# Patient Record
Sex: Female | Born: 1995 | Race: Black or African American | Hispanic: No | Marital: Married | Smoking: Never smoker
Health system: Southern US, Community
[De-identification: ages and names within clinical notes are randomized; demographics above are authoritative.]

## PROBLEM LIST (undated history)

## (undated) DIAGNOSIS — G35 Multiple sclerosis: Secondary | ICD-10-CM

## (undated) DIAGNOSIS — M419 Scoliosis, unspecified: Secondary | ICD-10-CM

## (undated) DIAGNOSIS — M722 Plantar fascial fibromatosis: Secondary | ICD-10-CM

## (undated) HISTORY — DX: Multiple sclerosis: G35

## (undated) HISTORY — PX: NO PAST SURGERIES: SHX2092

## (undated) HISTORY — DX: Plantar fascial fibromatosis: M72.2

## (undated) HISTORY — DX: Scoliosis, unspecified: M41.9

---
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
## 2015-01-15 ENCOUNTER — Emergency Department (HOSPITAL_COMMUNITY): Payer: Self-pay

## 2015-01-15 ENCOUNTER — Encounter (HOSPITAL_COMMUNITY): Payer: Self-pay | Admitting: Nurse Practitioner

## 2015-01-15 ENCOUNTER — Emergency Department (HOSPITAL_COMMUNITY)
Admission: EM | Admit: 2015-01-15 | Discharge: 2015-01-15 | Disposition: A | Discharge status: Home / Self Care | Payer: Self-pay | Attending: Emergency Medicine | Admitting: Emergency Medicine

## 2015-01-15 DIAGNOSIS — Z3202 Encounter for pregnancy test, result negative: Secondary | ICD-10-CM | POA: Insufficient documentation

## 2015-01-15 DIAGNOSIS — R109 Unspecified abdominal pain: Secondary | ICD-10-CM

## 2015-01-15 DIAGNOSIS — N390 Urinary tract infection, site not specified: Secondary | ICD-10-CM | POA: Insufficient documentation

## 2015-01-15 LAB — WET PREP, GENITAL
Clue Cells Wet Prep HPF POC: NONE SEEN
Trich, Wet Prep: NONE SEEN
Yeast Wet Prep HPF POC: NONE SEEN

## 2015-01-15 LAB — URINALYSIS, ROUTINE W REFLEX MICROSCOPIC
Bilirubin Urine: NEGATIVE
Glucose, UA: NEGATIVE mg/dL
Ketones, ur: NEGATIVE mg/dL
Leukocytes, UA: NEGATIVE
Nitrite: NEGATIVE
Protein, ur: NEGATIVE mg/dL
Specific Gravity, Urine: 1.03 (ref 1.005–1.030)
Urobilinogen, UA: 0.2 mg/dL (ref 0.0–1.0)
pH: 5.5 (ref 5.0–8.0)

## 2015-01-15 LAB — CBC
HCT: 39.9 % (ref 36.0–46.0)
Hemoglobin: 13.1 g/dL (ref 12.0–15.0)
MCH: 28 pg (ref 26.0–34.0)
MCHC: 32.8 g/dL (ref 30.0–36.0)
MCV: 85.3 fL (ref 78.0–100.0)
Platelets: 322 10*3/uL (ref 150–400)
RBC: 4.68 MIL/uL (ref 3.87–5.11)
RDW: 13.1 % (ref 11.5–15.5)
WBC: 6.1 10*3/uL (ref 4.0–10.5)

## 2015-01-15 LAB — URINE MICROSCOPIC-ADD ON

## 2015-01-15 LAB — COMPREHENSIVE METABOLIC PANEL
ALT: 12 U/L — ABNORMAL LOW (ref 14–54)
AST: 17 U/L (ref 15–41)
Albumin: 3.7 g/dL (ref 3.5–5.0)
Alkaline Phosphatase: 57 U/L (ref 38–126)
Anion gap: 3 — ABNORMAL LOW (ref 5–15)
BUN: 10 mg/dL (ref 6–20)
CO2: 28 mmol/L (ref 22–32)
Calcium: 9 mg/dL (ref 8.9–10.3)
Chloride: 107 mmol/L (ref 101–111)
Creatinine, Ser: 0.59 mg/dL (ref 0.44–1.00)
GFR calc Af Amer: >60 mL/min (ref >60)
GFR calc non Af Amer: >60 mL/min (ref >60)
Glucose, Bld: 99 mg/dL (ref 65–99)
Potassium: 3.8 mmol/L (ref 3.5–5.1)
Sodium: 138 mmol/L (ref 135–145)
Total Bilirubin: 0.6 mg/dL (ref 0.3–1.2)
Total Protein: 7.1 g/dL (ref 6.5–8.1)

## 2015-01-15 LAB — I-STAT BETA HCG BLOOD, ED (MC, WL, AP ONLY): I-stat hCG, quantitative: <5 m[IU]/mL (ref <5)

## 2015-01-15 MED ORDER — NITROFURANTOIN MONOHYD MACRO 100 MG PO CAPS: 100.0000 mg | ORAL_CAPSULE | Freq: Two times a day (BID) | ORAL | Status: DC

## 2015-01-15 MED ORDER — CIPROFLOXACIN HCL 500 MG PO TABS: 500.0000 mg | ORAL_TABLET | Freq: Two times a day (BID) | ORAL | Status: DC

## 2015-01-15 NOTE — ED Notes (Signed)
She c/o 1 week history of RLQ abd pain, nausea, diarrhea, urinary frequency and hesitancy, loss of appetite and white vaginal discharge.

## 2015-01-15 NOTE — ED Provider Notes (Signed)
CSN: [id number]     Arrival date & time 01/15/15  1147 History   First MD Initiated Contact with Patient 01/15/15 1436     Chief Complaint  Patient presents with  . Abdominal Pain     (Consider location/radiation/quality/duration/timing/severity/associated sxs/prior Treatment) HPI  History reviewed. No pertinent past medical history. History reviewed. No pertinent past surgical history. History reviewed. No pertinent family history. Social History  Substance Use Topics  . Smoking status: Never Smoker   . Smokeless tobacco: None  . Alcohol Use: No   OB History    No data available     Review of Systems    Allergies  Review of patient's allergies indicates no known allergies.  Home Medications   Prior to Admission medications   Not on File   BP 134/80 mmHg  Pulse 82  Temp(Src) 98.3 F (36.8 C) (Oral)  Resp 18  Ht  (1.626 m)  Wt 185 lb (83.915 kg)  BMI 31.74 kg/m2  SpO2 100%  LMP 01/07/2015 Physical Exam  ED Course  Procedures (including critical care time) Labs Review Labs Reviewed  COMPREHENSIVE METABOLIC PANEL - Abnormal; Notable for the following:    ALT 12 (*)    Anion gap 3 (*)    All other components within normal limits  CBC  URINALYSIS, ROUTINE W REFLEX MICROSCOPIC (NOT AT [facility])  I-STAT BETA HCG BLOOD, ED ([facility], [facility], [facility] ONLY)    Imaging Review No results found. I have personally reviewed and evaluated these images and lab results as part of my medical decision-making.   EKG Interpretation None      MDM   Final diagnoses:  None    Please delete this note I had no encounter with this patient    Doug Sou, MD 01/15/15 1655

## 2015-01-15 NOTE — ED Notes (Signed)
Denies fever but states she has been getting hot lately.  States she also has had some diarrhea

## 2015-01-15 NOTE — ED Provider Notes (Signed)
CSN: [id number]     Arrival date & time 01/15/15  1147 History   First MD Initiated Contact with Patient 01/15/15 1436     Chief Complaint  Patient presents with  . Abdominal Pain   HPI   19 YOF presents with right sided abdominal flank pain. Patient reports the symptoms have been present for the last week. She describes the pain as sharp and intermittent. She reports the pain is worse with walking. Patient reports that her urine has been normal but notes that time she's had "difficulty" with urination, this is not persistent and is only occasional; denies painful urination. Patient reports she has been nauseous, vomited once today. Patient additionally notes that she's had increase in "white" vaginal discharge; no bleeding.. She reports her last normal menstrual cycle was approximately one week ago. Patient denies fever, chills, upper abdominal pain, chest pain, shortness of breath, lower abdominal pain, lower extremity swelling or edema. Patient reports she had an episode of diarrhea today  History reviewed. No pertinent past medical history. History reviewed. No pertinent past surgical history. History reviewed. No pertinent family history. Social History  Substance Use Topics  . Smoking status: Never Smoker   . Smokeless tobacco: None  . Alcohol Use: No   OB History    No data available     Review of Systems  All other systems reviewed and are negative.   Allergies  Review of patient's allergies indicates no known allergies.  Home Medications   Prior to Admission medications   Medication Sig Start Date End Date Taking? Authorizing Provider  ciprofloxacin (CIPRO) 500 MG tablet Take 1 tablet (500 mg total) by mouth every 12 (twelve) hours. 01/15/15   Flor Whitacre, PA-C   BP 117/65 mmHg  Pulse 83  Temp(Src) 98.3 F (36.8 C) (Oral)  Resp 18  Ht  (1.626 m)  Wt 185 lb (83.915 kg)  BMI 31.74 kg/m2  SpO2 100%  LMP 01/08/2015   Physical Exam  Constitutional: She is  oriented to person, place, and time. She appears well-developed and well-nourished.  Patient resting comfortably in exam bed in no acute distress  HENT:  Head: Normocephalic and atraumatic.  Eyes: Conjunctivae are normal. Pupils are equal, round, and reactive to light. Right eye exhibits no discharge. Left eye exhibits no discharge. No scleral icterus.  Neck: Normal range of motion. No JVD present. No tracheal deviation present.  Cardiovascular: Normal rate, regular rhythm, normal heart sounds and intact distal pulses.  Exam reveals no friction rub.   No murmur heard. Pulmonary/Chest: Effort normal and breath sounds normal. No stridor. No respiratory distress. She has no wheezes. She has no rales. She exhibits no tenderness.  Abdominal: Soft. Bowel sounds are normal. She exhibits no distension and no mass. There is no hepatosplenomegaly, splenomegaly or hepatomegaly. There is tenderness. There is no rigidity, no rebound, no guarding, no CVA tenderness, no tenderness at McBurney's point and negative Murphy's sign.  Mildly tender to deep palpation of the right mid abdomen and flank  Musculoskeletal: Normal range of motion. She exhibits no edema or tenderness.  Neurological: She is alert and oriented to person, place, and time. Coordination normal.  Skin: Skin is warm and dry.  Psychiatric: She has a normal mood and affect. Her behavior is normal. Judgment and thought content normal.  Nursing note and vitals reviewed.   ED Course  Procedures (including critical care time) Labs Review Labs Reviewed  WET PREP, GENITAL - Abnormal; Notable for the following:  WBC, Wet Prep HPF POC FEW (*)    All other components within normal limits  COMPREHENSIVE METABOLIC PANEL - Abnormal; Notable for the following:    ALT 12 (*)    Anion gap 3 (*)    All other components within normal limits  URINALYSIS, ROUTINE W REFLEX MICROSCOPIC (NOT AT [facility]) - Abnormal; Notable for the following:    APPearance CLOUDY  (*)    Hgb urine dipstick MODERATE (*)    All other components within normal limits  URINE MICROSCOPIC-ADD ON - Abnormal; Notable for the following:    Squamous Epithelial / LPF MANY (*)    Bacteria, UA MANY (*)    All other components within normal limits  URINE CULTURE  CBC  RPR  HIV ANTIBODY (ROUTINE TESTING)  I-STAT BETA HCG BLOOD, ED ([facility], [facility], [facility] ONLY)  GC/CHLAMYDIA PROBE AMP ([place]) NOT AT [facility]    Imaging Review Ct Renal Stone Study  01/15/2015   CLINICAL DATA:  RIGHT flank pain for 1 week. Initial encounter.  EXAM: CT ABDOMEN AND PELVIS WITHOUT CONTRAST  TECHNIQUE: Multidetector CT imaging of the abdomen and pelvis was performed following the standard protocol without IV contrast.  COMPARISON:  None.  FINDINGS: Lower chest: Lung bases are clear.  Hepatobiliary: No focal hepatic lesion. No biliary duct dilatation. Gallbladder is normal. Common bile duct is normal.  Pancreas: Pancreas is normal. No ductal dilatation. No pancreatic inflammation.  Spleen: Normal spleen.  Adrenals/urinary tract: Adrenal glands are normal. No nephrolithiasis or ureterolithiasis. No bladder calculi.  Stomach/Bowel: Stomach, small bowel, appendix, and cecum are normal. The colon and rectosigmoid colon are normal.  Vascular/Lymphatic: Abdominal aorta is normal caliber. There is no retroperitoneal or periportal lymphadenopathy. No pelvic lymphadenopathy.  Reproductive: Uterus and ovaries are normal.  Musculoskeletal: No aggressive osseous lesion. 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 No subluxation.  Other: No free fluid.  IMPRESSION: 1. No nephrolithiasis or ureterolithiasis. 2. Normal appendix.   Electronically Signed   By: Genevive Bi M.D.   On: 01/15/2015 16:17   I have personally reviewed and evaluated these images and lab results as part of my medical decision-making.   EKG Interpretation None      MDM   Final diagnoses:  Flank pain  UTI (lower urinary tract infection)    Labs: Urine  culture, RPR, HIV, wet, urinalysis, i-STAT beta hCG, CMP, CBC- hemoglobin in the urine moderate amount, many bacteria, small urine sample size.  Imaging: CT renal panel significant findings  Consults:  Therapeutics:  Discharge Meds: Cipro  Assessment/Plan: Patient's presentation unlikely to be significant intra-abdominal pathology. Patient passing stool, unlikely bowel obstruction, CT scan shows no signs of appendicitis, kidney stones. She is nontoxic, in no acute distress, vital signs stable, laboratory results stable. Urine shows large amounts of hemoglobin, many bacteria, patient will be prophylactically treated for urinary tract infection, she is encouraged to follow up with her primary care provider for reevaluation. She is given strict return precautions event new worsening signs or symptoms present. Patient verbalized understanding and agreement for today's plan.         Eyvonne Mechanic, PA-C 01/15/15 1835  Glynn Octave, MD 01/16/15 [phone]

## 2015-01-15 NOTE — ED Notes (Signed)
Pt returned from CT °

## 2015-01-15 NOTE — ED Notes (Signed)
Patient transported to CT 

## 2015-01-15 NOTE — Discharge Instructions (Signed)
Please use medication as directed. Please monitor for new or worsening signs or symptoms, return immediately if any present. Please follow-up with primary care provider for reevaluation.

## 2015-01-15 NOTE — ED Notes (Signed)
MD and phlebotomy at bedside

## 2015-01-16 LAB — RPR: RPR Ser Ql: NONREACTIVE

## 2015-01-16 LAB — HIV ANTIBODY (ROUTINE TESTING W REFLEX): HIV Screen 4th Generation wRfx: NONREACTIVE

## 2015-01-17 LAB — URINE CULTURE: Special Requests: NORMAL

## 2015-01-17 LAB — GC/CHLAMYDIA PROBE AMP (~~LOC~~) NOT AT ARMC
Chlamydia: NEGATIVE
Neisseria Gonorrhea: NEGATIVE

## 2015-03-27 ENCOUNTER — Encounter (HOSPITAL_COMMUNITY): Payer: Self-pay | Admitting: Emergency Medicine

## 2015-03-27 ENCOUNTER — Emergency Department (HOSPITAL_COMMUNITY)
Admission: EM | Admit: 2015-03-27 | Discharge: 2015-03-27 | Disposition: A | Discharge status: Home / Self Care | Payer: Self-pay | Attending: Emergency Medicine | Admitting: Emergency Medicine

## 2015-03-27 ENCOUNTER — Emergency Department (HOSPITAL_COMMUNITY): Payer: Self-pay

## 2015-03-27 DIAGNOSIS — R197 Diarrhea, unspecified: Secondary | ICD-10-CM | POA: Insufficient documentation

## 2015-03-27 DIAGNOSIS — R110 Nausea: Secondary | ICD-10-CM | POA: Insufficient documentation

## 2015-03-27 DIAGNOSIS — N83209 Unspecified ovarian cyst, unspecified side: Secondary | ICD-10-CM

## 2015-03-27 DIAGNOSIS — R1031 Right lower quadrant pain: Secondary | ICD-10-CM

## 2015-03-27 DIAGNOSIS — N760 Acute vaginitis: Secondary | ICD-10-CM | POA: Insufficient documentation

## 2015-03-27 DIAGNOSIS — B9689 Other specified bacterial agents as the cause of diseases classified elsewhere: Secondary | ICD-10-CM

## 2015-03-27 DIAGNOSIS — N83201 Unspecified ovarian cyst, right side: Secondary | ICD-10-CM | POA: Insufficient documentation

## 2015-03-27 LAB — COMPREHENSIVE METABOLIC PANEL
ALT: 12 U/L — ABNORMAL LOW (ref 14–54)
AST: 14 U/L — ABNORMAL LOW (ref 15–41)
Albumin: 3.7 g/dL (ref 3.5–5.0)
Alkaline Phosphatase: 55 U/L (ref 38–126)
Anion gap: 6 (ref 5–15)
BUN: 14 mg/dL (ref 6–20)
CO2: 25 mmol/L (ref 22–32)
Calcium: 9 mg/dL (ref 8.9–10.3)
Chloride: 107 mmol/L (ref 101–111)
Creatinine, Ser: 0.6 mg/dL (ref 0.44–1.00)
GFR calc Af Amer: >60 mL/min (ref >60)
GFR calc non Af Amer: >60 mL/min (ref >60)
Glucose, Bld: 90 mg/dL (ref 65–99)
Potassium: 3.6 mmol/L (ref 3.5–5.1)
Sodium: 138 mmol/L (ref 135–145)
Total Bilirubin: 0.4 mg/dL (ref 0.3–1.2)
Total Protein: 6.9 g/dL (ref 6.5–8.1)

## 2015-03-27 LAB — CBC WITH DIFFERENTIAL/PLATELET
Basophils Absolute: 0 10*3/uL (ref 0.0–0.1)
Basophils Relative: 0 %
Eosinophils Absolute: 0.1 10*3/uL (ref 0.0–0.7)
Eosinophils Relative: 1 %
HCT: 39.5 % (ref 36.0–46.0)
Hemoglobin: 12.9 g/dL (ref 12.0–15.0)
Lymphocytes Relative: 18 %
Lymphs Abs: 1.7 10*3/uL (ref 0.7–4.0)
MCH: 27.6 pg (ref 26.0–34.0)
MCHC: 32.7 g/dL (ref 30.0–36.0)
MCV: 84.4 fL (ref 78.0–100.0)
Monocytes Absolute: 0.5 10*3/uL (ref 0.1–1.0)
Monocytes Relative: 6 %
Neutro Abs: 7.1 10*3/uL (ref 1.7–7.7)
Neutrophils Relative %: 75 %
Platelets: 275 10*3/uL (ref 150–400)
RBC: 4.68 MIL/uL (ref 3.87–5.11)
RDW: 12.9 % (ref 11.5–15.5)
WBC: 9.4 10*3/uL (ref 4.0–10.5)

## 2015-03-27 LAB — URINALYSIS, ROUTINE W REFLEX MICROSCOPIC
Bilirubin Urine: NEGATIVE
Glucose, UA: NEGATIVE mg/dL
Hgb urine dipstick: NEGATIVE
Ketones, ur: NEGATIVE mg/dL
Leukocytes, UA: NEGATIVE
Nitrite: NEGATIVE
Protein, ur: NEGATIVE mg/dL
Specific Gravity, Urine: 1.022 (ref 1.005–1.030)
Urobilinogen, UA: 0.2 mg/dL (ref 0.0–1.0)
pH: 8 (ref 5.0–8.0)

## 2015-03-27 LAB — WET PREP, GENITAL
Trich, Wet Prep: NONE SEEN
WBC, Wet Prep HPF POC: NONE SEEN
Yeast Wet Prep HPF POC: NONE SEEN

## 2015-03-27 LAB — GC/CHLAMYDIA PROBE AMP (~~LOC~~) NOT AT ARMC
Chlamydia: NEGATIVE
Neisseria Gonorrhea: NEGATIVE

## 2015-03-27 LAB — I-STAT BETA HCG BLOOD, ED (MC, WL, AP ONLY): I-stat hCG, quantitative: <5 m[IU]/mL (ref <5)

## 2015-03-27 LAB — LIPASE, BLOOD: Lipase: 26 U/L (ref 11–51)

## 2015-03-27 LAB — HIV ANTIBODY (ROUTINE TESTING W REFLEX): HIV Screen 4th Generation wRfx: NONREACTIVE

## 2015-03-27 MED ORDER — ONDANSETRON HCL 4 MG/2ML IJ SOLN
4.0000 mg | Freq: Once | INTRAMUSCULAR | Status: AC
  Administered 2015-03-27: 4 mg via INTRAVENOUS
  Filled 2015-03-27: qty 2

## 2015-03-27 MED ORDER — IBUPROFEN 600 MG PO TABS: 600.0000 mg | ORAL_TABLET | Freq: Four times a day (QID) | ORAL | Status: DC | PRN

## 2015-03-27 MED ORDER — HYDROCODONE-ACETAMINOPHEN 5-325 MG PO TABS: 1.0000 | ORAL_TABLET | ORAL | Status: DC | PRN

## 2015-03-27 MED ORDER — MORPHINE SULFATE (PF) 4 MG/ML IV SOLN
4.0000 mg | Freq: Once | INTRAVENOUS | Status: AC
  Administered 2015-03-27: 4 mg via INTRAVENOUS
  Filled 2015-03-27: qty 1

## 2015-03-27 MED ORDER — ONDANSETRON HCL 4 MG PO TABS: 4.0000 mg | ORAL_TABLET | Freq: Three times a day (TID) | ORAL | Status: DC | PRN

## 2015-03-27 MED ORDER — SODIUM CHLORIDE 0.9 % IV BOLUS (SEPSIS)
1000.0000 mL | Freq: Once | INTRAVENOUS | Status: AC
  Administered 2015-03-27: 1000 mL via INTRAVENOUS

## 2015-03-27 MED ORDER — METRONIDAZOLE 500 MG PO TABS: 500.0000 mg | ORAL_TABLET | Freq: Two times a day (BID) | ORAL | Status: DC

## 2015-03-27 NOTE — ED Notes (Signed)
Pt left with all her belongings and ambulated out of the treatment area.  

## 2015-03-27 NOTE — Discharge Instructions (Signed)
Bacterial Vaginosis Bacterial vaginosis is a vaginal infection that occurs when the normal balance of bacteria in the vagina is disrupted. It results from an overgrowth of certain bacteria. This is the most common vaginal infection in women of childbearing age. Treatment is important to prevent complications, especially in pregnant women, as it can cause a premature delivery. CAUSES  Bacterial vaginosis is caused by an increase in harmful bacteria that are normally present in smaller amounts in the vagina. Several different kinds of bacteria can cause bacterial vaginosis. However, the reason that the condition develops is not fully understood. RISK FACTORS Certain activities or behaviors can put you at an increased risk of developing bacterial vaginosis, including: Having a new sex partner or multiple sex partners. Douching. Using an intrauterine device (IUD) for contraception. Women do not get bacterial vaginosis from toilet seats, bedding, swimming pools, or contact with objects around them. SIGNS AND SYMPTOMS  Some women with bacterial vaginosis have no signs or symptoms. Common symptoms include: Grey vaginal discharge. A fishlike odor with discharge, especially after sexual intercourse. Itching or burning of the vagina and vulva. Burning or pain with urination. DIAGNOSIS  Your health care provider will take a medical history and examine the vagina for signs of bacterial vaginosis. A sample of vaginal fluid may be taken. Your health care provider will look at this sample under a microscope to check for bacteria and abnormal cells. A vaginal pH test may also be done.  TREATMENT  Bacterial vaginosis may be treated with antibiotic medicines. These may be given in the form of a pill or a vaginal cream. A second round of antibiotics may be prescribed if the condition comes back after treatment. Because bacterial vaginosis increases your risk for sexually transmitted diseases, getting treated can  help reduce your risk for chlamydia, gonorrhea, HIV, and herpes. HOME CARE INSTRUCTIONS  Only take over-the-counter or prescription medicines as directed by your health care provider. If antibiotic medicine was prescribed, take it as directed. Make sure you finish it even if you start to feel better. Tell all sexual partners that you have a vaginal infection. They should see their health care provider and be treated if they have problems, such as a mild rash or itching. During treatment, it is important that you follow these instructions: Avoid sexual activity or use condoms correctly. Do not douche. Avoid alcohol as directed by your health care provider. Avoid breastfeeding as directed by your health care provider. SEEK MEDICAL CARE IF:  Your symptoms are not improving after 3 days of treatment. You have increased discharge or pain. You have a fever. MAKE SURE YOU:  Understand these instructions. Will watch your condition. Will get help right away if you are not doing well or get worse. FOR MORE INFORMATION  Centers for Disease Control and Prevention, Division of STD Prevention: [employer] American Sexual Health Association (ASHA): www.ashastd.org    This information is not intended to replace advice given to you by your health care provider. Make sure you discuss any questions you have with your health care provider.   Document Released: 04/29/2005 Document Revised: 05/20/2014 Document Reviewed: 12/09/2012 Elsevier Interactive Patient Education 2016 [employer] Abdominal Pain, Adult Many things can cause abdominal pain. Usually, abdominal pain is not caused by a disease and will improve without treatment. It can often be observed and treated at home. Your health care provider will do a physical exam and possibly order blood tests and X-rays to help determine the seriousness of your pain. However, in  many cases, more time must pass before a clear cause of the pain can be found.  Before that point, your health care provider may not know if you need more testing or further treatment. HOME CARE INSTRUCTIONS Monitor your abdominal pain for any changes. The following actions may help to alleviate any discomfort you are experiencing: Only take over-the-counter or prescription medicines as directed by your health care provider. Do not take laxatives unless directed to do so by your health care provider. Try a clear liquid diet (broth, tea, or water) as directed by your health care provider. Slowly move to a bland diet as tolerated. SEEK MEDICAL CARE IF: You have unexplained abdominal pain. You have abdominal pain associated with nausea or diarrhea. You have pain when you urinate or have a bowel movement. You experience abdominal pain that wakes you in the night. You have abdominal pain that is worsened or improved by eating food. You have abdominal pain that is worsened with eating fatty foods. You have a fever. SEEK IMMEDIATE MEDICAL CARE IF: Your pain does not go away within 2 hours. You keep throwing up (vomiting). Your pain is felt only in portions of the abdomen, such as the right side or the left lower portion of the abdomen. You pass bloody or black tarry stools. MAKE SURE YOU: Understand these instructions. Will watch your condition. Will get help right away if you are not doing well or get worse.   This information is not intended to replace advice given to you by your health care provider. Make sure you discuss any questions you have with your health care provider.   Document Released: 02/06/2005 Document Revised: 01/18/2015 Document Reviewed: 01/06/2013 Elsevier Interactive Patient Education 2016 Else[employer] Inc. Ovarian Cyst An ovarian cyst is a fluid-filled sac that forms on an ovary. The ovaries are small organs that produce eggs in women. Various types of cysts can form on the ovaries. Most are not cancerous. Many do not cause problems, and they often go  away on their own. Some may cause symptoms and require treatment. Common types of ovarian cysts include:  Functional cysts--These cysts may occur every month during the menstrual cycle. This is normal. The cysts usually go away with the next menstrual cycle if the woman does not get pregnant. Usually, there are no symptoms with a functional cyst.  Endometrioma cysts--These cysts form from the tissue that lines the uterus. They are also called "chocolate cysts" because they become filled with blood that turns brown. This type of cyst can cause pain in the lower abdomen during intercourse and with your menstrual period.  Cystadenoma cysts--This type develops from the cells on the outside of the ovary. These cysts can get very big and cause lower abdomen pain and pain with intercourse. This type of cyst can twist on itself, cut off its blood supply, and cause severe pain. It can also easily rupture and cause a lot of pain.  Dermoid cysts--This type of cyst is sometimes found in both ovaries. These cysts may contain different kinds of body tissue, such as skin, teeth, hair, or cartilage. They usually do not cause symptoms unless they get very big.  Theca lutein cysts--These cysts occur when too much of a certain hormone (human chorionic gonadotropin) is produced and overstimulates the ovaries to produce an egg. This is most common after procedures used to assist with the conception of a baby (in vitro fertilization). CAUSES   Fertility drugs can cause a condition in which multiple large cysts  are formed on the ovaries. This is called ovarian hyperstimulation syndrome.  A condition called polycystic ovary syndrome can cause hormonal imbalances that can lead to nonfunctional ovarian cysts. SIGNS AND SYMPTOMS  Many ovarian cysts do not cause symptoms. If symptoms are present, they may include:  Pelvic pain or pressure.  Pain in the lower abdomen.  Pain during sexual intercourse.  Increasing girth  (swelling) of the abdomen.  Abnormal menstrual periods.  Increasing pain with menstrual periods.  Stopping having menstrual periods without being pregnant. DIAGNOSIS  These cysts are commonly found during a routine or annual pelvic exam. Tests may be ordered to find out more about the cyst. These tests may include:  Ultrasound.  X-ray of the pelvis.  CT scan.  MRI.  Blood tests. TREATMENT  Many ovarian cysts go away on their own without treatment. Your health care provider may want to check your cyst regularly for 2-3 months to see if it changes. For women in menopause, it is particularly important to monitor a cyst closely because of the higher rate of ovarian cancer in menopausal women. When treatment is needed, it may include any of the following:  A procedure to drain the cyst (aspiration). This may be done using a long needle and ultrasound. It can also be done through a laparoscopic procedure. This involves using a thin, lighted tube with a tiny camera on the end (laparoscope) inserted through a small incision.  Surgery to remove the whole cyst. This may be done using laparoscopic surgery or an open surgery involving a larger incision in the lower abdomen.  Hormone treatment or birth control pills. These methods are sometimes used to help dissolve a cyst. HOME CARE INSTRUCTIONS   Only take over-the-counter or prescription medicines as directed by your health care provider.  Follow up with your health care provider as directed.  Get regular pelvic exams and Pap tests. SEEK MEDICAL CARE IF:   Your periods are late, irregular, or painful, or they stop.  Your pelvic pain or abdominal pain does not go away.  Your abdomen becomes larger or swollen.  You have pressure on your bladder or trouble emptying your bladder completely.  You have pain during sexual intercourse.  You have feelings of fullness, pressure, or discomfort in your stomach.  You lose weight for no  apparent reason.  You feel generally ill.  You become constipated.  You lose your appetite.  You develop acne.  You have an increase in body and facial hair.  You are gaining weight, without changing your exercise and eating habits.  You think you are pregnant. SEEK IMMEDIATE MEDICAL CARE IF:   You have increasing abdominal pain.  You feel sick to your stomach (nauseous), and you throw up (vomit).  You develop a fever that comes on suddenly.  You have abdominal pain during a bowel movement.  Your menstrual periods become heavier than usual. MAKE SURE YOU:  Understand these instructions.  Will watch your condition.  Will get help right away if you are not doing well or get worse.   This information is not intended to replace advice given to you by your health care provider. Make sure you discuss any questions you have with your health care provider.   Document Released: 04/29/2005 Document Revised: 05/04/2013 Document Reviewed: 01/04/2013 Elsevier Interactive Patient Education [employer]

## 2015-03-27 NOTE — ED Notes (Signed)
Pt states that she has had lower abd pain with N/V for past two days. Pt describes pain as a cramping pain. Pt unsure if she is pregnant.

## 2015-03-27 NOTE — ED Provider Notes (Signed)
CSN: [id number]     Arrival date & time 03/27/15  0154 History   By signing my name below, I, Arlan Organ, attest that this documentation has been prepared under the direction and in the presence of Loren Racer, MD.  Electronically Signed: Arlan Organ, ED Scribe. 03/27/2015. 2:38 AM.   Chief Complaint  Patient presents with  . Abdominal Pain   The history is provided by the patient. No language interpreter was used.    HPI Comments: Kelly Benson is a 19 y.o. female without any pertinent past medical history  who presents to the Emergency Department complaining of intermittent, ongoing lower abdominal pain x 1 days; worse in last 40 minutes. Pain is described as cramping. Discomfort is made worse with certain movements. No alleviating factors at this time. 1 episode of vomiting with associated nausea along with mild vaginal discharge also reported. Ongoing diarrhea reported over the course of 1 week. States episodes typically come every morning and after every meal. Denies noting any mucous or blood in stools.  No OTC medications or home remedies attempted prior to arrival. No recent fever or chills. LNMP 10/26. No previous history of abdominal surgeries.  PCP: No PCP Per Patient    History reviewed. No pertinent past medical history. History reviewed. No pertinent past surgical history. History reviewed. No pertinent family history. Social History  Substance Use Topics  . Smoking status: Never Smoker   . Smokeless tobacco: None  . Alcohol Use: No   OB History    No data available     Review of Systems  Constitutional: Negative for fever and chills.  Respiratory: Negative for cough and shortness of breath.   Cardiovascular: Negative for chest pain.  Gastrointestinal: Positive for nausea, vomiting, abdominal pain and diarrhea. Negative for constipation and blood in stool.  Genitourinary: Positive for vaginal discharge. Negative for dysuria, frequency, flank pain, vaginal  bleeding and pelvic pain.  Musculoskeletal: Negative for back pain.  Skin: Negative for rash.  Neurological: Negative for dizziness, syncope, weakness, numbness and headaches.  Psychiatric/Behavioral: Negative for confusion.  All other systems reviewed and are negative.     Allergies  Review of patient's allergies indicates no known allergies.  Home Medications   Prior to Admission medications   Medication Sig Start Date End Date Taking? Authorizing Provider  HYDROcodone-acetaminophen (NORCO) 5-325 MG tablet Take 1-2 tablets by mouth every 4 (four) hours as needed for severe pain. 03/27/15   Loren Racer, MD  ibuprofen (ADVIL,MOTRIN) 600 MG tablet Take 1 tablet (600 mg total) by mouth every 6 (six) hours as needed. 03/27/15   Loren Racer, MD  metroNIDAZOLE (FLAGYL) 500 MG tablet Take 1 tablet (500 mg total) by mouth 2 (two) times daily. One po bid x 7 days 03/27/15   Loren Racer, MD  ondansetron (ZOFRAN) 4 MG tablet Take 1 tablet (4 mg total) by mouth every 8 (eight) hours as needed for nausea or vomiting. 03/27/15   Loren Racer, MD   Triage Vitals: BP 89/70 mmHg  Pulse 80  Temp(Src) 98.7 F (37.1 C) (Oral)  Resp 24  Ht  (1.626 m)  Wt 185 lb (83.915 kg)  BMI 31.74 kg/m2  SpO2 100%  LMP 03/08/2015 (Exact Date)   Physical Exam  Constitutional: She is oriented to person, place, and time. She appears well-developed and well-nourished. No distress.  HENT:  Head: Normocephalic and atraumatic.  Mouth/Throat: Oropharynx is clear and moist.  Eyes: EOM are normal. Pupils are equal, round, and reactive to  light.  Neck: Normal range of motion. Neck supple.  Cardiovascular: Normal rate and regular rhythm.   Pulmonary/Chest: Effort normal and breath sounds normal. No respiratory distress. She has no wheezes. She has no rales.  Abdominal: Soft. Bowel sounds are normal. She exhibits no distension and no mass. There is tenderness (patient with right lower quadrant and  right pelvic tenderness with palpation. there is no rebound or guarding.). There is no rebound and no guarding.  Genitourinary: Vaginal discharge (patient with white thick vaginal discharge.) found.  Patient has fundal and right adnexal tenderness with palpation. Cervical motion tenderness appreciated. No masses identified.  Musculoskeletal: Normal range of motion. She exhibits no edema or tenderness.  No CVA tenderness bilaterally.  Neurological: She is alert and oriented to person, place, and time.  Skin: Skin is warm and dry. No rash noted. No erythema.  Psychiatric: She has a normal mood and affect. Her behavior is normal.  Nursing note and vitals reviewed.   ED Course  Procedures (including critical care time)  DIAGNOSTIC STUDIES: Oxygen Saturation is 100% on RA, Normal by my interpretation.    COORDINATION OF CARE: 2:26 AM-Discussed treatment plan with pt at bedside and pt agreed to plan.     Labs Review Labs Reviewed  WET PREP, GENITAL - Abnormal; Notable for the following:    Clue Cells Wet Prep HPF POC MODERATE (*)    All other components within normal limits  COMPREHENSIVE METABOLIC PANEL - Abnormal; Notable for the following:    AST 14 (*)    ALT 12 (*)    All other components within normal limits  URINALYSIS, ROUTINE W REFLEX MICROSCOPIC (NOT AT [facility]) - Abnormal; Notable for the following:    APPearance CLOUDY (*)    All other components within normal limits  CBC WITH DIFFERENTIAL/PLATELET  LIPASE, BLOOD  HIV ANTIBODY (ROUTINE TESTING)  I-STAT BETA HCG BLOOD, ED ([facility], [facility], [facility] ONLY)  GC/CHLAMYDIA PROBE AMP ([place]) NOT AT [facility]    Imaging Review US Transvaginal Non-ob  03/27/2015  CLINICAL DATA:  Combined right abdominal pelvic pain. EXAM: TRANSABDOMINAL AND TRANSVAGINAL ULTRASOUND OF PELVIS TECHNIQUE: Both transabdominal and transvaginal ultrasound examinations of the pelvis were performed. Transabdominal technique was performed for global imaging of the pelvis  including uterus, ovaries, adnexal regions, and pelvic cul-de-sac. It was necessary to proceed with endovaginal exam following the transabdominal exam to visualize the ovaries and adnexa. COMPARISON:  CT 01/15/2015 FINDINGS: Uterus Measurements: 7.5 x 3.8 x 5.9 cm, measured transabdominally. No fibroids or other mass visualized. Endometrium Thickness: 17 mm.  No focal abnormality visualized. Right ovary Measurements: 3.3 x 3.6 x 2.3 cm. There is a complex cyst measuring 2.5 x 1.8 x 1.4 cm with low-level with lacy internal echoes. Complex fluid adjacent to the right ovary. Blood flow seen to the ovarian parenchyma. Left ovary Measurements: 2.9 x 1.9 x 2.3 cm. Normal appearance/no adnexal mass. Physiologic follicles and blood flow are seen. Other findings Small to moderate complex fluid in the right adnexa and pelvis. IMPRESSION: 1. Complex cyst in the right ovary measuring 2.5 cm, likely a hemorrhagic cyst. There is adjacent complex fluid in the right adnexa and pelvis, most consistent with hemorrhagic cyst rupture. 2. Borderline abnormal endometrial thickness of 17 mm. This may be normal for secretory phase of menstrual cycle. Electronically Signed   By: Rubye Oaks M.D.   On: 03/27/2015 06:00   US Pelvis Complete  03/27/2015  CLINICAL DATA:  Combined right abdominal pelvic pain. EXAM: TRANSABDOMINAL AND  TRANSVAGINAL ULTRASOUND OF PELVIS TECHNIQUE: Both transabdominal and transvaginal ultrasound examinations of the pelvis were performed. Transabdominal technique was performed for global imaging of the pelvis including uterus, ovaries, adnexal regions, and pelvic cul-de-sac. It was necessary to proceed with endovaginal exam following the transabdominal exam to visualize the ovaries and adnexa. COMPARISON:  CT 01/15/2015 FINDINGS: Uterus Measurements: 7.5 x 3.8 x 5.9 cm, measured transabdominally. No fibroids or other mass visualized. Endometrium Thickness: 17 mm.  No focal abnormality visualized. Right ovary  Measurements: 3.3 x 3.6 x 2.3 cm. There is a complex cyst measuring 2.5 x 1.8 x 1.4 cm with low-level with lacy internal echoes. Complex fluid adjacent to the right ovary. Blood flow seen to the ovarian parenchyma. Left ovary Measurements: 2.9 x 1.9 x 2.3 cm. Normal appearance/no adnexal mass. Physiologic follicles and blood flow are seen. Other findings Small to moderate complex fluid in the right adnexa and pelvis. IMPRESSION: 1. Complex cyst in the right ovary measuring 2.5 cm, likely a hemorrhagic cyst. There is adjacent complex fluid in the right adnexa and pelvis, most consistent with hemorrhagic cyst rupture. 2. Borderline abnormal endometrial thickness of 17 mm. This may be normal for secretory phase of menstrual cycle. Electronically Signed   By: Rubye Oaks M.D.   On: 03/27/2015 06:00   I have personally reviewed and evaluated these images and lab results as part of my medical decision-making.   EKG Interpretation None      MDM   Final diagnoses:  Hemorrhagic ovarian cyst  BV (bacterial vaginosis)    I personally performed the services described in this documentation, which was scribed in my presence. The recorded information has been reviewed and is accurate.   Patient presents with lower abdominal/pelvic pain. She has vaginal discharge with cervical motion tenderness. Low suspicion for appendicitis. Patient's abdomen is benign. There is no rebound or guarding. Will proceed with pelvic ultrasound and reevaluate.  On reexam patient's pain is controlled. There is no rebound or guarding. She does have some focal tenderness over the right adnexal structures. Ultrasound consistent with hemorrhagic right ovarian cyst. Patient also has evidence of bacterial vaginosis. Discussed need to follow-up with OB/GYN and return precautions given.  Loren Racer, MD 03/27/15 [id number]

## 2015-09-02 ENCOUNTER — Encounter (HOSPITAL_COMMUNITY): Payer: Self-pay | Admitting: Emergency Medicine

## 2015-09-02 ENCOUNTER — Inpatient Hospital Stay (HOSPITAL_COMMUNITY)
Admission: EM | Admit: 2015-09-02 | Discharge: 2015-09-07 | DRG: 060 | Disposition: A | Discharge status: Home / Self Care | Payer: Self-pay | Attending: Internal Medicine | Admitting: Internal Medicine

## 2015-09-02 ENCOUNTER — Emergency Department (HOSPITAL_COMMUNITY): Payer: Self-pay

## 2015-09-02 DIAGNOSIS — R402142 Coma scale, eyes open, spontaneous, at arrival to emergency department: Secondary | ICD-10-CM | POA: Diagnosis present

## 2015-09-02 DIAGNOSIS — D72829 Elevated white blood cell count, unspecified: Secondary | ICD-10-CM | POA: Diagnosis not present

## 2015-09-02 DIAGNOSIS — H53149 Visual discomfort, unspecified: Secondary | ICD-10-CM | POA: Diagnosis present

## 2015-09-02 DIAGNOSIS — H538 Other visual disturbances: Secondary | ICD-10-CM | POA: Diagnosis present

## 2015-09-02 DIAGNOSIS — G35 Multiple sclerosis: Principal | ICD-10-CM | POA: Diagnosis present

## 2015-09-02 DIAGNOSIS — T380X5A Adverse effect of glucocorticoids and synthetic analogues, initial encounter: Secondary | ICD-10-CM | POA: Diagnosis not present

## 2015-09-02 DIAGNOSIS — R531 Weakness: Secondary | ICD-10-CM

## 2015-09-02 DIAGNOSIS — R471 Dysarthria and anarthria: Secondary | ICD-10-CM | POA: Diagnosis present

## 2015-09-02 DIAGNOSIS — R402252 Coma scale, best verbal response, oriented, at arrival to emergency department: Secondary | ICD-10-CM | POA: Diagnosis present

## 2015-09-02 DIAGNOSIS — G35D Multiple sclerosis, unspecified: Secondary | ICD-10-CM

## 2015-09-02 DIAGNOSIS — R42 Dizziness and giddiness: Secondary | ICD-10-CM

## 2015-09-02 DIAGNOSIS — E876 Hypokalemia: Secondary | ICD-10-CM | POA: Diagnosis present

## 2015-09-02 DIAGNOSIS — G371 Central demyelination of corpus callosum: Secondary | ICD-10-CM

## 2015-09-02 DIAGNOSIS — Y92239 Unspecified place in hospital as the place of occurrence of the external cause: Secondary | ICD-10-CM | POA: Diagnosis not present

## 2015-09-02 DIAGNOSIS — R402362 Coma scale, best motor response, obeys commands, at arrival to emergency department: Secondary | ICD-10-CM | POA: Diagnosis present

## 2015-09-02 DIAGNOSIS — R0602 Shortness of breath: Secondary | ICD-10-CM

## 2015-09-02 LAB — URINALYSIS, ROUTINE W REFLEX MICROSCOPIC
Bilirubin Urine: NEGATIVE
Glucose, UA: NEGATIVE mg/dL
Hgb urine dipstick: NEGATIVE
Ketones, ur: NEGATIVE mg/dL
Leukocytes, UA: NEGATIVE
Nitrite: NEGATIVE
Protein, ur: NEGATIVE mg/dL
Specific Gravity, Urine: 1.015 (ref 1.005–1.030)
pH: 6 (ref 5.0–8.0)

## 2015-09-02 LAB — CBC WITH DIFFERENTIAL/PLATELET
Basophils Absolute: 0 10*3/uL (ref 0.0–0.1)
Basophils Relative: 0 %
Eosinophils Absolute: 0.1 10*3/uL (ref 0.0–0.7)
Eosinophils Relative: 1 %
HCT: 38.1 % (ref 36.0–46.0)
Hemoglobin: 13 g/dL (ref 12.0–15.0)
Lymphocytes Relative: 27 %
Lymphs Abs: 2.1 10*3/uL (ref 0.7–4.0)
MCH: 28 pg (ref 26.0–34.0)
MCHC: 34.1 g/dL (ref 30.0–36.0)
MCV: 82.1 fL (ref 78.0–100.0)
Monocytes Absolute: 0.7 10*3/uL (ref 0.1–1.0)
Monocytes Relative: 9 %
Neutro Abs: 4.9 10*3/uL (ref 1.7–7.7)
Neutrophils Relative %: 63 %
Platelets: 263 10*3/uL (ref 150–400)
RBC: 4.64 MIL/uL (ref 3.87–5.11)
RDW: 13 % (ref 11.5–15.5)
WBC: 7.8 10*3/uL (ref 4.0–10.5)

## 2015-09-02 LAB — COMPREHENSIVE METABOLIC PANEL
ALT: 11 U/L — ABNORMAL LOW (ref 14–54)
AST: 14 U/L — ABNORMAL LOW (ref 15–41)
Albumin: 4.1 g/dL (ref 3.5–5.0)
Alkaline Phosphatase: 47 U/L (ref 38–126)
Anion gap: 9 (ref 5–15)
BUN: 14 mg/dL (ref 6–20)
CO2: 22 mmol/L (ref 22–32)
Calcium: 9.1 mg/dL (ref 8.9–10.3)
Chloride: 107 mmol/L (ref 101–111)
Creatinine, Ser: 0.59 mg/dL (ref 0.44–1.00)
GFR calc Af Amer: >60 mL/min (ref >60)
GFR calc non Af Amer: >60 mL/min (ref >60)
Glucose, Bld: 97 mg/dL (ref 65–99)
Potassium: 3.4 mmol/L — ABNORMAL LOW (ref 3.5–5.1)
Sodium: 138 mmol/L (ref 135–145)
Total Bilirubin: 0.6 mg/dL (ref 0.3–1.2)
Total Protein: 7.3 g/dL (ref 6.5–8.1)

## 2015-09-02 LAB — SEDIMENTATION RATE: Sed Rate: 10 mm/hr (ref 0–22)

## 2015-09-02 LAB — TSH: TSH: 3.397 u[IU]/mL (ref 0.350–4.500)

## 2015-09-02 LAB — I-STAT BETA HCG BLOOD, ED (MC, WL, AP ONLY): I-stat hCG, quantitative: <5 m[IU]/mL (ref <5)

## 2015-09-02 MED ORDER — GADOBENATE DIMEGLUMINE 529 MG/ML IV SOLN
20.0000 mL | Freq: Once | INTRAVENOUS | Status: AC | PRN
  Administered 2015-09-02: 17 mL via INTRAVENOUS

## 2015-09-02 MED ORDER — ACETAMINOPHEN 650 MG RE SUPP: 650.0000 mg | Freq: Four times a day (QID) | RECTAL | Status: DC | PRN

## 2015-09-02 MED ORDER — SODIUM CHLORIDE 0.9 % IV SOLN: 250.0000 mL | INTRAVENOUS | Status: DC | PRN

## 2015-09-02 MED ORDER — ONDANSETRON HCL 4 MG PO TABS: 4.0000 mg | ORAL_TABLET | Freq: Four times a day (QID) | ORAL | Status: DC | PRN

## 2015-09-02 MED ORDER — SODIUM CHLORIDE 0.9 % IV SOLN: 250.0000 mg | Freq: Four times a day (QID) | INTRAVENOUS | Status: DC

## 2015-09-02 MED ORDER — SODIUM CHLORIDE 0.9% FLUSH
3.0000 mL | Freq: Two times a day (BID) | INTRAVENOUS | Status: DC
  Administered 2015-09-02 – 2015-09-06 (×7): 3 mL via INTRAVENOUS

## 2015-09-02 MED ORDER — ACETAMINOPHEN 325 MG PO TABS
650.0000 mg | ORAL_TABLET | Freq: Four times a day (QID) | ORAL | Status: DC | PRN
  Administered 2015-09-02 – 2015-09-03 (×2): 650 mg via ORAL
  Filled 2015-09-02 (×2): qty 2

## 2015-09-02 MED ORDER — PANTOPRAZOLE SODIUM 40 MG IV SOLR
40.0000 mg | INTRAVENOUS | Status: DC
  Administered 2015-09-02: 40 mg via INTRAVENOUS
  Filled 2015-09-02: qty 40

## 2015-09-02 MED ORDER — SODIUM CHLORIDE 0.9% FLUSH: 3.0000 mL | INTRAVENOUS | Status: DC | PRN

## 2015-09-02 MED ORDER — POTASSIUM CHLORIDE CRYS ER 20 MEQ PO TBCR
40.0000 meq | EXTENDED_RELEASE_TABLET | Freq: Once | ORAL | Status: AC
  Administered 2015-09-02: 40 meq via ORAL
  Filled 2015-09-02: qty 2

## 2015-09-02 MED ORDER — ONDANSETRON HCL 4 MG/2ML IJ SOLN: 4.0000 mg | Freq: Four times a day (QID) | INTRAMUSCULAR | Status: DC | PRN

## 2015-09-02 MED ORDER — METHYLPREDNISOLONE SODIUM SUCC 1000 MG IJ SOLR
250.0000 mg | Freq: Four times a day (QID) | INTRAMUSCULAR | Status: DC
  Administered 2015-09-02 – 2015-09-07 (×20): 250 mg via INTRAVENOUS
  Filled 2015-09-02 (×25): qty 2

## 2015-09-02 MED ORDER — PANTOPRAZOLE SODIUM 40 MG IV SOLR: 40.0000 mg | INTRAVENOUS | Status: DC

## 2015-09-02 NOTE — ED Notes (Signed)
I had taken her to MRI; and she has been back for ~20 min.  She has just seen Dr. Donnald Garre, who discussed findings/plan.  She remains awake, alert and in no distress.

## 2015-09-02 NOTE — H&P (Signed)
[facility] History and Physical  LILYBETH VIEN WNU:[id number] DOB: [birth year] DOA: 09/02/2015  Referring physician: Dr. Arby Barrette, EDP PCP: No PCP Per Patient  Specialists: None Patient coming from: home  Chief Complaint: Blurry vision, weakness  HPI: Kelly Benson is a healthy 20 y.o. female, with no past medical history, who presented to the emergency department with complaints of dizziness, blurry vision, and weakness. Symptoms started approximately 1-1/2 months ago. Patient noted that she had more weakness in her right upper extremity and was having more dizziness. Her vision has been blurry. She has not seen an ophthalmologist or at night exam in many years. Over the last couple of days, she has been more unsteady on her feet and has noted that her dizziness has become more of a vertigo type feeling. Chest reveals that she's been speaking slowly. Patient works at [employer] and is on her feet constantly, feels that this has been affecting her job. She denies any recent illness or travel. Nothing seems to make her sometimes worse or better. Patient denies any headaches, chest pain, shortness of breath, abdominal pain, nausea or vomiting. She denies using any medications, smoking, alcohol, or drug use.   ED Course: MRI obtained showing findings consistent with MS. Neurology consulted, recommended [facility] admission, solumedrol and protonix.  Review of Systems:  As per HPI otherwise 10 point review of systems negative.   History reviewed. No pertinent past medical history.  History reviewed. No pertinent past surgical history.  Social History:  reports that she has never smoked. She does not have any smokeless tobacco history on file. She reports that she does not drink alcohol or use illicit drugs.   No Known Allergies  Family History  Problem Relation Age of Onset  . Hypertension Father    Prior to Admission medications   Not on File    Physical Exam: Filed  Vitals:   09/02/15 0853 09/02/15 1106  BP: 130/86 108/60  Pulse: 62 63  Temp: 98.2 F (36.8 C)   Resp: 16 18     General: Well developed, well nourished, NAD, appears stated age  HEENT: NCAT, PERRLA, EOMI, Anicteic Sclera, mucous membranes moist.   Neck: Supple, no JVD, no masses  Cardiovascular: S1 S2 auscultated, no rubs, murmurs or gallops. Regular rate and rhythm.  Respiratory: Clear to auscultation bilaterally   Abdomen: Soft, nontender, nondistended, + bowel sounds  Extremities: warm dry without cyanosis clubbing or edema  Neuro: AAOx3, cranial nerves grossly intact. Strength 5/5 in patient's upper and lower extremities bilaterally  Skin: Without rashes exudates or nodules, multiple tattoos   Psych: Normal affect and demeanor with intact judgement and insight  Labs on Admission: I have personally reviewed following labs and imaging studies CBC:  Recent Labs Lab 09/02/15 0920  WBC 7.8  NEUTROABS 4.9  HGB 13.0  HCT 38.1  MCV 82.1  PLT 263   Basic Metabolic Panel:  Recent Labs Lab 09/02/15 0920  NA 138  K 3.4*  CL 107  CO2 22  GLUCOSE 97  BUN 14  CREATININE 0.59  CALCIUM 9.1   GFR: CrCl cannot be calculated (Unknown ideal weight.). Liver Function Tests:  Recent Labs Lab 09/02/15 0920  AST 14*  ALT 11*  ALKPHOS 47  BILITOT 0.6  PROT 7.3  ALBUMIN 4.1   No results for input(s): LIPASE, AMYLASE in the last 168 hours. No results for input(s): AMMONIA in the last 168 hours. Coagulation Profile: No results for input(s): INR, PROTIME in the last  168 hours. Cardiac Enzymes: No results for input(s): CKTOTAL, CKMB, CKMBINDEX, TROPONINI in the last 168 hours. BNP (last 3 results) No results for input(s): PROBNP in the last 8760 hours. HbA1C: No results for input(s): HGBA1C in the last 72 hours. CBG: No results for input(s): GLUCAP in the last 168 hours. Lipid Profile: No results for input(s): CHOL, HDL, LDLCALC, TRIG, CHOLHDL, LDLDIRECT in  the last 72 hours. Thyroid Function Tests:  Recent Labs  09/02/15 0920  TSH 3.397   Anemia Panel: No results for input(s): VITAMINB12, FOLATE, FERRITIN, TIBC, IRON, RETICCTPCT in the last 72 hours. Urine analysis:    Component Value Date/Time   COLORURINE YELLOW 09/02/2015 0936   APPEARANCEUR CLEAR 09/02/2015 0936   LABSPEC 1.015 09/02/2015 0936   PHURINE 6.0 09/02/2015 0936   GLUCOSEU NEGATIVE 09/02/2015 0936   HGBUR NEGATIVE 09/02/2015 0936   BILIRUBINUR NEGATIVE 09/02/2015 0936   KETONESUR NEGATIVE 09/02/2015 0936   PROTEINUR NEGATIVE 09/02/2015 0936   UROBILINOGEN 0.2 03/27/2015 0306   NITRITE NEGATIVE 09/02/2015 0936   LEUKOCYTESUR NEGATIVE 09/02/2015 0936   Sepsis Labs: (procalcitonin:4,lacticidven:4) )No results found for this or any previous visit (from the past 240 hour(s)).   Radiological Exams on Admission: Mr Lodema Pilot Contrast  09/02/2015  CLINICAL DATA:  20 year old female with intermittent dizziness. Right eye blurred vision. Right upper extremity weakness. Speech difficulty. Initial encounter. EXAM: MRI HEAD WITHOUT AND WITH CONTRAST TECHNIQUE: Multiplanar, multiecho pulse sequences of the brain and surrounding structures were obtained without and with intravenous contrast. CONTRAST:  17mL MULTIHANCE GADOBENATE DIMEGLUMINE 529 MG/ML IV SOLN COMPARISON:  None. FINDINGS: Cerebral volume is within normal limits. No restricted diffusion to suggest acute infarction. No midline shift, mass effect, evidence of mass lesion, ventriculomegaly, extra-axial collection or acute intracranial hemorrhage. Cervicomedullary junction and pituitary are within normal limits. Major intracranial vascular flow voids are within normal limits. Fairly widespread patchy and nodular bilateral cerebral white matter T2 and FLAIR hyperintensity. Bilateral temporal lobe involvement. Patchy corpus callosum involvement, primarily in the body. Some of the largest periventricular lesions are  oriented perpendicular to the ventricles. There is patchy involvement in the midbrain and periaqueductal gray matter. There is nodular involvement in the posterior medulla (series 8, image 6) and also in the right cerebellum. None of these lesions appear restricted. However, 2 posterior left hemisphere lesions are enhancing. The larger is rim enhancing (series 14 image 13). No associated mass effect or surrounding edema. Grossly negative visualized cervical spinal cord. 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 No definite abnormal signal in the optic nerves. Other orbits soft tissues appear normal. Negative scalp soft tissues. Visualized bone marrow signal is within normal limits. IMPRESSION: 1. Constellation of findings most compatible with demyelinating disease, favor Multiple Sclerosis. Two enhancing white matter lesions in the left hemisphere compatible with acute demyelination. 2. 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 Electronically Signed   By: Odessa Fleming M.D.   On: 09/02/2015 11:27    EKG: none  Assessment/Plan Right arm weakness/Dizziness/Blurred vision, suspect Multiple Sclerosis -MRI brain shows Constellation of findings most compatible with demyelinating disease favor multiple sclerosis -EDP consulted Dr. Lavon Paganini (neurology) who would like patient transferred to [facility]. -Recommended Solu-Medrol 250 mg every 6 hours, Protonix  Hypokalemia -Will replace and continue to monitor BMP  DVT prophylaxis: SCDs  Code Status: Full  Family Communication: None at bedside. Admission, patients condition and plan of care including tests being ordered have been discussed with the patient, who  indicates understanding and agrees with the plan and Code Status.  Disposition Plan: Admitted to [facility]  Consults called: Neurology, Dr. Lavon Paganini   Admission status: Inpatient    Time spent: 50 minutes  Havier Deeb,  Jayden Kratochvil D.O. [facility] Pager [phone]  If 7PM-7AM, please contact night-coverage [email or web address] Password [facility] 09/02/2015, 12:05 PM

## 2015-09-02 NOTE — ED Notes (Signed)
Kelly Benson from [facility] is here speaking and prepping pt. As I write this.  Pt. Remains in no distress.

## 2015-09-02 NOTE — Consult Note (Addendum)
Neurology Consultation Reason for Consult: MS Referring Physician: ED  CC: dysarthria and right sided clumsiness  History is obtained from patient  HPI: Kelly Benson is a 20 y.o. female without a PMH until 1y ago when she woke up one morning with a list that never went away.  Then developed clumsiness of right hand and leg.  Occassional difficulty walking.  Symptoms are worse when the climate is hot.  Occasionally will have photophobia and when light is too bright her eyes will go "crossed eyed".  Denies urinarly probles, diplopia, blurred vision, trouble walking. Denies left sided sx. Denies fevers chills nuchal rigidity. Has lost a little weight.   ROS: A 14 point ROS was performed and is negative except as noted in the HPI.  History reviewed. No pertinent past medical history.  Family History  Problem Relation Age of Onset  . Hypertension Father    Social History:  reports that she has never smoked. She does not have any smokeless tobacco history on file. She reports that she does not drink alcohol or use illicit drugs.   Exam: Current vital signs: BP 124/108 mmHg  Pulse 111  Temp(Src) 99 F (37.2 C) (Oral)  Resp 18  Ht  (1.626 m)  Wt 76.4 kg (168 lb 6.9 oz)  BMI 28.90 kg/m2  SpO2 100%  LMP 07/30/2015 Vital signs in last 24 hours: Temp:  [98.2 F (36.8 C)-99 F (37.2 C)] 99 F (37.2 C) (04/22 1723) Pulse Rate:  [62-111] 111 (04/22 1723) Resp:  [16-20] 18 (04/22 1723) BP: (108-130)/(60-108) 124/108 mmHg (04/22 1723) SpO2:  [100 %] 100 % (04/22 1723) Weight:  [76.4 kg (168 lb 6.9 oz)] 76.4 kg (168 lb 6.9 oz) (04/22 1343)   Physical Exam  Constitutional: Appears well-developed and well-nourished.  Psych: Affect appropriate to situation Eyes: No scleral injection HENT: No OP obstrucion Head: Normocephalic.  Cardiovascular: Normal rate and regular rhythm.  Respiratory: Effort normal and breath sounds normal to anterior ascultation GI: Soft.  No  distension. There is no tenderness.  Skin: WDI  Neuro: Mental Status: Patient is awake, alert, oriented to person, place, month, year, and situation Patient is able to give a clear and coherent history. No signs of aphasia or neglect Cranial Nerves: II: Visual Fields are full. Pupils are equal, round, and reactive to light.  No apd III,IV, VI: EOMI without ptosis or diploplia.  V: Facial sensation is symmetric to temperature VII: flattened NL fold on right VIII: hearing is intact to voice X: Uvula elevates symmetrically XI: Shoulder shrug is symmetric. XII: tongue is midline without atrophy or fasciculations.  Motor: Tone is normal. Bulk is normal. 5/5 strength was present in all four extremities. Sensory: Missed her nose if she tries to touch it with eyes closed. Denies sensory loss Deep Tendon Reflexes: 3+ and symmetric in bil patellae.  Right biceps 3+ left 2+ Plantars: Toes are downgoing bilaterally.  Cerebellar: FNF and HKS are intact bilaterally    I have reviewed labs in epic and the results pertinent to this consultation are: all normal  I have reviewed the images obtained: MRI brain is suggestive of MS  Impression: Likely MS - could be primary progressive.  Will attempt to determine this with MRI C and T spine. Recommendations: 1) 1g daily solumedrol x 5d - Dr Gasper Lloyd has already ordered doses of 250q6h.  He is the day consultant and thus will leave his orders unchanged.  Patient needs social work or Surveyor, mining regarding MS  meds and MS follow up/insurance.  Will place order for case manager consult.  PT/OT/ST

## 2015-09-02 NOTE — ED Notes (Addendum)
Pt reports intermittent dizziness (room spinning vs syncope), R eye blurred vision, R arm/hand weakness, difficulty forming words, and bilateral leg discomfort (feels like they are moving). Symptoms worse at work, though pt having symptoms today. No arm drift. No slurred speech in triage.

## 2015-09-02 NOTE — ED Notes (Signed)
I have just phoned report to Ashok Cordia, RN at [facility]; and will call [facility] now for transport.

## 2015-09-02 NOTE — ED Provider Notes (Signed)
CSN: [id number]     Arrival date & time 09/02/15  0845 History   First MD Initiated Contact with Patient 09/02/15 [phone]     Chief Complaint  Patient presents with  . Dizziness  . Extremity Weakness     (Consider location/radiation/quality/duration/timing/severity/associated sxs/prior Treatment) HPI  For 1-2 months the patient has been experiencing variable weakness and dizziness. This has been episodic but is now worse and more consistent for the past week. Most prominent symptom patient noticed early was weakness of her right hand. As an example, she might recheck up to adjust her glasses and hit herself in the face due to lack of control of the right hand. She also experienced intermittent dizziness of a vertiginous quality. She reports that the gait also sometimes seemed more unsteady. She felt that this focused more to the right lower extremity than the left, but to a degree, it is both extremities. She reports sometimes it feels like she has to either walk too fast or too slowly. She also notes that she has had blurring of the vision. She states that she has not had her eyeglass prescription updated for 4 years. At her current presentation, the symptoms are not significantly present. She notes however this is interfering with her work. 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 She denies that headaches are a prominent symptom. She reports sometimes she has a mild headache in association with symptoms. Patient denies any symptoms of recent illness such as fever, chills, nasal congestion, cough, abdominal pain, rashes, extremity swelling. Patient is otherwise healthy without medical past history. She denies that she takes any prescribed or over-the-counter medications.  Family history: Father hypertension, mother intermittent hypoglycemia. 5 healthy siblings. History reviewed. No pertinent past medical  history. History reviewed. No pertinent past surgical history. Family History  Problem Relation Age of Onset  . Hypertension Father    Social History  Substance Use Topics  . Smoking status: Never Smoker   . Smokeless tobacco: None  . Alcohol Use: No   OB History    No data available     Review of Systems  10 Systems reviewed and are negative for acute change except as noted in the HPI.   Allergies  Review of patient's allergies indicates no known allergies.  Home Medications   Prior to Admission medications   Not on File   BP 108/60 mmHg  Pulse 63  Temp(Src) 98.2 F (36.8 C) (Oral)  Resp 18  SpO2 100%  LMP 07/30/2015 Physical Exam  Constitutional: She is oriented to person, place, and time. She appears well-developed and well-nourished.  HENT:  Head: Normocephalic and atraumatic.  Right Ear: External ear normal.  Left Ear: External ear normal.  Nose: Nose normal.  Mouth/Throat: Oropharynx is clear and moist.  Bilateral TMs are normal. Posterior oropharynx widely patent. Dentition excellent condition.  Eyes: EOM are normal. Pupils are equal, round, and reactive to light.  Neck: Neck supple. No tracheal deviation present. No thyromegaly present.  Cardiovascular: Normal rate, regular rhythm, normal heart sounds and intact distal pulses.   Pulmonary/Chest: Effort normal and breath sounds normal.  Abdominal: Soft. Bowel sounds are normal. She exhibits no distension. There is no tenderness.  Musculoskeletal: Normal range of motion. She exhibits no edema or tenderness.  Lymphadenopathy:    She has no cervical adenopathy.  Neurological: She is alert and oriented to person, place, and time.  She has normal strength. No cranial nerve deficit. She exhibits normal muscle tone. Coordination normal. GCS eye subscore is 4. GCS verbal subscore is 5. GCS motor subscore is 6.  Normal heel shin examination. Normal pupillary light response. No subjective deficit to light touch 4  extremities. Motor strength upper and lower 4 out of 4. Cognitive function is normal. Speech clear.  Skin: Skin is warm, dry and intact.  Psychiatric: She has a normal mood and affect.    ED Course  Procedures (including critical care time) Labs Review Labs Reviewed  COMPREHENSIVE METABOLIC PANEL - Abnormal; Notable for the following:    Potassium 3.4 (*)    AST 14 (*)    ALT 11 (*)    All other components within normal limits  CBC WITH DIFFERENTIAL/PLATELET  URINALYSIS, ROUTINE W REFLEX MICROSCOPIC (NOT AT [facility])  TSH  SEDIMENTATION RATE  I-STAT BETA HCG BLOOD, ED ([facility], [facility], [facility] ONLY)    Imaging Review Mr Lodema Pilot Contrast  09/02/2015  CLINICAL DATA:  20 year old female with intermittent dizziness. Right eye blurred vision. Right upper extremity weakness. Speech difficulty. Initial encounter. EXAM: MRI HEAD WITHOUT AND WITH CONTRAST TECHNIQUE: Multiplanar, multiecho pulse sequences of the brain and surrounding structures were obtained without and with intravenous contrast. CONTRAST:  17mL MULTIHANCE GADOBENATE DIMEGLUMINE 529 MG/ML IV SOLN COMPARISON:  None. FINDINGS: Cerebral volume is within normal limits. No restricted diffusion to suggest acute infarction. No midline shift, mass effect, evidence of mass lesion, ventriculomegaly, extra-axial collection or acute intracranial hemorrhage. Cervicomedullary junction and pituitary are within normal limits. Major intracranial vascular flow voids are within normal limits. Fairly widespread patchy and nodular bilateral cerebral white matter T2 and FLAIR hyperintensity. Bilateral temporal lobe involvement. Patchy corpus callosum involvement, primarily in the body. Some of the largest periventricular lesions are oriented perpendicular to the ventricles. There is patchy involvement in the midbrain and periaqueductal gray matter. There is nodular involvement in the posterior medulla (series 8, image 6) and also in the right cerebellum. None of these  lesions appear restricted. However, 2 posterior left hemisphere lesions are enhancing. The larger is rim enhancing (series 14 image 13). No associated mass effect or surrounding edema. Grossly negative visualized cervical spinal cord. 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 No definite abnormal signal in the optic nerves. Other orbits soft tissues appear normal. Negative scalp soft tissues. Visualized bone marrow signal is within normal limits. IMPRESSION: 1. Constellation of findings most compatible with demyelinating disease, favor Multiple Sclerosis. Two enhancing white matter lesions in the left hemisphere compatible with acute demyelination. 2. 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 Electronically Signed   By: Odessa Fleming M.D.   On: 09/02/2015 11:27   I have personally reviewed and evaluated these images and lab results as part of my medical decision-making.   EKG Interpretation None     Consult: Patient's case reviewed with Dr. Lavon Paganini. She will be admitted for IV Solu-Medrol. Consult: Reviewed with [facility] hospitalist for admission. MDM   Final diagnoses:  Multiple sclerosis (HCC)   Patient presents with no past medical history. She is otherwise healthy. No drug or alcohol use and benign family history. For 1-2 months patient has been experiencing variable neurologic symptoms of weakness and dizziness with blurred vision. Review of systems is otherwise negative. MRI is very suggestive of multiple sclerosis with demyelinating lesions. Per consultation with Dr. Lavon Paganini, patient will be admitted for IV Solu-Medrol.    Arby Barrette, MD 09/02/15 [phone]

## 2015-09-03 ENCOUNTER — Encounter (HOSPITAL_COMMUNITY): Payer: Self-pay | Admitting: *Deleted

## 2015-09-03 ENCOUNTER — Inpatient Hospital Stay (HOSPITAL_COMMUNITY): Payer: Self-pay

## 2015-09-03 DIAGNOSIS — G35 Multiple sclerosis: Principal | ICD-10-CM

## 2015-09-03 DIAGNOSIS — G371 Central demyelination of corpus callosum: Secondary | ICD-10-CM | POA: Insufficient documentation

## 2015-09-03 LAB — CBC
HCT: 38.7 % (ref 36.0–46.0)
Hemoglobin: 12.9 g/dL (ref 12.0–15.0)
MCH: 28 pg (ref 26.0–34.0)
MCHC: 33.3 g/dL (ref 30.0–36.0)
MCV: 83.9 fL (ref 78.0–100.0)
Platelets: 291 10*3/uL (ref 150–400)
RBC: 4.61 MIL/uL (ref 3.87–5.11)
RDW: 13 % (ref 11.5–15.5)
WBC: 13.9 10*3/uL — ABNORMAL HIGH (ref 4.0–10.5)

## 2015-09-03 LAB — COMPREHENSIVE METABOLIC PANEL
ALT: 11 U/L — ABNORMAL LOW (ref 14–54)
AST: 14 U/L — ABNORMAL LOW (ref 15–41)
Albumin: 3.6 g/dL (ref 3.5–5.0)
Alkaline Phosphatase: 47 U/L (ref 38–126)
Anion gap: 12 (ref 5–15)
BUN: 10 mg/dL (ref 6–20)
CO2: 20 mmol/L — ABNORMAL LOW (ref 22–32)
Calcium: 9.2 mg/dL (ref 8.9–10.3)
Chloride: 108 mmol/L (ref 101–111)
Creatinine, Ser: 0.58 mg/dL (ref 0.44–1.00)
GFR calc Af Amer: >60 mL/min (ref >60)
GFR calc non Af Amer: >60 mL/min (ref >60)
Glucose, Bld: 123 mg/dL — ABNORMAL HIGH (ref 65–99)
Potassium: 3.8 mmol/L (ref 3.5–5.1)
Sodium: 140 mmol/L (ref 135–145)
Total Bilirubin: 0.5 mg/dL (ref 0.3–1.2)
Total Protein: 7 g/dL (ref 6.5–8.1)

## 2015-09-03 MED ORDER — GADOBENATE DIMEGLUMINE 529 MG/ML IV SOLN
15.0000 mL | Freq: Once | INTRAVENOUS | Status: AC | PRN
  Administered 2015-09-03: 15 mL via INTRAVENOUS

## 2015-09-03 MED ORDER — PANTOPRAZOLE SODIUM 40 MG PO TBEC
40.0000 mg | DELAYED_RELEASE_TABLET | Freq: Every day | ORAL | Status: DC
  Administered 2015-09-03 – 2015-09-07 (×5): 40 mg via ORAL
  Filled 2015-09-03 (×5): qty 1

## 2015-09-03 NOTE — Progress Notes (Signed)
Interval History:                                                                                                                      Kelly Benson is an 20 y.o. female patient with newly diagnosed MS, based on abnormal brain MRI showing extensive demyelinating lesions, two of which enhance in left frontal and parietal white matter.    She had C and T spine MRI's done, which showed chronic demyelinating lesions in cervical cord.   On iv solumedrol, doing well. Reports improved neuro symptoms.    Past Medical History: History reviewed. No pertinent past medical history.  History reviewed. No pertinent past surgical history.  Family History: Family History  Problem Relation Age of Onset  . Hypertension Father     Social History:   reports that she has never smoked. She does not have any smokeless tobacco history on file. She reports that she does not drink alcohol or use illicit drugs.  Allergies:  No Known Allergies   Medications:                                                                                                                         Current facility-administered medications:  .  0.9 %  sodium chloride infusion, 250 mL, Intravenous, PRN, Maryann Mikhail, DO .  acetaminophen (TYLENOL) tablet 650 mg, 650 mg, Oral, Q6H PRN, 650 mg at 09/03/15 1033 **OR** acetaminophen (TYLENOL) suppository 650 mg, 650 mg, Rectal, Q6H PRN, Maryann Mikhail, DO .  methylPREDNISolone sodium succinate (SOLU-MEDROL) 250 mg in sodium chloride 0.9 % 50 mL IVPB, 250 mg, Intravenous, Q6H, Arby Barrette, MD, 250 mg at 09/03/15 2100 .  ondansetron (ZOFRAN) tablet 4 mg, 4 mg, Oral, Q6H PRN **OR** ondansetron (ZOFRAN) injection 4 mg, 4 mg, Intravenous, Q6H PRN, Maryann Mikhail, DO .  pantoprazole (PROTONIX) EC tablet 40 mg, 40 mg, Oral, Daily, Bertram Millard, RPH, 40 mg at 09/03/15 1430 .  sodium chloride flush (NS) 0.9 % injection 3 mL, 3 mL, Intravenous, Q12H, Maryann Mikhail, DO, 3 mL at  09/03/15 2136 .  sodium chloride flush (NS) 0.9 % injection 3 mL, 3 mL, Intravenous, PRN, Edsel Petrin, DO   Neurologic Examination:  Today's Vitals   09/03/15 0958 09/03/15 1133 09/03/15 1749 09/03/15 2231  BP: 122/58  122/59 116/74  Pulse: 88  90 74  Temp: 97.9 F (36.6 C)  98.3 F (36.8 C) 98.3 F (36.8 C)  TempSrc: Oral  Oral Oral  Resp: Height:      Weight:      SpO2: 99%  100% 100%  PainSc:  3       Evaluation of higher integrative functions including: Level of alertness: Alert,  Oriented to time, place and person Speech: fluent, no evidence of dysarthria or aphasia noted.  Test the following cranial nerves: 2-12 grossly intact Motor examination: Normal tone, bulk, full 5/5 motor strength in all 4 extremities, except mild right deltoid weakness.  Test coordination: Normal finger nose testing, with no evidence of limb appendicular ataxia or abnormal involuntary movements or tremors noted.    Lab Results: Basic Metabolic Panel:  Recent Labs Lab 09/02/15 0920 09/03/15 0419  NA 138 140  K 3.4* 3.8  CL 107 108  CO2 22 20*  GLUCOSE 97 123*  BUN 14 10  CREATININE 0.59 0.58  CALCIUM 9.1 9.2    Liver Function Tests:  Recent Labs Lab 09/02/15 0920 09/03/15 0419  AST 14* 14*  ALT 11* 11*  ALKPHOS 47 47  BILITOT 0.6 0.5  PROT 7.3 7.0  ALBUMIN 4.1 3.6   No results for input(s): LIPASE, AMYLASE in the last 168 hours. No results for input(s): AMMONIA in the last 168 hours.  CBC:  Recent Labs Lab 09/02/15 0920 09/03/15 0419  WBC 7.8 13.9*  NEUTROABS 4.9  --   HGB 13.0 12.9  HCT 38.1 38.7  MCV 82.1 83.9  PLT 263 291    Cardiac Enzymes: No results for input(s): CKTOTAL, CKMB, CKMBINDEX, TROPONINI in the last 168 hours.  Lipid Panel: No results for input(s): CHOL, TRIG, HDL, CHOLHDL, VLDL, LDLCALC in the last 168 hours.  CBG: No  results for input(s): GLUCAP in the last 168 hours.  Microbiology: Results for orders placed or performed during the hospital encounter of 03/27/15  Wet prep, genital     Status: Abnormal   Collection Time: 03/27/15  4:18 AM  Result Value Ref Range Status   Yeast Wet Prep HPF POC NONE SEEN NONE SEEN Final   Trich, Wet Prep NONE SEEN NONE SEEN Final   Clue Cells Wet Prep HPF POC MODERATE (A) NONE SEEN Final   WBC, Wet Prep HPF POC NONE SEEN NONE SEEN Final    Imaging: Mr Lodema Pilot Contrast  09/02/2015  CLINICAL DATA:  20 year old female with intermittent dizziness. Right eye blurred vision. Right upper extremity weakness. Speech difficulty. Initial encounter. EXAM: MRI HEAD WITHOUT AND WITH CONTRAST TECHNIQUE: Multiplanar, multiecho pulse sequences of the brain and surrounding structures were obtained without and with intravenous contrast. CONTRAST:  17mL MULTIHANCE GADOBENATE DIMEGLUMINE 529 MG/ML IV SOLN COMPARISON:  None. FINDINGS: Cerebral volume is within normal limits. No restricted diffusion to suggest acute infarction. No midline shift, mass effect, evidence of mass lesion, ventriculomegaly, extra-axial collection or acute intracranial hemorrhage. Cervicomedullary junction and pituitary are within normal limits. Major intracranial vascular flow voids are within normal limits. Fairly widespread patchy and nodular bilateral cerebral white matter T2 and FLAIR hyperintensity. Bilateral temporal lobe involvement. Patchy corpus callosum involvement, primarily in the body. Some of the largest periventricular lesions are oriented perpendicular to the ventricles. There is patchy involvement in the midbrain and periaqueductal gray matter. There is nodular  involvement in the posterior medulla (series 8, image 6) and also in the right cerebellum. None of these lesions appear restricted. However, 2 posterior left hemisphere lesions are enhancing. The larger is rim enhancing (series 14 image 13). No  associated mass effect or surrounding edema. Grossly negative visualized cervical spinal cord. 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 No definite abnormal signal in the optic nerves. Other orbits soft tissues appear normal. Negative scalp soft tissues. Visualized bone marrow signal is within normal limits. IMPRESSION: 1. Constellation of findings most compatible with demyelinating disease, favor Multiple Sclerosis. Two enhancing white matter lesions in the left hemisphere compatible with acute demyelination. 2. 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 Electronically Signed   By: Odessa Fleming M.D.   On: 09/02/2015 11:27   Mr Cervical Spine W Wo Contrast  09/03/2015  CLINICAL DATA:  20 year old female with intermittent dizziness. Right eye blurred vision. Right upper extremity weakness. Speech difficulty. Evidence of multiple sclerosis with active demyelination on brain MRI yesterday. Initial encounter. EXAM: MRI THORACIC AND CERVICAL SPINE WITHOUT AND WITH CONTRAST TECHNIQUE: Multiplanar and multiecho pulse sequences of the thoracic and cervical spine were obtained without and with intravenous contrast. CONTRAST:  42mL MULTIHANCE GADOBENATE DIMEGLUMINE 529 MG/ML IV SOLN COMPARISON:  Brain MRI 09/02/2015. 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 Mild reversal of cervical lordosis. No acute osseous abnormality identified. There is a small circumscribed and benign appearing T2 hyperintense and enhancing focus of marrow signal in the right C6 inferior articulating facet (series 9, image 16). This appears to be inconsequential. Abnormal increased STIR signal with mild expansion in the central and dorsal cervical spinal cord at C2-C3 (series 6, image 8 and series 8, image 36). No associated enhancement. The lesion appears more eccentric to the right. Questionable small central cord lesion at  C5 seen only on series 8, image 24. No other cervical spinal cord lesion. No cervical spinal cord enhancement. No abnormal intradural enhancement in this segment of the spine. No superimposed cervical spine degeneration, spinal stenosis, or foraminal stenosis. Negative visualized neck soft tissues. MR THORACIC SPINE FINDINGS Normal thoracic vertebral height and alignment. No marrow edema or evidence of acute osseous abnormality. Evidence of small ventral spinal cord lesion at T1-T2 again noted. No associated cord expansion or enhancement. No other thoracic spinal cord signal abnormality. No abnormal thoracic spinal cord enhancement or intradural enhancement. No thoracic spine degeneration. Capacious spinal canal. No spinal stenosis. The conus medullaris appears normal at T12-L1. Negative visualized posterior paraspinal soft tissues. Negative visualized thoracic and upper abdominal viscera. IMPRESSION: 1. Cervical spinal cord demyelinating lesion at C2-C3 with mild cord expansion, but no associated enhancement suggesting this is subacute. 2. Questionable small nonenhancing central cord lesion at C5, and small nonenhancing ventral cord lesion at T1-T2. Elsewhere the spinal cord is normal. 3. No spinal stenosis. 4. Congenital incomplete segmentation of C2-C3. Otherwise no significant osseous abnormality in the cervical or thoracic spine; there is a tiny focus of nonspecific marrow signal change in the right C6 facet which appears to be benign and inconsequential. Electronically Signed   By: Odessa Fleming M.D.   On: 09/03/2015 14:16   Mr Thoracic Spine W Wo Contrast  09/03/2015  CLINICAL DATA:  20 year old female with intermittent dizziness. Right eye blurred vision. Right upper extremity weakness. Speech difficulty. Evidence of multiple sclerosis with active demyelination on brain MRI yesterday. Initial encounter. EXAM: MRI THORACIC AND CERVICAL SPINE WITHOUT AND WITH  CONTRAST TECHNIQUE: Multiplanar and multiecho pulse  sequences of the thoracic and cervical spine were obtained without and with intravenous contrast. CONTRAST:  15mL MULTIHANCE GADOBENATE DIMEGLUMINE 529 MG/ML IV SOLN COMPARISON:  Brain MRI 09/02/2015. 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 Mild reversal of cervical lordosis. No acute osseous abnormality identified. There is a small circumscribed and benign appearing T2 hyperintense and enhancing focus of marrow signal in the right C6 inferior articulating facet (series 9, image 16). This appears to be inconsequential. Abnormal increased STIR signal with mild expansion in the central and dorsal cervical spinal cord at C2-C3 (series 6, image 8 and series 8, image 36). No associated enhancement. The lesion appears more eccentric to the right. Questionable small central cord lesion at C5 seen only on series 8, image 24. No other cervical spinal cord lesion. No cervical spinal cord enhancement. No abnormal intradural enhancement in this segment of the spine. No superimposed cervical spine degeneration, spinal stenosis, or foraminal stenosis. Negative visualized neck soft tissues. MR THORACIC SPINE FINDINGS Normal thoracic vertebral height and alignment. No marrow edema or evidence of acute osseous abnormality. Evidence of small ventral spinal cord lesion at T1-T2 again noted. No associated cord expansion or enhancement. No other thoracic spinal cord signal abnormality. No abnormal thoracic spinal cord enhancement or intradural enhancement. No thoracic spine degeneration. Capacious spinal canal. No spinal stenosis. The conus medullaris appears normal at T12-L1. Negative visualized posterior paraspinal soft tissues. Negative visualized thoracic and upper abdominal viscera. IMPRESSION: 1. Cervical spinal cord demyelinating lesion at C2-C3 with mild cord expansion, but no associated enhancement suggesting this is subacute. 2. Questionable small nonenhancing central  cord lesion at C5, and small nonenhancing ventral cord lesion at T1-T2. Elsewhere the spinal cord is normal. 3. No spinal stenosis. 4. Congenital incomplete segmentation of C2-C3. Otherwise no significant osseous abnormality in the cervical or thoracic spine; there is a tiny focus of nonspecific marrow signal change in the right C6 facet which appears to be benign and inconsequential. Electronically Signed   By: Odessa Fleming M.D.   On: 09/03/2015 14:16    Assessment and plan:   GABY HARNEY is an 20 y.o. female patient with newly diagnosed MS, based on abnormal brain MRI showing extensive demyelinating lesions, two of which enhance in left frontal and parietal white matter. She had C and T spine MRI's done, which showed chronic demyelinating lesions in cervical cord.   On iv solumedrol, doing well. Reports improved neuro symptoms.   Continue iv solumedrol, total of 5 days of Rx.   PT/OT/ST.   Will f/u.

## 2015-09-03 NOTE — Progress Notes (Signed)
[facility] PROGRESS NOTE  SEAIRA BYUS Benson:[id number] DOB: 02-18-1996 DOA: 09/02/2015   PCP: No PCP Per Patient     Assessment/Plan: Active Problems:   MS (multiple sclerosis) (HCC)   Demyelination, corpus callosum central (HCC)    Kelly Benson is a healthy 20 y.o. female, with no past medical history, who presented to the emergency department with complaints of dizziness, blurry vision, and weakness. Symptoms started approximately 1-1/2 months ago. Patient noted that she had more weakness in her right upper extremity and was having more dizziness. Her vision has been blurry. She has not seen an ophthalmologist or at night exam in many years. Over the last couple of days, she has been more unsteady on her feet and has noted that her dizziness has become more of a vertigo type feeling. Chest reveals that she's been speaking slowly. Patient works at [employer] and is on her feet constantly, feels that this has been affecting her job. Patient admitted for treatment of multiple sclerosis, evaluated by neurology  Assessment and plan MS flare-started on 1 g daily Solu-Medrol 5 days, 250 mg every 6 hours, PT/OT/speech consultation  Hypokalemia-replete  Leukocytosis-likely secondary to steroids    DVT prophylaxsis Lovenox  Code Status:  Full code   Family Communication: Discussed in detail with the patient, all imaging results, lab results explained to the patient   Disposition Plan:  Anticipate discharge after completion of treatment    Consultants:  *Neurology    Procedures:  None  Antibiotics: Anti-infectives    None         HPI/Subjective: Some improvement in the patient's speech and right-sided weakness  Objective: Filed Vitals:   09/02/15 2100 09/03/15 0100 09/03/15 0538 09/03/15 0958  BP: 124/63 152/77 118/63 122/58  Pulse: 104 71 69 88  Temp: 99 F (37.2 C) 97.9 F (36.6 C) 98 F (36.7 C) 97.9 F (36.6 C)  TempSrc: Oral  Oral Oral Oral  Resp: 20 18 20 20   Height:      Weight:      SpO2: 99% 100% 99% 99%   No intake or output data in the 24 hours ending 09/03/15 1153  Exam:  Examination:  General exam: Appears calm and comfortable  Respiratory system: Clear to auscultation. Respiratory effort normal. Cardiovascular system: S1 & S2 heard, RRR. No JVD, murmurs, rubs, gallops or clicks. No pedal edema. Gastrointestinal system: Abdomen is nondistended, soft and nontender. No organomegaly or masses felt. Normal bowel sounds heard. Central nervous system: Alert and oriented. No focal neurological deficits. Extremities: Symmetric 5 x 5 power. Skin: No rashes, lesions or ulcers Psychiatry: Judgement and insight appear normal. Mood & affect appropriate.     Data Reviewed: I have personally reviewed following labs and imaging studies  Micro Results No results found for this or any previous visit (from the past 240 hour(s)).  Radiology Reports Mr Lodema Pilot Contrast  09/02/2015  CLINICAL DATA:  20 year old female with intermittent dizziness. Right eye blurred vision. Right upper extremity weakness. Speech difficulty. Initial encounter. EXAM: MRI HEAD WITHOUT AND WITH CONTRAST TECHNIQUE: Multiplanar, multiecho pulse sequences of the brain and surrounding structures were obtained without and with intravenous contrast. CONTRAST:  17mL MULTIHANCE GADOBENATE DIMEGLUMINE 529 MG/ML IV SOLN COMPARISON:  None. FINDINGS: Cerebral volume is within normal limits. No restricted diffusion to suggest acute infarction. No midline shift, mass effect, evidence of mass lesion, ventriculomegaly, extra-axial collection or acute intracranial hemorrhage. Cervicomedullary junction and pituitary are within normal  limits. Major intracranial vascular flow voids are within normal limits. Fairly widespread patchy and nodular bilateral cerebral white matter T2 and FLAIR hyperintensity. Bilateral temporal lobe involvement. Patchy corpus callosum  involvement, primarily in the body. Some of the largest periventricular lesions are oriented perpendicular to the ventricles. There is patchy involvement in the midbrain and periaqueductal gray matter. There is nodular involvement in the posterior medulla (series 8, image 6) and also in the right cerebellum. None of these lesions appear restricted. However, 2 posterior left hemisphere lesions are enhancing. The larger is rim enhancing (series 14 image 13). No associated mass effect or surrounding edema. Grossly negative visualized cervical spinal cord. 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 No definite abnormal signal in the optic nerves. Other orbits soft tissues appear normal. Negative scalp soft tissues. Visualized bone marrow signal is within normal limits. IMPRESSION: 1. Constellation of findings most compatible with demyelinating disease, favor Multiple Sclerosis. Two enhancing white matter lesions in the left hemisphere compatible with acute demyelination. 2. 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 Electronically Signed   By: Odessa Fleming M.D.   On: Sep 20, 2015 11:27     CBC  Recent Labs Lab 09-20-2015 0920 09/03/15 0419  WBC 7.8 13.9*  HGB 13.0 12.9  HCT 38.1 38.7  PLT 263 291  MCV 82.1 83.9  MCH 28.0 28.0  MCHC 34.1 33.3  RDW 13.0 13.0  LYMPHSABS 2.1  --   MONOABS 0.7  --   EOSABS 0.1  --   BASOSABS 0.0  --     Chemistries   Recent Labs Lab Sep 20, 2015 0920 09/03/15 0419  NA 138 140  K 3.4* 3.8  CL 107 108  CO2 22 20*  GLUCOSE 97 123*  BUN 14 10  CREATININE 0.59 0.58  CALCIUM 9.1 9.2  AST 14* 14*  ALT 11* 11*  ALKPHOS 47 47  BILITOT 0.6 0.5   ------------------------------------------------------------------------------------------------------------------ estimated creatinine clearance is 111.3 mL/min (by C-G formula based on Cr of  0.58). ------------------------------------------------------------------------------------------------------------------ No results for input(s): HGBA1C in the last 72 hours. ------------------------------------------------------------------------------------------------------------------ No results for input(s): CHOL, HDL, LDLCALC, TRIG, CHOLHDL, LDLDIRECT in the last 72 hours. ------------------------------------------------------------------------------------------------------------------  Recent Labs  Sep 20, 2015 0920  TSH 3.397   ------------------------------------------------------------------------------------------------------------------ No results for input(s): VITAMINB12, FOLATE, FERRITIN, TIBC, IRON, RETICCTPCT in the last 72 hours.  Coagulation profile No results for input(s): INR, PROTIME in the last 168 hours.  No results for input(s): DDIMER in the last 72 hours.  Cardiac Enzymes No results for input(s): CKMB, TROPONINI, MYOGLOBIN in the last 168 hours.  Invalid input(s): CK ------------------------------------------------------------------------------------------------------------------ Invalid input(s): POCBNP   CBG: No results for input(s): GLUCAP in the last 168 hours.     Studies: Mr Lodema Pilot Contrast  2015-09-20  CLINICAL DATA:  20 year old female with intermittent dizziness. Right eye blurred vision. Right upper extremity weakness. Speech difficulty. Initial encounter. EXAM: MRI HEAD WITHOUT AND WITH CONTRAST TECHNIQUE: Multiplanar, multiecho pulse sequences of the brain and surrounding structures were obtained without and with intravenous contrast. CONTRAST:  17mL MULTIHANCE GADOBENATE DIMEGLUMINE 529 MG/ML IV SOLN COMPARISON:  None. FINDINGS: Cerebral volume is within normal limits. No restricted diffusion to suggest acute infarction. No midline shift, mass effect, evidence of mass lesion, ventriculomegaly, extra-axial collection or acute intracranial  hemorrhage. Cervicomedullary junction and pituitary are within normal limits. Major intracranial vascular flow voids are within normal limits. Fairly widespread patchy and nodular bilateral cerebral white matter T2 and FLAIR hyperintensity. Bilateral temporal lobe involvement. Patchy  corpus callosum involvement, primarily in the body. Some of the largest periventricular lesions are oriented perpendicular to the ventricles. There is patchy involvement in the midbrain and periaqueductal gray matter. There is nodular involvement in the posterior medulla (series 8, image 6) and also in the right cerebellum. None of these lesions appear restricted. However, 2 posterior left hemisphere lesions are enhancing. The larger is rim enhancing (series 14 image 13). No associated mass effect or surrounding edema. Grossly negative visualized cervical spinal cord. 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 No definite abnormal signal in the optic nerves. Other orbits soft tissues appear normal. Negative scalp soft tissues. Visualized bone marrow signal is within normal limits. IMPRESSION: 1. Constellation of findings most compatible with demyelinating disease, favor Multiple Sclerosis. Two enhancing white matter lesions in the left hemisphere compatible with acute demyelination. 2. 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 Electronically Signed   By: Odessa Fleming M.D.   On: 09/02/2015 11:27      No results found for: HGBA1C Lab Results  Component Value Date   CREATININE 0.58 09/03/2015       Scheduled Meds: . methylPREDNISolone (SOLU-MEDROL) injection  250 mg Intravenous Q6H  . pantoprazole  40 mg Oral Daily  . sodium chloride flush  3 mL Intravenous Q12H   Continuous Infusions:    LOS: 1 day    Time spent: >30 MINS    [facility]  [facility] Pager [phone]. If 7PM-7AM, please contact night-coverage at [email or web address], password  [facility] 09/03/2015, 11:53 AM  LOS: 1 day

## 2015-09-04 ENCOUNTER — Inpatient Hospital Stay (HOSPITAL_COMMUNITY): Payer: Self-pay

## 2015-09-04 DIAGNOSIS — R42 Dizziness and giddiness: Secondary | ICD-10-CM

## 2015-09-04 NOTE — Care Management Note (Signed)
Case Management Note  Patient Details  Name: Kelly Benson MRN: [id number] Date of Birth: [birth year]  Subjective/Objective:                    Action/Plan: Patient was admitted with MS.  Lives at home with spouse. Will follow for discharge needs pending patient's progress and physician orders.  Expected Discharge Date:                  Expected Discharge Plan:     In-House Referral:     Discharge planning Services     Post Acute Care Choice:    Choice offered to:     DME Arranged:    DME Agency:     HH Arranged:    HH Agency:     Status of Service:  In process, will continue to follow  Medicare Important Message Given:    Date Medicare IM Given:    Medicare IM give by:    Date Additional Medicare IM Given:    Additional Medicare Important Message give by:     If discussed at Long Length of Stay Meetings, dates discussed:    Additional Comments:  Anda Kraft, RN 09/04/2015, 11:39 AM [phone]

## 2015-09-04 NOTE — Care Management Note (Signed)
Case Management Note  Patient Details  Name: Kelly Benson MRN: [id number] Date of Birth: [birth year]  Subjective/Objective:                    Action/Plan: Patient without insurance and no PCP. Address listed as [facility] but patient says they are living locally. CM asked the patient about the [facility]. Patient was interested. CM able to get patient an appointment [facility] ([facility] overflow [facility]) for May 4th. Appointment placed on the AVS. Pt informed about using the pharmacy at [facility] for her medications at discharge to assist with the cost. CM continuing to follow for discharge needs.   Expected Discharge Date:                  Expected Discharge Plan:     In-House Referral:     Discharge planning Services     Post Acute Care Choice:    Choice offered to:     DME Arranged:    DME Agency:     HH Arranged:    HH Agency:     Status of Service:  In process, will continue to follow  Medicare Important Message Given:    Date Medicare IM Given:    Medicare IM give by:    Date Additional Medicare IM Given:    Additional Medicare Important Message give by:     If discussed at Long Length of Stay Meetings, dates discussed:    Additional Comments:  Kermit Balo, RN 09/04/2015, 11:55 AM

## 2015-09-04 NOTE — Progress Notes (Signed)
Subjective: Feels her legs are weak but also states she is feeling a little stronger since being on Solumedrol.   Exam: Filed Vitals:   09/04/15 0611 09/04/15 0940  BP: 115/60 134/64  Pulse: 87 67  Temp: 97.9 F (36.6 C) 98.1 F (36.7 C)  Resp: 20 20    HEENT-  Normocephalic, no lesions, without obvious abnormality.  Normal external eye and conjunctiva.  Normal TM's bilaterally.  Normal auditory canals and external ears. Normal external nose, mucus membranes and septum.  Normal pharynx. Cardiovascular- S1, S2 normal, pulses palpable throughout   Lungs- chest clear, no wheezing, rales, normal symmetric air entry Abdomen- normal findings: bowel sounds normal Extremities- no edema Lymph-no adenopathy palpable Musculoskeletal-no joint tenderness, deformity or swelling Skin-warm and dry, no hyperpigmentation, vitiligo, or suspicious lesions    Gen: In bed, NAD MS: alert and oriented, speech clear. Able to follow commands.  CN: PERRLA, EOMI, TML, Face symmetrical. Sensation intact Motor: left UE 3+/5 in deltoid with 4/5 in distal strength. Right UE 5/5, bilateral LE 5/5 Sensory: intact throughout DTR: 2+ throughout  Pertinent Labs: None  Felicie Morn PA-C [facility] [phone]  Impression: 20 year old female with right-sided weakness, multiple lesions with a mixture of enhancing and nonenhancing lesions as well as at least one previous clinical episode. Given this picture, I think we can say that this represents multiple sclerosis at this time and she is being treated with IV Solu-Medrol.   Recommendations: 1) IV Solu-Medrol, complete total 5 day course 2) PT  Ritta Slot, MD [facility] [phone]  If 7pm- 7am, please page neurology on call as listed in AMION.  09/04/2015, 10:02 AM

## 2015-09-04 NOTE — Progress Notes (Signed)
[facility] PROGRESS NOTE  Kelly Benson ZOX:[id number] DOB: May 29, 1995 DOA: 09/02/2015   PCP: No PCP Per Patient     Assessment/Plan: Active Problems:   MS (multiple sclerosis) (HCC)   Demyelination, corpus callosum central (HCC)   Dizziness    Kelly Benson is a healthy 20 y.o. female, with no past medical history, who presented to the emergency department with complaints of dizziness, blurry vision, and weakness. Symptoms started approximately 1-1/2 months ago. Patient noted that she had more weakness in her right upper extremity and was having more dizziness. Her vision has been blurry. She has not seen an ophthalmologist or at night exam in many years. Over the last couple of days, she has been more unsteady on her feet and has noted that her dizziness has become more of a vertigo type feeling. Chest reveals that she's been speaking slowly. Patient works at [employer] and is on her feet constantly, feels that this has been affecting her job. Patient admitted for treatment of multiple sclerosis, evaluated by neurology.MRI showing extensive demyelinating lesions, two of which enhance in left frontal and parietal white matter. She had C and T spine MRI's done, which showed chronic demyelinating lesions in cervical cord.   Assessment and plan MS flare-started on 1 g daily Solu-Medrol 5 days, 250 mg every 6 hours, PT/OT/speech consultation Reports some improvement in her symptoms of dizziness and improvement in her right upper extremity strength  Hypokalemia-repleted  Leukocytosis-likely secondary to steroids    DVT prophylaxsis Lovenox  Code Status:  Full code   Family Communication: Discussed in detail with the patient, all imaging results, lab results explained to the patient   Disposition Plan:  Anticipate discharge after completion of treatment    Consultants:  *Neurology    Procedures:  None  Antibiotics: Anti-infectives    None          HPI/Subjective: Some improvement in the patient's speech and right-sided weakness  Objective: Filed Vitals:   09/03/15 2231 09/04/15 0157 09/04/15 0611 09/04/15 0940  BP: 116/74 112/54 115/60 134/64  Pulse: 74 80 87 67  Temp: 98.3 F (36.8 C) 98 F (36.7 C) 97.9 F (36.6 C) 98.1 F (36.7 C)  TempSrc: Oral Oral Oral Oral  Resp: 20 20 20 20   Height:      Weight:      SpO2: 100% 100% 99% 99%   No intake or output data in the 24 hours ending 09/04/15 1058  Exam:  Examination:  General exam: Appears calm and comfortable  Respiratory system: Clear to auscultation. Respiratory effort normal. Cardiovascular system: S1 & S2 heard, RRR. No JVD, murmurs, rubs, gallops or clicks. No pedal edema. Gastrointestinal system: Abdomen is nondistended, soft and nontender. No organomegaly or masses felt. Normal bowel sounds heard. Central nervous system: Alert and oriented. No focal neurological deficits. Extremities: Symmetric 5 x 5 power. Skin: No rashes, lesions or ulcers Psychiatry: Judgement and insight appear normal. Mood & affect appropriate.     Data Reviewed: I have personally reviewed following labs and imaging studies  Micro Results No results found for this or any previous visit (from the past 240 hour(s)).  Radiology Reports Mr Lodema Pilot Contrast  09/02/2015  CLINICAL DATA:  20 year old female with intermittent dizziness. Right eye blurred vision. Right upper extremity weakness. Speech difficulty. Initial encounter. EXAM: MRI HEAD WITHOUT AND WITH CONTRAST TECHNIQUE: Multiplanar, multiecho pulse sequences of the brain and surrounding structures were obtained without and with intravenous  contrast. CONTRAST:  17mL MULTIHANCE GADOBENATE DIMEGLUMINE 529 MG/ML IV SOLN COMPARISON:  None. FINDINGS: Cerebral volume is within normal limits. No restricted diffusion to suggest acute infarction. No midline shift, mass effect, evidence of mass lesion, ventriculomegaly, extra-axial  collection or acute intracranial hemorrhage. Cervicomedullary junction and pituitary are within normal limits. Major intracranial vascular flow voids are within normal limits. Fairly widespread patchy and nodular bilateral cerebral white matter T2 and FLAIR hyperintensity. Bilateral temporal lobe involvement. Patchy corpus callosum involvement, primarily in the body. Some of the largest periventricular lesions are oriented perpendicular to the ventricles. There is patchy involvement in the midbrain and periaqueductal gray matter. There is nodular involvement in the posterior medulla (series 8, image 6) and also in the right cerebellum. None of these lesions appear restricted. However, 2 posterior left hemisphere lesions are enhancing. The larger is rim enhancing (series 14 image 13). No associated mass effect or surrounding edema. Grossly negative visualized cervical spinal cord. 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 No definite abnormal signal in the optic nerves. Other orbits soft tissues appear normal. Negative scalp soft tissues. Visualized bone marrow signal is within normal limits. IMPRESSION: 1. Constellation of findings most compatible with demyelinating disease, favor Multiple Sclerosis. Two enhancing white matter lesions in the left hemisphere compatible with acute demyelination. 2. 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 Electronically Signed   By: Odessa Fleming M.D.   On: 09/02/2015 11:27   Mr Cervical Spine W Wo Contrast  09/03/2015  CLINICAL DATA:  20 year old female with intermittent dizziness. Right eye blurred vision. Right upper extremity weakness. Speech difficulty. Evidence of multiple sclerosis with active demyelination on brain MRI yesterday. Initial encounter. EXAM: MRI THORACIC AND CERVICAL SPINE WITHOUT AND WITH CONTRAST TECHNIQUE: Multiplanar and multiecho pulse sequences of the thoracic and cervical spine were  obtained without and with intravenous contrast. CONTRAST:  15mL MULTIHANCE GADOBENATE DIMEGLUMINE 529 MG/ML IV SOLN COMPARISON:  Brain MRI 09/02/2015. 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 Mild reversal of cervical lordosis. No acute osseous abnormality identified. There is a small circumscribed and benign appearing T2 hyperintense and enhancing focus of marrow signal in the right C6 inferior articulating facet (series 9, image 16). This appears to be inconsequential. Abnormal increased STIR signal with mild expansion in the central and dorsal cervical spinal cord at C2-C3 (series 6, image 8 and series 8, image 36). No associated enhancement. The lesion appears more eccentric to the right. Questionable small central cord lesion at C5 seen only on series 8, image 24. No other cervical spinal cord lesion. No cervical spinal cord enhancement. No abnormal intradural enhancement in this segment of the spine. No superimposed cervical spine degeneration, spinal stenosis, or foraminal stenosis. Negative visualized neck soft tissues. MR THORACIC SPINE FINDINGS Normal thoracic vertebral height and alignment. No marrow edema or evidence of acute osseous abnormality. Evidence of small ventral spinal cord lesion at T1-T2 again noted. No associated cord expansion or enhancement. No other thoracic spinal cord signal abnormality. No abnormal thoracic spinal cord enhancement or intradural enhancement. No thoracic spine degeneration. Capacious spinal canal. No spinal stenosis. The conus medullaris appears normal at T12-L1. Negative visualized posterior paraspinal soft tissues. Negative visualized thoracic and upper abdominal viscera. IMPRESSION: 1. Cervical spinal cord demyelinating lesion at C2-C3 with mild cord expansion, but no associated enhancement suggesting this is subacute. 2. Questionable small nonenhancing central cord lesion at C5, and small nonenhancing ventral  cord lesion at T1-T2. Elsewhere  the spinal cord is normal. 3. No spinal stenosis. 4. Congenital incomplete segmentation of C2-C3. Otherwise no significant osseous abnormality in the cervical or thoracic spine; there is a tiny focus of nonspecific marrow signal change in the right C6 facet which appears to be benign and inconsequential. Electronically Signed   By: Odessa Fleming M.D.   On: 09/03/2015 14:16   Mr Thoracic Spine W Wo Contrast  09/03/2015  CLINICAL DATA:  20 year old female with intermittent dizziness. Right eye blurred vision. Right upper extremity weakness. Speech difficulty. Evidence of multiple sclerosis with active demyelination on brain MRI yesterday. Initial encounter. EXAM: MRI THORACIC AND CERVICAL SPINE WITHOUT AND WITH CONTRAST TECHNIQUE: Multiplanar and multiecho pulse sequences of the thoracic and cervical spine were obtained without and with intravenous contrast. CONTRAST:  15mL MULTIHANCE GADOBENATE DIMEGLUMINE 529 MG/ML IV SOLN COMPARISON:  Brain MRI 09/02/2015. 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 Mild reversal of cervical lordosis. No acute osseous abnormality identified. There is a small circumscribed and benign appearing T2 hyperintense and enhancing focus of marrow signal in the right C6 inferior articulating facet (series 9, image 16). This appears to be inconsequential. Abnormal increased STIR signal with mild expansion in the central and dorsal cervical spinal cord at C2-C3 (series 6, image 8 and series 8, image 36). No associated enhancement. The lesion appears more eccentric to the right. Questionable small central cord lesion at C5 seen only on series 8, image 24. No other cervical spinal cord lesion. No cervical spinal cord enhancement. No abnormal intradural enhancement in this segment of the spine. No superimposed cervical spine degeneration, spinal stenosis, or foraminal stenosis. Negative visualized neck soft  tissues. MR THORACIC SPINE FINDINGS Normal thoracic vertebral height and alignment. No marrow edema or evidence of acute osseous abnormality. Evidence of small ventral spinal cord lesion at T1-T2 again noted. No associated cord expansion or enhancement. No other thoracic spinal cord signal abnormality. No abnormal thoracic spinal cord enhancement or intradural enhancement. No thoracic spine degeneration. Capacious spinal canal. No spinal stenosis. The conus medullaris appears normal at T12-L1. Negative visualized posterior paraspinal soft tissues. Negative visualized thoracic and upper abdominal viscera. IMPRESSION: 1. Cervical spinal cord demyelinating lesion at C2-C3 with mild cord expansion, but no associated enhancement suggesting this is subacute. 2. Questionable small nonenhancing central cord lesion at C5, and small nonenhancing ventral cord lesion at T1-T2. Elsewhere the spinal cord is normal. 3. No spinal stenosis. 4. Congenital incomplete segmentation of C2-C3. Otherwise no significant osseous abnormality in the cervical or thoracic spine; there is a tiny focus of nonspecific marrow signal change in the right C6 facet which appears to be benign and inconsequential. Electronically Signed   By: Odessa Fleming M.D.   On: 09/03/2015 14:16     CBC  Recent Labs Lab 09/02/15 0920 09/03/15 0419  WBC 7.8 13.9*  HGB 13.0 12.9  HCT 38.1 38.7  PLT 263 291  MCV 82.1 83.9  MCH 28.0 28.0  MCHC 34.1 33.3  RDW 13.0 13.0  LYMPHSABS 2.1  --   MONOABS 0.7  --   EOSABS 0.1  --   BASOSABS 0.0  --     Chemistries   Recent Labs Lab 09/02/15 0920 09/03/15 0419  NA 138 140  K 3.4* 3.8  CL 107 108  CO2 22 20*  GLUCOSE 97 123*  BUN 14 10  CREATININE 0.59 0.58  CALCIUM 9.1 9.2  AST 14* 14*  ALT 11* 11*  ALKPHOS 47 47  BILITOT 0.6 0.5   ------------------------------------------------------------------------------------------------------------------ estimated creatinine clearance is 111.3 mL/min (by  C-G formula based on Cr of 0.58). ------------------------------------------------------------------------------------------------------------------ No results for input(s): HGBA1C in the last 72 hours. ------------------------------------------------------------------------------------------------------------------ No results for input(s): CHOL, HDL, LDLCALC, TRIG, CHOLHDL, LDLDIRECT in the last 72 hours. ------------------------------------------------------------------------------------------------------------------  Recent Labs  09/02/15 0920  TSH 3.397   ------------------------------------------------------------------------------------------------------------------ No results for input(s): VITAMINB12, FOLATE, FERRITIN, TIBC, IRON, RETICCTPCT in the last 72 hours.  Coagulation profile No results for input(s): INR, PROTIME in the last 168 hours.  No results for input(s): DDIMER in the last 72 hours.  Cardiac Enzymes No results for input(s): CKMB, TROPONINI, MYOGLOBIN in the last 168 hours.  Invalid input(s): CK ------------------------------------------------------------------------------------------------------------------ Invalid input(s): POCBNP   CBG: No results for input(s): GLUCAP in the last 168 hours.     Studies: Mr Cervical Spine W Wo Contrast  09/03/2015  CLINICAL DATA:  20 year old female with intermittent dizziness. Right eye blurred vision. Right upper extremity weakness. Speech difficulty. Evidence of multiple sclerosis with active demyelination on brain MRI yesterday. Initial encounter. EXAM: MRI THORACIC AND CERVICAL SPINE WITHOUT AND WITH CONTRAST TECHNIQUE: Multiplanar and multiecho pulse sequences of the thoracic and cervical spine were obtained without and with intravenous contrast. CONTRAST:  15mL MULTIHANCE GADOBENATE DIMEGLUMINE 529 MG/ML IV SOLN COMPARISON:  Brain MRI 09/02/2015. FINDINGS: MR CERVICAL SPINE FINDINGS Congenital incomplete segmentation  of C2-C3 re- demonstrated. Mild reversal of cervical lordosis. No acute osseous abnormality identified. There is a small circumscribed and benign appearing T2 hyperintense and enhancing focus of marrow signal in the right C6 inferior articulating facet (series 9, image 16). This appears to be inconsequential. Abnormal increased STIR signal with mild expansion in the central and dorsal cervical spinal cord at C2-C3 (series 6, image 8 and series 8, image 36). No associated enhancement. The lesion appears more eccentric to the right. Questionable small central cord lesion at C5 seen only on series 8, image 24. No other cervical spinal cord lesion. No cervical spinal cord enhancement. No abnormal intradural enhancement in this segment of the spine. No superimposed cervical spine degeneration, spinal stenosis, or foraminal stenosis. Negative visualized neck soft tissues. MR THORACIC SPINE FINDINGS Normal thoracic vertebral height and alignment. No marrow edema or evidence of acute osseous abnormality. Evidence of small ventral spinal cord lesion at T1-T2 again noted. No associated cord expansion or enhancement. No other thoracic spinal cord signal abnormality. No abnormal thoracic spinal cord enhancement or intradural enhancement. No thoracic spine degeneration. Capacious spinal canal. No spinal stenosis. The conus medullaris appears normal at T12-L1. Negative visualized posterior paraspinal soft tissues. Negative visualized thoracic and upper abdominal viscera. IMPRESSION: 1. Cervical spinal cord demyelinating lesion at C2-C3 with mild cord expansion, but no associated enhancement suggesting this is subacute. 2. Questionable small nonenhancing central cord lesion at C5, and small nonenhancing ventral cord lesion at T1-T2. Elsewhere the spinal cord is normal. 3. No spinal stenosis. 4. Congenital incomplete segmentation of C2-C3. Otherwise no significant osseous abnormality in the cervical or thoracic spine; there is a  tiny focus of nonspecific marrow signal change in the right C6 facet which appears to be benign and inconsequential. Electronically Signed   By: Odessa Fleming M.D.   On: 09/03/2015 14:16   Mr Thoracic Spine W Wo Contrast  09/03/2015  CLINICAL DATA:  20 year old female with intermittent dizziness. Right eye blurred vision. Right upper extremity weakness. Speech difficulty. Evidence of multiple sclerosis with active demyelination on brain MRI yesterday. Initial encounter. EXAM: MRI THORACIC AND CERVICAL SPINE  WITHOUT AND WITH CONTRAST TECHNIQUE: Multiplanar and multiecho pulse sequences of the thoracic and cervical spine were obtained without and with intravenous contrast. CONTRAST:  15mL MULTIHANCE GADOBENATE DIMEGLUMINE 529 MG/ML IV SOLN COMPARISON:  Brain MRI 09/02/2015. 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 Mild reversal of cervical lordosis. No acute osseous abnormality identified. There is a small circumscribed and benign appearing T2 hyperintense and enhancing focus of marrow signal in the right C6 inferior articulating facet (series 9, image 16). This appears to be inconsequential. Abnormal increased STIR signal with mild expansion in the central and dorsal cervical spinal cord at C2-C3 (series 6, image 8 and series 8, image 36). No associated enhancement. The lesion appears more eccentric to the right. Questionable small central cord lesion at C5 seen only on series 8, image 24. No other cervical spinal cord lesion. No cervical spinal cord enhancement. No abnormal intradural enhancement in this segment of the spine. No superimposed cervical spine degeneration, spinal stenosis, or foraminal stenosis. Negative visualized neck soft tissues. MR THORACIC SPINE FINDINGS Normal thoracic vertebral height and alignment. No marrow edema or evidence of acute osseous abnormality. Evidence of small ventral spinal cord lesion at T1-T2 again noted. No associated cord  expansion or enhancement. No other thoracic spinal cord signal abnormality. No abnormal thoracic spinal cord enhancement or intradural enhancement. No thoracic spine degeneration. Capacious spinal canal. No spinal stenosis. The conus medullaris appears normal at T12-L1. Negative visualized posterior paraspinal soft tissues. Negative visualized thoracic and upper abdominal viscera. IMPRESSION: 1. Cervical spinal cord demyelinating lesion at C2-C3 with mild cord expansion, but no associated enhancement suggesting this is subacute. 2. Questionable small nonenhancing central cord lesion at C5, and small nonenhancing ventral cord lesion at T1-T2. Elsewhere the spinal cord is normal. 3. No spinal stenosis. 4. Congenital incomplete segmentation of C2-C3. Otherwise no significant osseous abnormality in the cervical or thoracic spine; there is a tiny focus of nonspecific marrow signal change in the right C6 facet which appears to be benign and inconsequential. Electronically Signed   By: Odessa Fleming M.D.   On: 09/03/2015 14:16      No results found for: HGBA1C Lab Results  Component Value Date   CREATININE 0.58 09/03/2015       Scheduled Meds: . methylPREDNISolone (SOLU-MEDROL) injection  250 mg Intravenous Q6H  . pantoprazole  40 mg Oral Daily  . sodium chloride flush  3 mL Intravenous Q12H   Continuous Infusions:    LOS: 2 days    Time spent: >30 MINS    [facility]  [facility] Pager [phone]. If 7PM-7AM, please contact night-coverage at [email or web address], password [facility] 09/04/2015, 10:58 AM  LOS: 2 days

## 2015-09-05 LAB — BASIC METABOLIC PANEL
Anion gap: 10 (ref 5–15)
BUN: 15 mg/dL (ref 6–20)
CO2: 22 mmol/L (ref 22–32)
Calcium: 8.6 mg/dL — ABNORMAL LOW (ref 8.9–10.3)
Chloride: 105 mmol/L (ref 101–111)
Creatinine, Ser: 0.8 mg/dL (ref 0.44–1.00)
GFR calc Af Amer: >60 mL/min (ref >60)
GFR calc non Af Amer: >60 mL/min (ref >60)
Glucose, Bld: 207 mg/dL — ABNORMAL HIGH (ref 65–99)
Potassium: 3.6 mmol/L (ref 3.5–5.1)
Sodium: 137 mmol/L (ref 135–145)

## 2015-09-05 LAB — CBC
HCT: 38.2 % (ref 36.0–46.0)
Hemoglobin: 12.6 g/dL (ref 12.0–15.0)
MCH: 27.9 pg (ref 26.0–34.0)
MCHC: 33 g/dL (ref 30.0–36.0)
MCV: 84.5 fL (ref 78.0–100.0)
Platelets: 291 10*3/uL (ref 150–400)
RBC: 4.52 MIL/uL (ref 3.87–5.11)
RDW: 13.6 % (ref 11.5–15.5)
WBC: 23 10*3/uL — ABNORMAL HIGH (ref 4.0–10.5)

## 2015-09-05 NOTE — Progress Notes (Signed)
[facility] PROGRESS NOTE  RHYEN MAZARIEGO ZOX:[id number] DOB: March 04, 1996 DOA: 09/02/2015   PCP: No PCP Per Patient     Assessment/Plan: Active Problems:   MS (multiple sclerosis) (HCC)   Demyelination, corpus callosum central (HCC)   Dizziness    Kelly Benson is a healthy 20 y.o. female, with no past medical history, who presented to the emergency department with complaints of dizziness, blurry vision, and weakness. Symptoms started approximately 1-1/2 months ago. Patient noted that she had more weakness in her right upper extremity and was having more dizziness. Her vision has been blurry. She has not seen an ophthalmologist or at night exam in many years. Over the last couple of days, she has been more unsteady on her feet and has noted that her dizziness has become more of a vertigo type feeling. Chest reveals that she's been speaking slowly. Patient works at [employer] and is on her feet constantly, feels that this has been affecting her job. Patient admitted for treatment of multiple sclerosis, evaluated by neurology.MRI showing extensive demyelinating lesions, two of which enhance in left frontal and parietal white matter. She had C and T spine MRI's done, which showed chronic demyelinating lesions in cervical cord.   Assessment and plan MS flare-right-sided weakness, multiple lesions with a mixture of enhancing and nonenhancing lesions as well as at least one previous clinical episode. started on 1 g daily Solu-Medrol 5 days, 250 mg every 6 hours, PT/OT/speech consultation Reports some improvement in her symptoms of dizziness and improvement in her right upper extremity strength  Hypokalemia-repleted  Leukocytosis-likely secondary to steroids    DVT prophylaxsis Lovenox  Code Status:  Full code   Family Communication: Discussed in detail with the patient, all imaging results, lab results explained to the patient   Disposition Plan:  Anticipate  discharge after completion of treatment    Consultants:  *Neurology    Procedures:  None  Antibiotics: Anti-infectives    None         HPI/Subjective: Some improvement in the patient's speech and right-sided weakness  Objective: Filed Vitals:   09/04/15 2159 09/05/15 0145 09/05/15 0531 09/05/15 0932  BP: 124/66 119/59 121/65 120/55  Pulse: 60 71 54 69  Temp: 98.6 F (37 C) 98.1 F (36.7 C) 98.6 F (37 C) 98.3 F (36.8 C)  TempSrc: Oral Oral Oral Oral  Resp: 18 18 18 18   Height:      Weight:      SpO2: 100% 95% 99% 99%    Intake/Output Summary (Last 24 hours) at 09/05/15 1020 Last data filed at 09/05/15 0900  Gross per 24 hour  Intake    300 ml  Output      0 ml  Net    300 ml    Exam:  Examination:  General exam: Appears calm and comfortable  Respiratory system: Clear to auscultation. Respiratory effort normal. Cardiovascular system: S1 & S2 heard, RRR. No JVD, murmurs, rubs, gallops or clicks. No pedal edema. Gastrointestinal system: Abdomen is nondistended, soft and nontender. No organomegaly or masses felt. Normal bowel sounds heard. Central nervous system: Alert and oriented. No focal neurological deficits. Extremities: Symmetric 5 x 5 power. Skin: No rashes, lesions or ulcers Psychiatry: Judgement and insight appear normal. Mood & affect appropriate.     Data Reviewed: I have personally reviewed following labs and imaging studies  Micro Results No results found for this or any previous visit (from the past 240 hour(s)).  Radiology Reports Dg Chest 2 View  09/04/2015  CLINICAL DATA:  20 year old with shortness of breath today. Admitted with stroke symptoms. Possible multiple sclerosis. EXAM: CHEST  2 VIEW COMPARISON:  Thoracic spine radiographs 09/01/2008. FINDINGS: The heart size and mediastinal contours are normal. The lungs are clear. There is no pleural effusion or pneumothorax. No acute osseous findings are identified. Stable minimal  thoracic scoliosis. IMPRESSION: No active cardiopulmonary process. Electronically Signed   By: Carey Bullocks M.D.   On: 09/04/2015 12:24   Mr Laqueta Jean ZO Contrast  09/02/2015  CLINICAL DATA:  20 year old female with intermittent dizziness. Right eye blurred vision. Right upper extremity weakness. Speech difficulty. Initial encounter. EXAM: MRI HEAD WITHOUT AND WITH CONTRAST TECHNIQUE: Multiplanar, multiecho pulse sequences of the brain and surrounding structures were obtained without and with intravenous contrast. CONTRAST:  17mL MULTIHANCE GADOBENATE DIMEGLUMINE 529 MG/ML IV SOLN COMPARISON:  None. FINDINGS: Cerebral volume is within normal limits. No restricted diffusion to suggest acute infarction. No midline shift, mass effect, evidence of mass lesion, ventriculomegaly, extra-axial collection or acute intracranial hemorrhage. Cervicomedullary junction and pituitary are within normal limits. Major intracranial vascular flow voids are within normal limits. Fairly widespread patchy and nodular bilateral cerebral white matter T2 and FLAIR hyperintensity. Bilateral temporal lobe involvement. Patchy corpus callosum involvement, primarily in the body. Some of the largest periventricular lesions are oriented perpendicular to the ventricles. There is patchy involvement in the midbrain and periaqueductal gray matter. There is nodular involvement in the posterior medulla (series 8, image 6) and also in the right cerebellum. None of these lesions appear restricted. However, 2 posterior left hemisphere lesions are enhancing. The larger is rim enhancing (series 14 image 13). No associated mass effect or surrounding edema. Grossly negative visualized cervical spinal cord. 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 No definite abnormal signal in the optic nerves. Other orbits soft tissues appear normal. Negative scalp soft tissues. Visualized bone marrow signal is within normal limits. IMPRESSION: 1.  Constellation of findings most compatible with demyelinating disease, favor Multiple Sclerosis. Two enhancing white matter lesions in the left hemisphere compatible with acute demyelination. 2. 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 Electronically Signed   By: Odessa Fleming M.D.   On: 09/02/2015 11:27   Mr Cervical Spine W Wo Contrast  09/03/2015  CLINICAL DATA:  20 year old female with intermittent dizziness. Right eye blurred vision. Right upper extremity weakness. Speech difficulty. Evidence of multiple sclerosis with active demyelination on brain MRI yesterday. Initial encounter. EXAM: MRI THORACIC AND CERVICAL SPINE WITHOUT AND WITH CONTRAST TECHNIQUE: Multiplanar and multiecho pulse sequences of the thoracic and cervical spine were obtained without and with intravenous contrast. CONTRAST:  15mL MULTIHANCE GADOBENATE DIMEGLUMINE 529 MG/ML IV SOLN COMPARISON:  Brain MRI 09/02/2015. 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 Mild reversal of cervical lordosis. No acute osseous abnormality identified. There is a small circumscribed and benign appearing T2 hyperintense and enhancing focus of marrow signal in the right C6 inferior articulating facet (series 9, image 16). This appears to be inconsequential. Abnormal increased STIR signal with mild expansion in the central and dorsal cervical spinal cord at C2-C3 (series 6, image 8 and series 8, image 36). No associated enhancement. The lesion appears more eccentric to the right. Questionable small central cord lesion at C5 seen only on series 8, image 24. No other cervical spinal cord lesion. No cervical spinal cord enhancement. No abnormal intradural enhancement in this segment of  the spine. No superimposed cervical spine degeneration, spinal stenosis, or foraminal stenosis. Negative visualized neck soft tissues. MR THORACIC SPINE FINDINGS Normal  thoracic vertebral height and alignment. No marrow edema or evidence of acute osseous abnormality. Evidence of small ventral spinal cord lesion at T1-T2 again noted. No associated cord expansion or enhancement. No other thoracic spinal cord signal abnormality. No abnormal thoracic spinal cord enhancement or intradural enhancement. No thoracic spine degeneration. Capacious spinal canal. No spinal stenosis. The conus medullaris appears normal at T12-L1. Negative visualized posterior paraspinal soft tissues. Negative visualized thoracic and upper abdominal viscera. IMPRESSION: 1. Cervical spinal cord demyelinating lesion at C2-C3 with mild cord expansion, but no associated enhancement suggesting this is subacute. 2. Questionable small nonenhancing central cord lesion at C5, and small nonenhancing ventral cord lesion at T1-T2. Elsewhere the spinal cord is normal. 3. No spinal stenosis. 4. Congenital incomplete segmentation of C2-C3. Otherwise no significant osseous abnormality in the cervical or thoracic spine; there is a tiny focus of nonspecific marrow signal change in the right C6 facet which appears to be benign and inconsequential. Electronically Signed   By: Odessa Fleming M.D.   On: 09/03/2015 14:16   Mr Thoracic Spine W Wo Contrast  09/03/2015  CLINICAL DATA:  20 year old female with intermittent dizziness. Right eye blurred vision. Right upper extremity weakness. Speech difficulty. Evidence of multiple sclerosis with active demyelination on brain MRI yesterday. Initial encounter. EXAM: MRI THORACIC AND CERVICAL SPINE WITHOUT AND WITH CONTRAST TECHNIQUE: Multiplanar and multiecho pulse sequences of the thoracic and cervical spine were obtained without and with intravenous contrast. CONTRAST:  15mL MULTIHANCE GADOBENATE DIMEGLUMINE 529 MG/ML IV SOLN COMPARISON:  Brain MRI 09/02/2015. 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 Mild reversal of cervical lordosis.  No acute osseous abnormality identified. There is a small circumscribed and benign appearing T2 hyperintense and enhancing focus of marrow signal in the right C6 inferior articulating facet (series 9, image 16). This appears to be inconsequential. Abnormal increased STIR signal with mild expansion in the central and dorsal cervical spinal cord at C2-C3 (series 6, image 8 and series 8, image 36). No associated enhancement. The lesion appears more eccentric to the right. Questionable small central cord lesion at C5 seen only on series 8, image 24. No other cervical spinal cord lesion. No cervical spinal cord enhancement. No abnormal intradural enhancement in this segment of the spine. No superimposed cervical spine degeneration, spinal stenosis, or foraminal stenosis. Negative visualized neck soft tissues. MR THORACIC SPINE FINDINGS Normal thoracic vertebral height and alignment. No marrow edema or evidence of acute osseous abnormality. Evidence of small ventral spinal cord lesion at T1-T2 again noted. No associated cord expansion or enhancement. No other thoracic spinal cord signal abnormality. No abnormal thoracic spinal cord enhancement or intradural enhancement. No thoracic spine degeneration. Capacious spinal canal. No spinal stenosis. The conus medullaris appears normal at T12-L1. Negative visualized posterior paraspinal soft tissues. Negative visualized thoracic and upper abdominal viscera. IMPRESSION: 1. Cervical spinal cord demyelinating lesion at C2-C3 with mild cord expansion, but no associated enhancement suggesting this is subacute. 2. Questionable small nonenhancing central cord lesion at C5, and small nonenhancing ventral cord lesion at T1-T2. Elsewhere the spinal cord is normal. 3. No spinal stenosis. 4. Congenital incomplete segmentation of C2-C3. Otherwise no significant osseous abnormality in the cervical or thoracic spine; there is a tiny focus of nonspecific marrow signal change in the right C6  facet which appears to be benign and inconsequential. Electronically  Signed   By: Odessa Fleming M.D.   On: 09/03/2015 14:16     CBC  Recent Labs Lab 09/02/15 0920 09/03/15 0419 09/05/15 0227  WBC 7.8 13.9* 23.0*  HGB 13.0 12.9 12.6  HCT 38.1 38.7 38.2  PLT 263 291 291  MCV 82.1 83.9 84.5  MCH 28.0 28.0 27.9  MCHC 34.1 33.3 33.0  RDW 13.0 13.0 13.6  LYMPHSABS 2.1  --   --   MONOABS 0.7  --   --   EOSABS 0.1  --   --   BASOSABS 0.0  --   --     Chemistries   Recent Labs Lab 09/02/15 0920 09/03/15 0419 09/05/15 0227  NA 138 140 137  K 3.4* 3.8 3.6  CL 107 108 105  CO2 22 20* 22  GLUCOSE 97 123* 207*  BUN 14 10 15   CREATININE 0.59 0.58 0.80  CALCIUM 9.1 9.2 8.6*  AST 14* 14*  --   ALT 11* 11*  --   ALKPHOS 47 47  --   BILITOT 0.6 0.5  --    ------------------------------------------------------------------------------------------------------------------ estimated creatinine clearance is 111.3 mL/min (by C-G formula based on Cr of 0.8). ------------------------------------------------------------------------------------------------------------------ No results for input(s): HGBA1C in the last 72 hours. ------------------------------------------------------------------------------------------------------------------ No results for input(s): CHOL, HDL, LDLCALC, TRIG, CHOLHDL, LDLDIRECT in the last 72 hours. ------------------------------------------------------------------------------------------------------------------ No results for input(s): TSH, T4TOTAL, T3FREE, THYROIDAB in the last 72 hours.  Invalid input(s): FREET3 ------------------------------------------------------------------------------------------------------------------ No results for input(s): VITAMINB12, FOLATE, FERRITIN, TIBC, IRON, RETICCTPCT in the last 72 hours.  Coagulation profile No results for input(s): INR, PROTIME in the last 168 hours.  No results for input(s): DDIMER in the last 72  hours.  Cardiac Enzymes No results for input(s): CKMB, TROPONINI, MYOGLOBIN in the last 168 hours.  Invalid input(s): CK ------------------------------------------------------------------------------------------------------------------ Invalid input(s): POCBNP   CBG: No results for input(s): GLUCAP in the last 168 hours.     Studies: Dg Chest 2 View  09/04/2015  CLINICAL DATA:  20 year old with shortness of breath today. Admitted with stroke symptoms. Possible multiple sclerosis. EXAM: CHEST  2 VIEW COMPARISON:  Thoracic spine radiographs 09/01/2008. FINDINGS: The heart size and mediastinal contours are normal. The lungs are clear. There is no pleural effusion or pneumothorax. No acute osseous findings are identified. Stable minimal thoracic scoliosis. IMPRESSION: No active cardiopulmonary process. Electronically Signed   By: Carey Bullocks M.D.   On: 09/04/2015 12:24   Mr Cervical Spine W Wo Contrast  09/03/2015  CLINICAL DATA:  20 year old female with intermittent dizziness. Right eye blurred vision. Right upper extremity weakness. Speech difficulty. Evidence of multiple sclerosis with active demyelination on brain MRI yesterday. Initial encounter. EXAM: MRI THORACIC AND CERVICAL SPINE WITHOUT AND WITH CONTRAST TECHNIQUE: Multiplanar and multiecho pulse sequences of the thoracic and cervical spine were obtained without and with intravenous contrast. CONTRAST:  55mL MULTIHANCE GADOBENATE DIMEGLUMINE 529 MG/ML IV SOLN COMPARISON:  Brain MRI 09/02/2015. 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 Mild reversal of cervical lordosis. No acute osseous abnormality identified. There is a small circumscribed and benign appearing T2 hyperintense and enhancing focus of marrow signal in the right C6 inferior articulating facet (series 9, image 16). This appears to be inconsequential. Abnormal increased STIR signal with mild expansion in the central  and dorsal cervical spinal cord at C2-C3 (series 6, image 8 and series 8, image 36). No associated enhancement. The lesion appears more eccentric to the right. Questionable small central cord lesion at C5  seen only on series 8, image 24. No other cervical spinal cord lesion. No cervical spinal cord enhancement. No abnormal intradural enhancement in this segment of the spine. No superimposed cervical spine degeneration, spinal stenosis, or foraminal stenosis. Negative visualized neck soft tissues. MR THORACIC SPINE FINDINGS Normal thoracic vertebral height and alignment. No marrow edema or evidence of acute osseous abnormality. Evidence of small ventral spinal cord lesion at T1-T2 again noted. No associated cord expansion or enhancement. No other thoracic spinal cord signal abnormality. No abnormal thoracic spinal cord enhancement or intradural enhancement. No thoracic spine degeneration. Capacious spinal canal. No spinal stenosis. The conus medullaris appears normal at T12-L1. Negative visualized posterior paraspinal soft tissues. Negative visualized thoracic and upper abdominal viscera. IMPRESSION: 1. Cervical spinal cord demyelinating lesion at C2-C3 with mild cord expansion, but no associated enhancement suggesting this is subacute. 2. Questionable small nonenhancing central cord lesion at C5, and small nonenhancing ventral cord lesion at T1-T2. Elsewhere the spinal cord is normal. 3. No spinal stenosis. 4. Congenital incomplete segmentation of C2-C3. Otherwise no significant osseous abnormality in the cervical or thoracic spine; there is a tiny focus of nonspecific marrow signal change in the right C6 facet which appears to be benign and inconsequential. Electronically Signed   By: Odessa Fleming M.D.   On: 09/03/2015 14:16   Mr Thoracic Spine W Wo Contrast  09/03/2015  CLINICAL DATA:  20 year old female with intermittent dizziness. Right eye blurred vision. Right upper extremity weakness. Speech difficulty.  Evidence of multiple sclerosis with active demyelination on brain MRI yesterday. Initial encounter. EXAM: MRI THORACIC AND CERVICAL SPINE WITHOUT AND WITH CONTRAST TECHNIQUE: Multiplanar and multiecho pulse sequences of the thoracic and cervical spine were obtained without and with intravenous contrast. CONTRAST:  15mL MULTIHANCE GADOBENATE DIMEGLUMINE 529 MG/ML IV SOLN COMPARISON:  Brain MRI 09/02/2015. 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 Mild reversal of cervical lordosis. No acute osseous abnormality identified. There is a small circumscribed and benign appearing T2 hyperintense and enhancing focus of marrow signal in the right C6 inferior articulating facet (series 9, image 16). This appears to be inconsequential. Abnormal increased STIR signal with mild expansion in the central and dorsal cervical spinal cord at C2-C3 (series 6, image 8 and series 8, image 36). No associated enhancement. The lesion appears more eccentric to the right. Questionable small central cord lesion at C5 seen only on series 8, image 24. No other cervical spinal cord lesion. No cervical spinal cord enhancement. No abnormal intradural enhancement in this segment of the spine. No superimposed cervical spine degeneration, spinal stenosis, or foraminal stenosis. Negative visualized neck soft tissues. MR THORACIC SPINE FINDINGS Normal thoracic vertebral height and alignment. No marrow edema or evidence of acute osseous abnormality. Evidence of small ventral spinal cord lesion at T1-T2 again noted. No associated cord expansion or enhancement. No other thoracic spinal cord signal abnormality. No abnormal thoracic spinal cord enhancement or intradural enhancement. No thoracic spine degeneration. Capacious spinal canal. No spinal stenosis. The conus medullaris appears normal at T12-L1. Negative visualized posterior paraspinal soft tissues. Negative visualized thoracic and upper  abdominal viscera. IMPRESSION: 1. Cervical spinal cord demyelinating lesion at C2-C3 with mild cord expansion, but no associated enhancement suggesting this is subacute. 2. Questionable small nonenhancing central cord lesion at C5, and small nonenhancing ventral cord lesion at T1-T2. Elsewhere the spinal cord is normal. 3. No spinal stenosis. 4. Congenital incomplete segmentation of C2-C3. Otherwise no significant osseous abnormality in the cervical or  thoracic spine; there is a tiny focus of nonspecific marrow signal change in the right C6 facet which appears to be benign and inconsequential. Electronically Signed   By: Odessa Fleming M.D.   On: 09/03/2015 14:16      No results found for: HGBA1C Lab Results  Component Value Date   CREATININE 0.80 09/05/2015       Scheduled Meds: . methylPREDNISolone (SOLU-MEDROL) injection  250 mg Intravenous Q6H  . pantoprazole  40 mg Oral Daily  . sodium chloride flush  3 mL Intravenous Q12H   Continuous Infusions:    LOS: 3 days    Time spent: >30 MINS    [facility]  [facility] Pager [phone]. If 7PM-7AM, please contact night-coverage at [email or web address], password [facility] 09/05/2015, 10:20 AM  LOS: 3 days

## 2015-09-05 NOTE — Progress Notes (Signed)
Subjective: Continues to improve  Exam: Filed Vitals:   09/05/15 0531 09/05/15 0932  BP: 121/65 120/55  Pulse: 54 69  Temp: 98.6 F (37 C) 98.3 F (36.8 C)  Resp: 18 18      Gen: In bed, NAD MS: alert and oriented, speech clear. Able to follow commands.  CN: PERRLA, EOMI, TML, Face symmetrical. Sensation intact Motor: left UE 3+/5 in deltoid with 4/5 in distal strength. Right UE 5/5, bilateral LE 5/5 Sensory: intact throughout DTR: 2+ throughout  Pertinent Labs: None  Impression: 20 year old female with right-sided weakness, multiple lesions with a mixture of enhancing and nonenhancing lesions as well as at least one previous clinical episode. Given this picture, I think we can say that this represents multiple sclerosis at this time and she is being treated with IV Solu-Medrol.   Recommendations: 1) IV Solu-Medrol, complete total 5 day course 2) PT  Ritta Slot, MD [facility] [phone]  If 7pm- 7am, please page neurology on call as listed in AMION.  09/05/2015, 11:21 AM

## 2015-09-05 NOTE — Evaluation (Signed)
Physical Therapy Evaluation Patient Details Name: MEARL HAREWOOD MRN: [id number] DOB: 1996/04/12 Today's Date: 09/05/2015   History of Present Illness  Patient is a 20 y/o female with no PMH presents with 2 months of dizziness, blurry vision and weakness. MRI showing extensive demyelinating lesions, two of which enhance in left frontal and parietal white matter consistent with MS.  Clinical Impression  Patient presents with generalized weakness, incoordination BUEs, balance deficits and dizziness impacting mobility and putting pt at increased risk for falls. Would benefit from DME during gait training for support- will try RW vs [facility] tomorrow. Tolerated stair training and gait training with Min A-Min guard for safety. Discussed affects of fatigue and avoiding hot environments during flare ups. Will follow acutely to maximize independence and mobility prior to return home.    Follow Up Recommendations Outpatient PT    Equipment Recommendations  Other (comment) (TBD)    Recommendations for Other Services OT consult     Precautions / Restrictions Precautions Precautions: Fall Restrictions Weight Bearing Restrictions: No      Mobility  Bed Mobility Overal bed mobility: Needs Assistance Bed Mobility: Supine to Sit     Supine to sit: Modified independent (Device/Increase time);HOB elevated     General bed mobility comments: No assist needed.   Transfers Overall transfer level: Needs assistance Equipment used: None Transfers: Sit to/from Stand Sit to Stand: Min guard         General transfer comment: Stood from EOB - unsteady upon standing reaching for tray for support.   Ambulation/Gait Ambulation/Gait assistance: Min assist Ambulation Distance (Feet): 150 Feet Assistive device: None Gait Pattern/deviations: Step-through pattern;Decreased stride length;Scissoring;Drifts right/left Gait velocity: decreased Gait velocity interpretation: Below normal speed for  age/gender General Gait Details: Slow, guarded and unsteady gait reaching for rail at times and hand held support as well. Downward gaze to prevent dizziness.   Stairs Stairs: Yes Stairs assistance: Min guard Stair Management: Two rails;One rail Right;Step to pattern Number of Stairs: 13 General stair comments: Cues for technique. 1 UE support to ascend steps and 2 UE support to descend steps.  Wheelchair Mobility    Modified Rankin (Stroke Patients Only)       Balance Overall balance assessment: Needs assistance Sitting-balance support: Feet supported;No upper extremity supported Sitting balance-Leahy Scale: Good     Standing balance support: During functional activity Standing balance-Leahy Scale: Fair Standing balance comment: More comfortable with 1 UE support due to imbalance/dizziness.                             Pertinent Vitals/Pain Pain Assessment: No/denies pain    Home Living Family/patient expects to be discharged to:: Private residence Living Arrangements: Spouse/significant other Available Help at Discharge: Family Type of Home: Apartment Home Access: Stairs to enter Entrance Stairs-Rails: Right Entrance Stairs-Number of Steps: 1 flight Home Layout: One level Home Equipment: None      Prior Function Level of Independence: Independent         Comments: Works as Production designer, theatre/television/film. Reports falls at home down the stairs.     Hand Dominance   Dominant Hand: Right    Extremity/Trunk Assessment   Upper Extremity Assessment:  (Incoordination present BUEs as noted during FTN test with overshooting target.)           Lower Extremity Assessment: Generalized weakness         Communication   Communication: No difficulties  Cognition Arousal/Alertness: Awake/alert Behavior During Therapy:  WFL for tasks assessed/performed Overall Cognitive Status: Within Functional Limits for tasks assessed                      General Comments       Exercises        Assessment/Plan    PT Assessment Patient needs continued PT services  PT Diagnosis Difficulty walking;Abnormality of gait   PT Problem List Decreased strength;Decreased coordination;Decreased activity tolerance;Decreased balance;Decreased mobility;Decreased safety awareness  PT Treatment Interventions Balance training;Neuromuscular re-education;Gait training;Stair training;Functional mobility training;Therapeutic activities;Therapeutic exercise;Patient/family education;DME instruction   PT Goals (Current goals can be found in the Care Plan section) Acute Rehab PT Goals Patient Stated Goal: to return to PLOF PT Goal Formulation: With patient Time For Goal Achievement: 09/19/15 Potential to Achieve Goals: Good    Frequency Min 3X/week   Barriers to discharge Decreased caregiver support;Inaccessible home environment 1 flight to get into home and spouse works    Co-evaluation               End of Session Equipment Utilized During Treatment: Gait belt Activity Tolerance: Patient tolerated treatment well Patient left: in chair;with call bell/phone within reach Nurse Communication: Mobility status         Time: 2197-5883 PT Time Calculation (min) (ACUTE ONLY): 23 min   Charges:   PT Evaluation $PT Eval Moderate Complexity: 1 Procedure PT Treatments $Gait Training: 8-22 mins   PT G Codes:        Kyon Bentler A Olney Monier 09/05/2015, 12:17 PM Mylo Red, PT, DPT [phone]

## 2015-09-06 DIAGNOSIS — G35 Multiple sclerosis: Secondary | ICD-10-CM | POA: Insufficient documentation

## 2015-09-06 NOTE — Progress Notes (Addendum)
[facility] PROGRESS NOTE  Kelly Benson ZOX:[id number] DOB: 1995-10-10 DOA: 09/02/2015   PCP: No PCP Per Patient     Assessment/Plan: Active Problems:   MS (multiple sclerosis) (HCC)   Demyelination, corpus callosum central (HCC)   Dizziness    Kelly Benson is a healthy 20 y.o. female, with no past medical history, who presented to the emergency department with complaints of dizziness, blurry vision, and weakness. Symptoms started approximately 1-1/2 months ago. Patient noted that she had more weakness in her right upper extremity and was having more dizziness. Her vision has been blurry. She has not seen an ophthalmologist or at night exam in many years. Over the last couple of days, she has been more unsteady on her feet and has noted that her dizziness has become more of a vertigo type feeling. Chest reveals that she's been speaking slowly. Patient works at [employer] and is on her feet constantly, feels that this has been affecting her job. Patient admitted for treatment of multiple sclerosis, evaluated by neurology.MRI showing extensive demyelinating lesions, two of which enhance in left frontal and parietal white matter. She had C and T spine MRI's done, which showed chronic demyelinating lesions in cervical cord.   Assessment and plan MS flare-right-sided weakness, multiple lesions with a mixture of enhancing and nonenhancing lesions as well as at least one previous clinical episode. started on 1 g daily Solu-Medrol 5 days, 250 mg every 6 hours on 4/22 evening , will complete treatment on 4/27 in the afternoon PT/OT/speech -recommend outpatient PT Reports some improvement in her symptoms of dizziness and improvement in her right upper extremity strength  Hypokalemia-repleted  Leukocytosis-likely secondary to steroids    DVT prophylaxsis Lovenox  Code Status:  Full code   Family Communication: Discussed in detail with the patient, all imaging results,  lab results explained to the patient   Disposition Plan:  Anticipate discharge after completion of treatment .4/ 27    Consultants:  *Neurology    Procedures:  None  Antibiotics: Anti-infectives    None         HPI/Subjective: Some improvement in the patient's speech and right-sided weakness,   Objective: Filed Vitals:   09/05/15 2100 09/06/15 0127 09/06/15 0500 09/06/15 0526  BP: 125/69 133/65  123/72  Pulse: 77 62  62  Temp: 98.2 F (36.8 C) 98 F (36.7 C) 98 F (36.7 C) 98 F (36.7 C)  TempSrc: Oral Oral  Oral  Resp: Height:      Weight:      SpO2: 98% 98%  96%    Intake/Output Summary (Last 24 hours) at 09/06/15 0851 Last data filed at 09/05/15 0900  Gross per 24 hour  Intake    300 ml  Output      0 ml  Net    300 ml    Exam:  Examination:  General exam: Appears calm and comfortable  Respiratory system: Clear to auscultation. Respiratory effort normal. Cardiovascular system: S1 & S2 heard, RRR. No JVD, murmurs, rubs, gallops or clicks. No pedal edema. Gastrointestinal system: Abdomen is nondistended, soft and nontender. No organomegaly or masses felt. Normal bowel sounds heard. Central nervous system: Alert and oriented. No focal neurological deficits. Motor: left UE 3+/5 in deltoid with 4/5 in distal strength. Right UE 5/5, bilateral LE 5/5 Sensory: intact throughout Skin: No rashes, lesions or ulcers Psychiatry: Judgement and insight appear normal. Mood & affect appropriate.  Data Reviewed: I have personally reviewed following labs and imaging studies  Micro Results No results found for this or any previous visit (from the past 240 hour(s)).  Radiology Reports Dg Chest 2 View  09/04/2015  CLINICAL DATA:  20 year old with shortness of breath today. Admitted with stroke symptoms. Possible multiple sclerosis. EXAM: CHEST  2 VIEW COMPARISON:  Thoracic spine radiographs 09/01/2008. FINDINGS: The heart size and mediastinal  contours are normal. The lungs are clear. There is no pleural effusion or pneumothorax. No acute osseous findings are identified. Stable minimal thoracic scoliosis. IMPRESSION: No active cardiopulmonary process. Electronically Signed   By: Carey Bullocks M.D.   On: 09/04/2015 12:24   Mr Laqueta Jean ZO Contrast  09/02/2015  CLINICAL DATA:  20 year old female with intermittent dizziness. Right eye blurred vision. Right upper extremity weakness. Speech difficulty. Initial encounter. EXAM: MRI HEAD WITHOUT AND WITH CONTRAST TECHNIQUE: Multiplanar, multiecho pulse sequences of the brain and surrounding structures were obtained without and with intravenous contrast. CONTRAST:  73mL MULTIHANCE GADOBENATE DIMEGLUMINE 529 MG/ML IV SOLN COMPARISON:  None. FINDINGS: Cerebral volume is within normal limits. No restricted diffusion to suggest acute infarction. No midline shift, mass effect, evidence of mass lesion, ventriculomegaly, extra-axial collection or acute intracranial hemorrhage. Cervicomedullary junction and pituitary are within normal limits. Major intracranial vascular flow voids are within normal limits. Fairly widespread patchy and nodular bilateral cerebral white matter T2 and FLAIR hyperintensity. Bilateral temporal lobe involvement. Patchy corpus callosum involvement, primarily in the body. Some of the largest periventricular lesions are oriented perpendicular to the ventricles. There is patchy involvement in the midbrain and periaqueductal gray matter. There is nodular involvement in the posterior medulla (series 8, image 6) and also in the right cerebellum. None of these lesions appear restricted. However, 2 posterior left hemisphere lesions are enhancing. The larger is rim enhancing (series 14 image 13). No associated mass effect or surrounding edema. Grossly negative visualized cervical spinal cord. 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 No definite abnormal signal in the optic nerves. Other  orbits soft tissues appear normal. Negative scalp soft tissues. Visualized bone marrow signal is within normal limits. IMPRESSION: 1. Constellation of findings most compatible with demyelinating disease, favor Multiple Sclerosis. Two enhancing white matter lesions in the left hemisphere compatible with acute demyelination. 2. 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 Electronically Signed   By: Odessa Fleming M.D.   On: 09/02/2015 11:27   Mr Cervical Spine W Wo Contrast  09/03/2015  CLINICAL DATA:  20 year old female with intermittent dizziness. Right eye blurred vision. Right upper extremity weakness. Speech difficulty. Evidence of multiple sclerosis with active demyelination on brain MRI yesterday. Initial encounter. EXAM: MRI THORACIC AND CERVICAL SPINE WITHOUT AND WITH CONTRAST TECHNIQUE: Multiplanar and multiecho pulse sequences of the thoracic and cervical spine were obtained without and with intravenous contrast. CONTRAST:  72mL MULTIHANCE GADOBENATE DIMEGLUMINE 529 MG/ML IV SOLN COMPARISON:  Brain MRI 09/02/2015. 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 Mild reversal of cervical lordosis. No acute osseous abnormality identified. There is a small circumscribed and benign appearing T2 hyperintense and enhancing focus of marrow signal in the right C6 inferior articulating facet (series 9, image 16). This appears to be inconsequential. Abnormal increased STIR signal with mild expansion in the central and dorsal cervical spinal cord at C2-C3 (series 6, image 8 and series 8, image 36). No associated enhancement. The lesion appears more eccentric to the right. Questionable small central cord  lesion at C5 seen only on series 8, image 24. No other cervical spinal cord lesion. No cervical spinal cord enhancement. No abnormal intradural enhancement in this segment of the spine. No superimposed cervical  spine degeneration, spinal stenosis, or foraminal stenosis. Negative visualized neck soft tissues. MR THORACIC SPINE FINDINGS Normal thoracic vertebral height and alignment. No marrow edema or evidence of acute osseous abnormality. Evidence of small ventral spinal cord lesion at T1-T2 again noted. No associated cord expansion or enhancement. No other thoracic spinal cord signal abnormality. No abnormal thoracic spinal cord enhancement or intradural enhancement. No thoracic spine degeneration. Capacious spinal canal. No spinal stenosis. The conus medullaris appears normal at T12-L1. Negative visualized posterior paraspinal soft tissues. Negative visualized thoracic and upper abdominal viscera. IMPRESSION: 1. Cervical spinal cord demyelinating lesion at C2-C3 with mild cord expansion, but no associated enhancement suggesting this is subacute. 2. Questionable small nonenhancing central cord lesion at C5, and small nonenhancing ventral cord lesion at T1-T2. Elsewhere the spinal cord is normal. 3. No spinal stenosis. 4. Congenital incomplete segmentation of C2-C3. Otherwise no significant osseous abnormality in the cervical or thoracic spine; there is a tiny focus of nonspecific marrow signal change in the right C6 facet which appears to be benign and inconsequential. Electronically Signed   By: Odessa Fleming M.D.   On: 09/03/2015 14:16   Mr Thoracic Spine W Wo Contrast  09/03/2015  CLINICAL DATA:  20 year old female with intermittent dizziness. Right eye blurred vision. Right upper extremity weakness. Speech difficulty. Evidence of multiple sclerosis with active demyelination on brain MRI yesterday. Initial encounter. EXAM: MRI THORACIC AND CERVICAL SPINE WITHOUT AND WITH CONTRAST TECHNIQUE: Multiplanar and multiecho pulse sequences of the thoracic and cervical spine were obtained without and with intravenous contrast. CONTRAST:  15mL MULTIHANCE GADOBENATE DIMEGLUMINE 529 MG/ML IV SOLN COMPARISON:  Brain MRI 09/02/2015.  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 Mild reversal of cervical lordosis. No acute osseous abnormality identified. There is a small circumscribed and benign appearing T2 hyperintense and enhancing focus of marrow signal in the right C6 inferior articulating facet (series 9, image 16). This appears to be inconsequential. Abnormal increased STIR signal with mild expansion in the central and dorsal cervical spinal cord at C2-C3 (series 6, image 8 and series 8, image 36). No associated enhancement. The lesion appears more eccentric to the right. Questionable small central cord lesion at C5 seen only on series 8, image 24. No other cervical spinal cord lesion. No cervical spinal cord enhancement. No abnormal intradural enhancement in this segment of the spine. No superimposed cervical spine degeneration, spinal stenosis, or foraminal stenosis. Negative visualized neck soft tissues. MR THORACIC SPINE FINDINGS Normal thoracic vertebral height and alignment. No marrow edema or evidence of acute osseous abnormality. Evidence of small ventral spinal cord lesion at T1-T2 again noted. No associated cord expansion or enhancement. No other thoracic spinal cord signal abnormality. No abnormal thoracic spinal cord enhancement or intradural enhancement. No thoracic spine degeneration. Capacious spinal canal. No spinal stenosis. The conus medullaris appears normal at T12-L1. Negative visualized posterior paraspinal soft tissues. Negative visualized thoracic and upper abdominal viscera. IMPRESSION: 1. Cervical spinal cord demyelinating lesion at C2-C3 with mild cord expansion, but no associated enhancement suggesting this is subacute. 2. Questionable small nonenhancing central cord lesion at C5, and small nonenhancing ventral cord lesion at T1-T2. Elsewhere the spinal cord is normal. 3. No spinal stenosis. 4. Congenital incomplete segmentation of C2-C3. Otherwise no  significant osseous abnormality  in the cervical or thoracic spine; there is a tiny focus of nonspecific marrow signal change in the right C6 facet which appears to be benign and inconsequential. Electronically Signed   By: Odessa Fleming M.D.   On: 09/03/2015 14:16     CBC  Recent Labs Lab 09/02/15 0920 09/03/15 0419 09/05/15 0227  WBC 7.8 13.9* 23.0*  HGB 13.0 12.9 12.6  HCT 38.1 38.7 38.2  PLT 263 291 291  MCV 82.1 83.9 84.5  MCH 28.0 28.0 27.9  MCHC 34.1 33.3 33.0  RDW 13.0 13.0 13.6  LYMPHSABS 2.1  --   --   MONOABS 0.7  --   --   EOSABS 0.1  --   --   BASOSABS 0.0  --   --     Chemistries   Recent Labs Lab 09/02/15 0920 09/03/15 0419 09/05/15 0227  NA 138 140 137  K 3.4* 3.8 3.6  CL 107 108 105  CO2 22 20* 22  GLUCOSE 97 123* 207*  BUN 14 10 15   CREATININE 0.59 0.58 0.80  CALCIUM 9.1 9.2 8.6*  AST 14* 14*  --   ALT 11* 11*  --   ALKPHOS 47 47  --   BILITOT 0.6 0.5  --    ------------------------------------------------------------------------------------------------------------------ estimated creatinine clearance is 111.3 mL/min (by C-G formula based on Cr of 0.8). ------------------------------------------------------------------------------------------------------------------ No results for input(s): HGBA1C in the last 72 hours. ------------------------------------------------------------------------------------------------------------------ No results for input(s): CHOL, HDL, LDLCALC, TRIG, CHOLHDL, LDLDIRECT in the last 72 hours. ------------------------------------------------------------------------------------------------------------------ No results for input(s): TSH, T4TOTAL, T3FREE, THYROIDAB in the last 72 hours.  Invalid input(s): FREET3 ------------------------------------------------------------------------------------------------------------------ No results for input(s): VITAMINB12, FOLATE, FERRITIN, TIBC, IRON, RETICCTPCT in the last 72  hours.  Coagulation profile No results for input(s): INR, PROTIME in the last 168 hours.  No results for input(s): DDIMER in the last 72 hours.  Cardiac Enzymes No results for input(s): CKMB, TROPONINI, MYOGLOBIN in the last 168 hours.  Invalid input(s): CK ------------------------------------------------------------------------------------------------------------------ Invalid input(s): POCBNP   CBG: No results for input(s): GLUCAP in the last 168 hours.     Studies: Dg Chest 2 View  09/04/2015  CLINICAL DATA:  20 year old with shortness of breath today. Admitted with stroke symptoms. Possible multiple sclerosis. EXAM: CHEST  2 VIEW COMPARISON:  Thoracic spine radiographs 09/01/2008. FINDINGS: The heart size and mediastinal contours are normal. The lungs are clear. There is no pleural effusion or pneumothorax. No acute osseous findings are identified. Stable minimal thoracic scoliosis. IMPRESSION: No active cardiopulmonary process. Electronically Signed   By: Carey Bullocks M.D.   On: 09/04/2015 12:24      No results found for: HGBA1C Lab Results  Component Value Date   CREATININE 0.80 09/05/2015       Scheduled Meds: . methylPREDNISolone (SOLU-MEDROL) injection  250 mg Intravenous Q6H  . pantoprazole  40 mg Oral Daily  . sodium chloride flush  3 mL Intravenous Q12H   Continuous Infusions:    LOS: 4 days    Time spent: >30 MINS    [facility]  [facility] Pager [phone]. If 7PM-7AM, please contact night-coverage at [email or web address], password [email or web address] 09/06/2015, 8:51 AM  LOS: 4 days

## 2015-09-06 NOTE — Progress Notes (Signed)
Subjective: Continues to improve  Exam: Filed Vitals:   09/06/15 0500 09/06/15 0526  BP:  123/72  Pulse:  62  Temp: 98 F (36.7 C) 98 F (36.7 C)  Resp:  18       Gen: In bed, NAD MS: alert and oriented, speech clear. Able to follow commands.  CN: PERRLA, EOMI, TML, Face symmetrical. Sensation intact Motor: left UE 3+/5 in deltoid with 4/5 in distal strength. Right UE 5/5, bilateral LE 5/5 Sensory: intact throughout DTR: 2+ throughout  Pertinent Labs: none  Felicie Morn PA-C [facility] [phone]  Impression: 20 year old female with right-sided weakness, multiple lesions with a mixture of enhancing and nonenhancing lesions as well as at least one previous clinical episode. Given this picture, I think we can say that this represents multiple sclerosis at this time and she is being treated with IV Solu-Medrol. Has 4 more doses left.    Recommendations: 1) IV Solu-Medrol, complete total 5 day course, will finish tomorrow morning.  2) PT 3) f/u with outpatient neurology.   Ritta Slot, MD [facility] [phone]  If 7pm- 7am, please page neurology on call as listed in AMION.  09/06/2015, 9:25 AM

## 2015-09-06 NOTE — Progress Notes (Signed)
Physical Therapy Treatment Patient Details Name: Kelly Benson MRN: [id number] DOB: 1995-09-29 Today's Date: 09/06/2015    History of Present Illness Patient is a 20 y/o female with no PMH presents with 2 months of dizziness, blurry vision and weakness. MRI showing extensive demyelinating lesions, two of which enhance in left frontal and parietal white matter consistent with MS.    PT Comments    Balance improved today from yesterday. Performed gait training with SPC for more support, especially on days when pt feeling weaker/unsteady. Tolerated stair training with SPC with supervision for safety. Continues to have balance deficits with distractions in environment or with head turns. Discussed modification duties with work and importance of rest breaks during activity to prevent fatigue. Would benefit from [facility] for community ambulation. Will follow acutely.   Follow Up Recommendations  Outpatient PT     Equipment Recommendations  Cane    Recommendations for Other Services       Precautions / Restrictions Precautions Precautions: Fall Restrictions Weight Bearing Restrictions: No    Mobility  Bed Mobility               General bed mobility comments: Sitting in chair upon PT arrival.   Transfers Overall transfer level: Needs assistance Equipment used: None Transfers: Sit to/from Stand Sit to Stand: Supervision         General transfer comment: Supervision for safety. Stood from couch x1. No imbalance noted.   Ambulation/Gait Ambulation/Gait assistance: Supervision;Min guard Ambulation Distance (Feet): 250 Feet Assistive device: Straight cane;Rolling walker (2 wheeled);None Gait Pattern/deviations: Step-through pattern;Decreased stride length;Drifts right/left Gait velocity: decreased Gait velocity interpretation: Below normal speed for age/gender General Gait Details: Gait training with SPC; ambulated with RW- no balance deficits noted, ambulated without  DME- mildly unsteady with distractions in environment and head movements but no overt LOB.   Stairs Stairs: Yes Stairs assistance: Supervision Stair Management: One rail Left;With cane Number of Stairs: 13 General stair comments: Cues for technique using SPC for support during stair negotiation.  Wheelchair Mobility    Modified Rankin (Stroke Patients Only)       Balance Overall balance assessment: Needs assistance Sitting-balance support: Feet supported;No upper extremity supported Sitting balance-Leahy Scale: Good     Standing balance support: During functional activity Standing balance-Leahy Scale: Fair                      Cognition Arousal/Alertness: Awake/alert Behavior During Therapy: WFL for tasks assessed/performed Overall Cognitive Status: Within Functional Limits for tasks assessed                      Exercises      General Comments        Pertinent Vitals/Pain Pain Assessment: No/denies pain    Home Living                      Prior Function            PT Goals (current goals can now be found in the care plan section) Progress towards PT goals: Progressing toward goals    Frequency  Min 3X/week    PT Plan Current plan remains appropriate    Co-evaluation             End of Session Equipment Utilized During Treatment: Gait belt Activity Tolerance: Patient tolerated treatment well Patient left: in chair;with call bell/phone within reach     Time: 1031-1056 PT Time Calculation (min) (  ACUTE ONLY): 25 min  Charges:  $Gait Training: 23-37 mins                    G Codes:      Margaree Sandhu A Ta Fair 09/06/2015, 11:55 AM Mylo Red, PT, DPT [phone]

## 2015-09-06 NOTE — Care Management Note (Signed)
Case Management Note  Patient Details  Name: Kelly Benson MRN: [id number] Date of Birth: 07-26-1995  Subjective/Objective:                    Action/Plan: CM consulted for outpatient therapy for the patient. CM met with Ms Kelly Benson and she is interested in [facility]. CM informed her that since she has no insurance that the payments would be out of pocket. Pt stated she would like time to think about it after discharge. CM placed orders in EPIC and placed on the AVS.   Expected Discharge Date:                  Expected Discharge Plan:     In-House Referral:     Discharge planning Services     Post Acute Care Choice:    Choice offered to:     DME Arranged:    DME Agency:     HH Arranged:    [place] Agency:     Status of Service:  In process, will continue to follow  Medicare Important Message Given:    Date Medicare IM Given:    Medicare IM give by:    Date Additional Medicare IM Given:    Additional Medicare Important Message give by:     If discussed at [place] of Stay Meetings, dates discussed:    Additional Comments:  Pollie Friar, RN 09/06/2015, 1:03 PM

## 2015-09-07 MED ORDER — PANTOPRAZOLE SODIUM 40 MG PO TBEC: 40.0000 mg | DELAYED_RELEASE_TABLET | Freq: Every day | ORAL | Status: DC

## 2015-09-07 NOTE — Discharge Summary (Signed)
Physician Discharge Summary  Kelly Benson MRN: [id number] DOB/AGE: 1995/11/03 20 y.o.  PCP: No PCP Per Patient   Admit date: 09/02/2015 Discharge date: 09/07/2015  Discharge Diagnoses:     Active Problems:   MS (multiple sclerosis) (HCC)   Demyelination, corpus callosum central (HCC)   Dizziness   Multiple sclerosis (HCC)    Follow-up recommendations Follow-up with PCP in 3-5 days , including all  additional recommended appointments as below Follow-up CBC, CMP in 3-5 days Patient needs to follow-up in [facility] for  a new diagnosis of MS      Current Discharge Medication List    START taking these medications   Details  pantoprazole (PROTONIX) 40 MG tablet Take 1 tablet (40 mg total) by mouth daily. Qty: 30 tablet, Refills: 1         Discharge Condition:Stable     Discharge Instructions Get Medicines reviewed and adjusted: Please take all your medications with you for your next visit with your Primary MD  Please request your Primary MD to go over all hospital tests and procedure/radiological results at the follow up, please ask your Primary MD to get all Hospital records sent to his/her office.  If you experience worsening of your admission symptoms, develop shortness of breath, life threatening emergency, suicidal or homicidal thoughts you must seek medical attention immediately by calling 911 or calling your MD immediately if symptoms less severe.  You must read complete instructions/literature along with all the possible adverse reactions/side effects for all the Medicines you take and that have been prescribed to you. Take any new Medicines after you have completely understood and accpet all the possible adverse reactions/side effects.   Do not drive when taking Pain medications.   Do not take more than prescribed Pain, Sleep and Anxiety Medications  Special Instructions: If you have smoked or chewed Tobacco in the last 2 yrs  please stop smoking, stop any regular Alcohol and or any Recreational drug use.  Wear Seat belts while driving.  Please note  You were cared for by a hospitalist during your hospital stay. Once you are discharged, your primary care physician will handle any further medical issues. Please note that NO REFILLS for any discharge medications will be authorized once you are discharged, as it is imperative that you return to your primary care physician (or establish a relationship with a primary care physician if you do not have one) for your aftercare needs so that they can reassess your need for medications and monitor your lab values.  Discharge Instructions    Ambulatory referral to Physical Therapy    Complete by:  As directed      Diet - low sodium heart healthy    Complete by:  As directed      Increase activity slowly    Complete by:  As directed             No Known Allergies    Disposition: 01-Home or Self Care   Consults:  Neurology *    Significant Diagnostic Studies:  Dg Chest 2 View  09/04/2015  CLINICAL DATA:  20 year old with shortness of breath today. Admitted with stroke symptoms. Possible multiple sclerosis. EXAM: CHEST  2 VIEW COMPARISON:  Thoracic spine radiographs 09/01/2008. FINDINGS: The heart size and mediastinal contours are normal. The lungs are clear. There is no pleural effusion or pneumothorax. No acute osseous findings are identified. Stable minimal thoracic scoliosis. IMPRESSION: No active cardiopulmonary process. Electronically Signed   By: Chrissie Noa  Purcell Mouton M.D.   On: 09/04/2015 12:24   Mr Laqueta Jean ZO Contrast  09/02/2015  CLINICAL DATA:  20 year old female with intermittent dizziness. Right eye blurred vision. Right upper extremity weakness. Speech difficulty. Initial encounter. EXAM: MRI HEAD WITHOUT AND WITH CONTRAST TECHNIQUE: Multiplanar, multiecho pulse sequences of the brain and surrounding structures were obtained without and with intravenous  contrast. CONTRAST:  17mL MULTIHANCE GADOBENATE DIMEGLUMINE 529 MG/ML IV SOLN COMPARISON:  None. FINDINGS: Cerebral volume is within normal limits. No restricted diffusion to suggest acute infarction. No midline shift, mass effect, evidence of mass lesion, ventriculomegaly, extra-axial collection or acute intracranial hemorrhage. Cervicomedullary junction and pituitary are within normal limits. Major intracranial vascular flow voids are within normal limits. Fairly widespread patchy and nodular bilateral cerebral white matter T2 and FLAIR hyperintensity. Bilateral temporal lobe involvement. Patchy corpus callosum involvement, primarily in the body. Some of the largest periventricular lesions are oriented perpendicular to the ventricles. There is patchy involvement in the midbrain and periaqueductal gray matter. There is nodular involvement in the posterior medulla (series 8, image 6) and also in the right cerebellum. None of these lesions appear restricted. However, 2 posterior left hemisphere lesions are enhancing. The larger is rim enhancing (series 14 image 13). No associated mass effect or surrounding edema. Grossly negative visualized cervical spinal cord. 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 No definite abnormal signal in the optic nerves. Other orbits soft tissues appear normal. Negative scalp soft tissues. Visualized bone marrow signal is within normal limits. IMPRESSION: 1. Constellation of findings most compatible with demyelinating disease, favor Multiple Sclerosis. Two enhancing white matter lesions in the left hemisphere compatible with acute demyelination. 2. 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 Electronically Signed   By: Odessa Fleming M.D.   On: 09/02/2015 11:27   Mr Cervical Spine W Wo Contrast  09/03/2015  CLINICAL DATA:  20 year old female with intermittent dizziness. Right eye blurred vision. Right upper extremity  weakness. Speech difficulty. Evidence of multiple sclerosis with active demyelination on brain MRI yesterday. Initial encounter. EXAM: MRI THORACIC AND CERVICAL SPINE WITHOUT AND WITH CONTRAST TECHNIQUE: Multiplanar and multiecho pulse sequences of the thoracic and cervical spine were obtained without and with intravenous contrast. CONTRAST:  15mL MULTIHANCE GADOBENATE DIMEGLUMINE 529 MG/ML IV SOLN COMPARISON:  Brain MRI 09/02/2015. 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 Mild reversal of cervical lordosis. No acute osseous abnormality identified. There is a small circumscribed and benign appearing T2 hyperintense and enhancing focus of marrow signal in the right C6 inferior articulating facet (series 9, image 16). This appears to be inconsequential. Abnormal increased STIR signal with mild expansion in the central and dorsal cervical spinal cord at C2-C3 (series 6, image 8 and series 8, image 36). No associated enhancement. The lesion appears more eccentric to the right. Questionable small central cord lesion at C5 seen only on series 8, image 24. No other cervical spinal cord lesion. No cervical spinal cord enhancement. No abnormal intradural enhancement in this segment of the spine. No superimposed cervical spine degeneration, spinal stenosis, or foraminal stenosis. Negative visualized neck soft tissues. MR THORACIC SPINE FINDINGS Normal thoracic vertebral height and alignment. No marrow edema or evidence of acute osseous abnormality. Evidence of small ventral spinal cord lesion at T1-T2 again noted. No associated cord expansion or enhancement. No other thoracic spinal cord signal abnormality. No abnormal thoracic spinal cord enhancement or intradural enhancement. No thoracic spine degeneration. Capacious spinal canal. No spinal  stenosis. The conus medullaris appears normal at T12-L1. Negative visualized posterior paraspinal soft tissues. Negative  visualized thoracic and upper abdominal viscera. IMPRESSION: 1. Cervical spinal cord demyelinating lesion at C2-C3 with mild cord expansion, but no associated enhancement suggesting this is subacute. 2. Questionable small nonenhancing central cord lesion at C5, and small nonenhancing ventral cord lesion at T1-T2. Elsewhere the spinal cord is normal. 3. No spinal stenosis. 4. Congenital incomplete segmentation of C2-C3. Otherwise no significant osseous abnormality in the cervical or thoracic spine; there is a tiny focus of nonspecific marrow signal change in the right C6 facet which appears to be benign and inconsequential. Electronically Signed   By: Odessa Fleming M.D.   On: 09/03/2015 14:16   Mr Thoracic Spine W Wo Contrast  09/03/2015  CLINICAL DATA:  20 year old female with intermittent dizziness. Right eye blurred vision. Right upper extremity weakness. Speech difficulty. Evidence of multiple sclerosis with active demyelination on brain MRI yesterday. Initial encounter. EXAM: MRI THORACIC AND CERVICAL SPINE WITHOUT AND WITH CONTRAST TECHNIQUE: Multiplanar and multiecho pulse sequences of the thoracic and cervical spine were obtained without and with intravenous contrast. CONTRAST:  83mL MULTIHANCE GADOBENATE DIMEGLUMINE 529 MG/ML IV SOLN COMPARISON:  Brain MRI 09/02/2015. 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 Mild reversal of cervical lordosis. No acute osseous abnormality identified. There is a small circumscribed and benign appearing T2 hyperintense and enhancing focus of marrow signal in the right C6 inferior articulating facet (series 9, image 16). This appears to be inconsequential. Abnormal increased STIR signal with mild expansion in the central and dorsal cervical spinal cord at C2-C3 (series 6, image 8 and series 8, image 36). No associated enhancement. The lesion appears more eccentric to the right. Questionable small central cord lesion at C5  seen only on series 8, image 24. No other cervical spinal cord lesion. No cervical spinal cord enhancement. No abnormal intradural enhancement in this segment of the spine. No superimposed cervical spine degeneration, spinal stenosis, or foraminal stenosis. Negative visualized neck soft tissues. MR THORACIC SPINE FINDINGS Normal thoracic vertebral height and alignment. No marrow edema or evidence of acute osseous abnormality. Evidence of small ventral spinal cord lesion at T1-T2 again noted. No associated cord expansion or enhancement. No other thoracic spinal cord signal abnormality. No abnormal thoracic spinal cord enhancement or intradural enhancement. No thoracic spine degeneration. Capacious spinal canal. No spinal stenosis. The conus medullaris appears normal at T12-L1. Negative visualized posterior paraspinal soft tissues. Negative visualized thoracic and upper abdominal viscera. IMPRESSION: 1. Cervical spinal cord demyelinating lesion at C2-C3 with mild cord expansion, but no associated enhancement suggesting this is subacute. 2. Questionable small nonenhancing central cord lesion at C5, and small nonenhancing ventral cord lesion at T1-T2. Elsewhere the spinal cord is normal. 3. No spinal stenosis. 4. Congenital incomplete segmentation of C2-C3. Otherwise no significant osseous abnormality in the cervical or thoracic spine; there is a tiny focus of nonspecific marrow signal change in the right C6 facet which appears to be benign and inconsequential. Electronically Signed   By: Odessa Fleming M.D.   On: 09/03/2015 14:16        Filed Weights   09/02/15 1343  Weight: 76.4 kg (168 lb 6.9 oz)     Microbiology: No results found for this or any previous visit (from the past 240 hour(s)).     Blood Culture    Component Value Date/Time   SDES URINE, CLEAN CATCH 01/15/2015 1804   SPECREQUEST Normal 01/15/2015 1804  CULT MULTIPLE SPECIES PRESENT, SUGGEST RECOLLECTION 01/15/2015 1804   REPTSTATUS  01/17/2015 FINAL 01/15/2015 1804      Labs: No results found for this or any previous visit (from the past 48 hour(s)).   Lipid Panel  No results found for: CHOL, TRIG, HDL, CHOLHDL, VLDL, LDLCALC, LDLDIRECT   No results found for: HGBA1C   Lab Results  Component Value Date   CREATININE 0.80 09/05/2015     Kelly Benson is a healthy 20 y.o. female, with no past medical history, who presented to the emergency department with complaints of dizziness, blurry vision, and weakness. Symptoms started approximately 1-1/2 months ago. Patient noted that she had more weakness in her right upper extremity and was having more dizziness. Her vision has been blurry. She has not seen an ophthalmologist or at night exam in many years. Over the last couple of days, she has been more unsteady on her feet and has noted that her dizziness has become more of a vertigo type feeling. Chest reveals that she's been speaking slowly. Patient works at [employer] and is on her feet constantly, feels that this has been affecting her job. Patient admitted for treatment of multiple sclerosis, evaluated by neurology.MRI showing extensive demyelinating lesions, two of which enhance in left frontal and parietal white matter. She had C and T spine MRI's done, which showed chronic demyelinating lesions in cervical cord.   Assessment and plan MS flare-right-sided weakness, multiple lesions with a mixture of enhancing and nonenhancing lesions as well as at least one previous clinical episode. started on 1 g daily Solu-Medrol 5 days, completed treatment on 4/27 in the afternoon PT/OT/speech -recommend outpatient PT Reports some improvement in her symptoms of dizziness and improvement in her right upper extremity strength  Hypokalemia-repleted  Leukocytosis-likely secondary to steroids     Discharge Exam:   Blood pressure 133/77, pulse 67, temperature 97.8 F (36.6 C), temperature source Oral, resp. rate 18,  height 5\' 4"  (1.626 m), weight 76.4 kg (168 lb 6.9 oz), last menstrual period 09/02/2015, SpO2 100 %. General exam: Appears calm and comfortable  Respiratory system: Clear to auscultation. Respiratory effort normal. Cardiovascular system: S1 & S2 heard, RRR. No JVD, murmurs, rubs, gallops or clicks. No pedal edema. Gastrointestinal system: Abdomen is nondistended, soft and nontender. No organomegaly or masses felt. Normal bowel sounds heard. Central nervous system: Alert and oriented. No focal neurological deficits. Motor: left UE 3+/5 in deltoid with 4/5 in distal strength. Right UE 5/5, bilateral LE 5/5 Sensory: intact throughout Skin: No rashes, lesions or ulcers Psychiatry: Judgement and insight appear normal. Mood & affect appropriate.      Follow-up Information    Follow up with [facility] On 09/14/2015.   Why:  [phone]. Your appointment time is 11:00 am. Please arrive 15 min early and bring a picture ID and your current meds.    Contact information:   [address] [place] [address]       Follow up with [facility].   Specialty:  Rehabilitation   Why:  They will contact you to set up the appointment.    Contact information:   [address] [id number] mc [place] [address] [phone]      Follow up with Neurology. Schedule an appointment as soon as possible for a visit in 3 days.   Contact information:      [facility]  Website  Directions    4.7 10  Google reviews   Loading...       Neurologist in [place], [place]        Address: [address], [place], [place] [address]    Phone: [phone]      Signed: Richarda Overlie 09/07/2015, 11:53 AM        Time spent >45 mins

## 2015-09-07 NOTE — Progress Notes (Signed)
Pt discharged at this time taking all personal belongings. IV discontinued, dry dressing applied. Discharge instructions provided with verbal understanding. Pt aware of follow up appts. No noted distress. Pt denies pain or discomfort.

## 2015-09-14 ENCOUNTER — Ambulatory Visit (INDEPENDENT_AMBULATORY_CARE_PROVIDER_SITE_OTHER): Payer: Self-pay | Admitting: Family Medicine

## 2015-09-14 ENCOUNTER — Encounter: Payer: Self-pay | Admitting: Family Medicine

## 2015-09-14 VITALS — BP 115/65 | HR 101 | Temp 97.6°F | Resp 14 | Ht 64.0 in | Wt 177.0 lb

## 2015-09-14 DIAGNOSIS — R7309 Other abnormal glucose: Secondary | ICD-10-CM

## 2015-09-14 DIAGNOSIS — R739 Hyperglycemia, unspecified: Secondary | ICD-10-CM

## 2015-09-14 DIAGNOSIS — Z7689 Persons encountering health services in other specified circumstances: Secondary | ICD-10-CM

## 2015-09-14 DIAGNOSIS — N898 Other specified noninflammatory disorders of vagina: Secondary | ICD-10-CM | POA: Insufficient documentation

## 2015-09-14 DIAGNOSIS — Z7189 Other specified counseling: Secondary | ICD-10-CM

## 2015-09-14 DIAGNOSIS — G35 Multiple sclerosis: Secondary | ICD-10-CM

## 2015-09-14 LAB — COMPLETE METABOLIC PANEL WITH GFR
ALT: 44 U/L — ABNORMAL HIGH (ref 6–29)
AST: 25 U/L (ref 10–30)
Albumin: 4 g/dL (ref 3.6–5.1)
Alkaline Phosphatase: 45 U/L (ref 33–115)
BUN: 20 mg/dL (ref 7–25)
CO2: 27 mmol/L (ref 20–31)
Calcium: 9.3 mg/dL (ref 8.6–10.2)
Chloride: 101 mmol/L (ref 98–110)
Creat: 0.59 mg/dL (ref 0.50–1.10)
GFR, Est African American: >89 mL/min (ref >=60)
GFR, Est Non African American: >89 mL/min (ref >=60)
Glucose, Bld: 96 mg/dL (ref 65–99)
Potassium: 4.2 mmol/L (ref 3.5–5.3)
Sodium: 138 mmol/L (ref 135–146)
Total Bilirubin: 0.3 mg/dL (ref 0.2–1.2)
Total Protein: 6.6 g/dL (ref 6.1–8.1)

## 2015-09-14 LAB — CBC WITH DIFFERENTIAL/PLATELET
Basophils Absolute: 0 cells/uL (ref 0–200)
Basophils Relative: 0 %
Eosinophils Absolute: 0 cells/uL — ABNORMAL LOW (ref 15–500)
Eosinophils Relative: 0 %
HCT: 42.2 % (ref 35.0–45.0)
Hemoglobin: 13.7 g/dL (ref 11.7–15.5)
Lymphocytes Relative: 13 %
Lymphs Abs: 1937 cells/uL (ref 850–3900)
MCH: 28 pg (ref 27.0–33.0)
MCHC: 32.5 g/dL (ref 32.0–36.0)
MCV: 86.1 fL (ref 80.0–100.0)
MPV: 10.5 fL (ref 7.5–12.5)
Monocytes Absolute: 1043 cells/uL — ABNORMAL HIGH (ref 200–950)
Monocytes Relative: 7 %
Neutro Abs: 11920 cells/uL — ABNORMAL HIGH (ref 1500–7800)
Neutrophils Relative %: 80 %
Platelets: 343 10*3/uL (ref 140–400)
RBC: 4.9 MIL/uL (ref 3.80–5.10)
RDW: 14 % (ref 11.0–15.0)
WBC: 14.9 10*3/uL — ABNORMAL HIGH (ref 3.8–10.8)

## 2015-09-14 NOTE — Progress Notes (Signed)
Patient ID: Kelly Benson, female   DOB: [birth year], 20 y.o.   MRN: [id number]   Donis Amado, is a 20 y.o. female  [id number]  [id number]  DOB - 16-Dec-1995  CC:  Chief Complaint  Patient presents with  . Establish Care  . Hospitalization Follow-up    dx of MS        HPI: Kelly Benson is a 20 y.o. female here to establish care. She was recently in hospital and diagnosed with multiple sclerosis. She has a pending appointment with neurologist. She was asked to come here to establish care with PCP. She is not currently on medication. She was initially treated in hospital with cortisone. During that time she had a blood sugar of 207. Prior to that her blood sugars has been around 100. Her bloodwork while in hospital did show anemia. She currently does not have any insurance but is applying for medicaid. She denies other chronic health problems. She does complain today of some vaginal discharge with intermittent itching. She reports being diagnosed and [facility]. Would like that rechecked.   No Known Allergies Past Medical History  Diagnosis Date  . Scoliosis   . MS (multiple sclerosis) (HCC)    Current Outpatient Prescriptions on File Prior to Visit  Medication Sig Dispense Refill  . pantoprazole (PROTONIX) 40 MG tablet Take 1 tablet (40 mg total) by mouth daily. (Patient not taking: Reported on 09/14/2015) 30 tablet 1   No current facility-administered medications on file prior to visit.   Family History  Problem Relation Age of Onset  . Hypertension Father    Social History   Social History  . Marital Status: Married    Spouse Name: N/A  . Number of Children: N/A  . Years of Education: N/A   Occupational History  . Not on file.   Social History Main Topics  . Smoking status: Former Smoker    Types: Cigarettes  . Smokeless tobacco: Not on file  . Alcohol Use: No  . Drug Use: No  . Sexual Activity: Yes   Birth Control/ Protection: None   Other Topics Concern  . Not on file   Social History Narrative    Review of Systems: Constitutional: Negative for fever, chills, weight loss. She does admit to some appetite loss and fatigue. Skin: Positve papular rash on forehead, chest and upper back. This developed with on cortisone HENT: Negative for ear pain, ear discharge.nose bleeds Eyes: Negative for pain, discharge, redness, itching. Positive for blurred vision and light sensitivity Neck: Negative for pain, stiffness Respiratory: Negative for cough. Positive for occasional shortness of breath and wheezing. No history of asthma  Cardiovascular: Negative for chest pain, palpitations and leg swelling. Gastrointestinal: Negative for abdominal pain, nausea, vomiting, constipation. Positive for some diarrhea Genitourinary: Negative for dysuria, urgency, hematuria. Positive for frequency  Musculoskeletal: Positive for lower  back pain, joint pain, joint  swelling, and gait problem.Positive for weakness on right side Neurological: Negative for  tremors, seizures, syncope. Positive for dizziness and headaches.  Hematological: Negative for easy bruising or bleeding Psychiatric/Behavioral: Negative for depression, anxiety, decreased concentration, confusion   Objective:   Filed Vitals:   09/14/15 1044  BP: 115/65  Pulse: 101  Temp: 97.6 F (36.4 C)  Resp: 14    Physical Exam: Constitutional: Patient appears well-developed and well-nourished. No distress. HENT: Normocephalic, atraumatic, External right and left ear normal. Oropharynx is clear and moist.  Eyes: Conjunctivae and EOM are normal. PERRLA,  no scleral icterus. Neck: Normal ROM. Neck supple. No lymphadenopathy, No thyromegaly. CVS: RRR, S1/S2 +, no murmurs, no gallops, no rubs Pulmonary: Effort and breath sounds normal, no stridor, rhonchi, wheezes, rales.  Abdominal: Soft. Normoactive BS,, no distension, tenderness, rebound or  guarding.  Musculoskeletal: Normal range of motion. No edema and no tenderness.  Neuro: Alert. Non-focal. Full neuro exam deferred as has follow-up with neurologist. Skin: Skin is warm and dry. No rash noted. Not diaphoretic. No erythema. No pallor. Psychiatric: Normal mood and affect. Behavior, judgment, thought content normal.  Lab Results  Component Value Date   WBC 23.0* 09/05/2015   HGB 12.6 09/05/2015   HCT 38.2 09/05/2015   MCV 84.5 09/05/2015   PLT 291 09/05/2015   Lab Results  Component Value Date   CREATININE 0.80 09/05/2015   BUN 15 09/05/2015   NA 137 09/05/2015   K 3.6 09/05/2015   CL 105 09/05/2015   CO2 22 09/05/2015    No results found for: HGBA1C Lipid Panel  No results found for: CHOL, TRIG, HDL, CHOLHDL, VLDL, LDLCALC     Assessment and plan:   1. Multiple sclerosis (HCC)  - COMPLETE METABOLIC PANEL WITH GFR - CBC with Differential  2. Encounter to establish care -I have reviewed information presented by the patient and pertinent information in her chart.  3. Elevated blood sugar (while on steroids recently)  - COMPLETE METABOLIC PANEL WITH GFR  4. Vaginal Discharge with history of STI -we failed to collect today. Will call and have her return to collect urine for testing.   Return in about 6 months (around 03/16/2016).  The patient was given clear instructions to go to ER or return to medical center if symptoms don't improve, worsen or new problems develop. The patient verbalized understanding.    Henrietta Hoover FNP  09/14/2015, 12:18 PM

## 2015-09-14 NOTE — Patient Instructions (Signed)
Follow-up for PAP smear and immunizations when Medicaid is in place. Follow-up with neurologist as planned.

## 2015-09-15 ENCOUNTER — Ambulatory Visit: Payer: MEDICAID | Admitting: Neurology

## 2015-09-15 ENCOUNTER — Telehealth: Payer: Self-pay

## 2015-09-15 NOTE — Telephone Encounter (Signed)
-----   Message from Henrietta Hoover, NP sent at 09/15/2015  8:04 AM EDT ----- Alt slightly elevated. Will recheck in 1 months.White blood count down from 23 to 14. Recheck in one month. Absolute neutro and monos elevated

## 2015-09-15 NOTE — Telephone Encounter (Signed)
Tried to call, no answer. Left message asking patient to call back. Thanks!

## 2015-09-18 NOTE — Telephone Encounter (Signed)
Tried to call, no answer. Will mail letter. Thanks!

## 2015-09-25 ENCOUNTER — Ambulatory Visit: Payer: Self-pay | Attending: Internal Medicine | Admitting: Physical Therapy

## 2015-09-29 ENCOUNTER — Telehealth: Payer: Self-pay | Admitting: *Deleted

## 2015-09-29 NOTE — Telephone Encounter (Signed)
Kelly Benson [facility]           CID [id number]  Patient SAME                 Pt's Dr NEW PATIENT  Area Code 704 Phone# [phone] * DOB [birth year]     RE PLEASE CALL REGARDING APPT SHE CANCELLED  Pt did not want to r/s.

## 2015-10-20 ENCOUNTER — Other Ambulatory Visit: Payer: Self-pay | Admitting: Family Medicine

## 2015-10-20 ENCOUNTER — Other Ambulatory Visit: Payer: Self-pay

## 2015-10-20 DIAGNOSIS — R740 Nonspecific elevation of levels of transaminase and lactic acid dehydrogenase [LDH]: Principal | ICD-10-CM

## 2015-10-20 DIAGNOSIS — R7401 Elevation of levels of liver transaminase levels: Secondary | ICD-10-CM

## 2015-10-20 LAB — HEPATIC FUNCTION PANEL
ALT: 12 U/L (ref 6–29)
AST: 12 U/L (ref 10–30)
Albumin: 4.3 g/dL (ref 3.6–5.1)
Alkaline Phosphatase: 53 U/L (ref 33–115)
Bilirubin, Direct: 0.1 mg/dL (ref <=0.2)
Indirect Bilirubin: 0.4 mg/dL (ref 0.2–1.2)
Total Bilirubin: 0.5 mg/dL (ref 0.2–1.2)
Total Protein: 7.5 g/dL (ref 6.1–8.1)

## 2016-01-30 ENCOUNTER — Other Ambulatory Visit (HOSPITAL_COMMUNITY)
Admission: RE | Admit: 2016-01-30 | Discharge: 2016-01-30 | Disposition: A | Discharge status: Home / Self Care | Payer: Self-pay | Source: Ambulatory Visit | Attending: Family Medicine | Admitting: Family Medicine

## 2016-01-30 ENCOUNTER — Encounter: Payer: Self-pay | Admitting: Family Medicine

## 2016-01-30 ENCOUNTER — Ambulatory Visit (INDEPENDENT_AMBULATORY_CARE_PROVIDER_SITE_OTHER): Payer: Self-pay | Admitting: Family Medicine

## 2016-01-30 VITALS — BP 105/91 | HR 75 | Temp 98.3°F | Resp 16 | Ht 64.0 in | Wt 173.0 lb

## 2016-01-30 DIAGNOSIS — N898 Other specified noninflammatory disorders of vagina: Secondary | ICD-10-CM

## 2016-01-30 DIAGNOSIS — N760 Acute vaginitis: Secondary | ICD-10-CM | POA: Insufficient documentation

## 2016-01-30 DIAGNOSIS — Z113 Encounter for screening for infections with a predominantly sexual mode of transmission: Secondary | ICD-10-CM | POA: Insufficient documentation

## 2016-01-30 DIAGNOSIS — G35 Multiple sclerosis: Secondary | ICD-10-CM

## 2016-01-31 LAB — POCT URINALYSIS DIP (DEVICE)
Bilirubin Urine: NEGATIVE
Glucose, UA: NEGATIVE mg/dL
Hgb urine dipstick: NEGATIVE
Ketones, ur: NEGATIVE mg/dL
Leukocytes, UA: NEGATIVE
Nitrite: NEGATIVE
Protein, ur: NEGATIVE mg/dL
Specific Gravity, Urine: 1.02 (ref 1.005–1.030)
Urobilinogen, UA: 0.2 mg/dL (ref 0.0–1.0)
pH: 7 (ref 5.0–8.0)

## 2016-02-01 LAB — URINE CYTOLOGY ANCILLARY ONLY
Chlamydia: NEGATIVE
Neisseria Gonorrhea: NEGATIVE
Trichomonas: NEGATIVE

## 2016-02-03 ENCOUNTER — Encounter: Payer: Self-pay | Admitting: Family Medicine

## 2016-02-05 LAB — URINE CYTOLOGY ANCILLARY ONLY
Bacterial vaginitis: NEGATIVE
Candida vaginitis: NEGATIVE

## 2016-03-22 ENCOUNTER — Ambulatory Visit (INDEPENDENT_AMBULATORY_CARE_PROVIDER_SITE_OTHER): Payer: Self-pay | Admitting: Family Medicine

## 2016-03-22 ENCOUNTER — Other Ambulatory Visit (HOSPITAL_COMMUNITY)
Admission: RE | Admit: 2016-03-22 | Discharge: 2016-03-22 | Disposition: A | Discharge status: Home / Self Care | Payer: Self-pay | Source: Ambulatory Visit | Attending: Family Medicine | Admitting: Family Medicine

## 2016-03-22 ENCOUNTER — Encounter: Payer: Self-pay | Admitting: Family Medicine

## 2016-03-22 VITALS — BP 121/67 | Ht 64.0 in | Wt 182.0 lb

## 2016-03-22 DIAGNOSIS — Z124 Encounter for screening for malignant neoplasm of cervix: Secondary | ICD-10-CM

## 2016-03-22 DIAGNOSIS — Z0000 Encounter for general adult medical examination without abnormal findings: Secondary | ICD-10-CM

## 2016-03-22 DIAGNOSIS — Z1151 Encounter for screening for human papillomavirus (HPV): Secondary | ICD-10-CM | POA: Insufficient documentation

## 2016-03-22 DIAGNOSIS — R8290 Unspecified abnormal findings in urine: Secondary | ICD-10-CM

## 2016-03-22 DIAGNOSIS — Z113 Encounter for screening for infections with a predominantly sexual mode of transmission: Secondary | ICD-10-CM

## 2016-03-22 DIAGNOSIS — Z01419 Encounter for gynecological examination (general) (routine) without abnormal findings: Secondary | ICD-10-CM | POA: Insufficient documentation

## 2016-03-22 LAB — CBC WITH DIFFERENTIAL/PLATELET
Basophils Absolute: 0 cells/uL (ref 0–200)
Basophils Relative: 0 %
Eosinophils Absolute: 65 cells/uL (ref 15–500)
Eosinophils Relative: 1 %
HCT: 38.8 % (ref 35.0–45.0)
Hemoglobin: 12.6 g/dL (ref 11.7–15.5)
Lymphocytes Relative: 20 %
Lymphs Abs: 1300 cells/uL (ref 850–3900)
MCH: 27.4 pg (ref 27.0–33.0)
MCHC: 32.5 g/dL (ref 32.0–36.0)
MCV: 84.3 fL (ref 80.0–100.0)
MPV: 10.9 fL (ref 7.5–12.5)
Monocytes Absolute: 650 cells/uL (ref 200–950)
Monocytes Relative: 10 %
Neutro Abs: 4485 cells/uL (ref 1500–7800)
Neutrophils Relative %: 69 %
Platelets: 292 10*3/uL (ref 140–400)
RBC: 4.6 MIL/uL (ref 3.80–5.10)
RDW: 14 % (ref 11.0–15.0)
WBC: 6.5 10*3/uL (ref 3.8–10.8)

## 2016-03-22 LAB — COMPLETE METABOLIC PANEL WITH GFR
ALT: 9 U/L (ref 6–29)
AST: 10 U/L (ref 10–30)
Albumin: 4.1 g/dL (ref 3.6–5.1)
Alkaline Phosphatase: 57 U/L (ref 33–115)
BUN: 14 mg/dL (ref 7–25)
CO2: 26 mmol/L (ref 20–31)
Calcium: 9.4 mg/dL (ref 8.6–10.2)
Chloride: 105 mmol/L (ref 98–110)
Creat: 0.61 mg/dL (ref 0.50–1.10)
GFR, Est African American: >89 mL/min (ref >=60)
GFR, Est Non African American: >89 mL/min (ref >=60)
Glucose, Bld: 92 mg/dL (ref 65–99)
Potassium: 4.1 mmol/L (ref 3.5–5.3)
Sodium: 139 mmol/L (ref 135–146)
Total Bilirubin: 0.4 mg/dL (ref 0.2–1.2)
Total Protein: 6.7 g/dL (ref 6.1–8.1)

## 2016-03-22 NOTE — Progress Notes (Signed)
Patient is here for 6 month FU  Patient is not taking any current medications. Patient has not eaten today.  Patient denies pain at this time.

## 2016-03-22 NOTE — Progress Notes (Signed)
Kelly Benson, is a 20 y.o. female  [id number]  [id number]  DOB - 07/26/1995  CC:  Chief Complaint  Patient presents with  . Follow-up       HPI: Kelly Benson is a 20 y.o. female here for routine follow-up. She has multiple sclerosis and is followed by neurology. Her major concern is what she perceives to be an abnormal vaginal discharge and odiferous urine. We have screen her before for STI and none found. This discharge has been a long term issue for her. She is currently on no medications. She is in need of PAP and will do that today. She declines flu shot today.    No Known Allergies Past Medical History:  Diagnosis Date  . MS (multiple sclerosis) (HCC)   . Scoliosis    No current outpatient prescriptions on file prior to visit.   No current facility-administered medications on file prior to visit.    Family History  Problem Relation Age of Onset  . Hypertension Father    Social History   Social History  . Marital status: Married    Spouse name: N/A  . Number of children: N/A  . Years of education: N/A   Occupational History  . Not on file.   Social History Main Topics  . Smoking status: Former Smoker    Types: Cigarettes  . Smokeless tobacco: Never Used  . Alcohol use No  . Drug use: No  . Sexual activity: Yes    Birth control/ protection: None   Other Topics Concern  . Not on file   Social History Narrative  . No narrative on file    Review of Systems: Constitutional: Negative Skin: Negative HENT: Negative  Eyes: Negative  Neck: Negative Respiratory: Negative Cardiovascular: Negative Gastrointestinal: Negative Genitourinary: Negative  Musculoskeletal: Negative   Neurological: Negative  Hematological: Negative  Psychiatric/Behavioral: Negative    Objective:   Vitals:   03/22/16 0951  BP: 121/67    Physical Exam: Constitutional: Patient appears well-developed and well-nourished. No distress. HENT: Normocephalic,  atraumatic, External right and left ear normal. Oropharynx is clear and moist.  Eyes: Conjunctivae and EOM are normal. PERRLA, no scleral icterus. Neck: Normal ROM. Neck supple. No lymphadenopathy, No thyromegaly. CVS: RRR, S1/S2 +, no murmurs, no gallops, no rubs Pulmonary: Effort and breath sounds normal, no stridor, rhonchi, wheezes, rales.  Abdominal: Soft. Normoactive BS,, no distension, tenderness, rebound or guarding.  Musculoskeletal: Normal range of motion. No edema and no tenderness.  Neuro: Alert.Normal muscle tone coordination. Non-focal Skin: Skin is warm and dry. No rash noted. Not diaphoretic. No erythema. No pallor. Psychiatric: Normal mood and affect. Behavior, judgment, thought content normal. GYN: cervix midline, no unusual discharge, no CMT, No adnexal tenderness.  Lab Results  Component Value Date   WBC 14.9 (H) 09/14/2015   HGB 13.7 09/14/2015   HCT 42.2 09/14/2015   MCV 86.1 09/14/2015   PLT 343 09/14/2015   Lab Results  Component Value Date   CREATININE 0.59 09/14/2015   BUN 20 09/14/2015   NA 138 09/14/2015   K 4.2 09/14/2015   CL 101 09/14/2015   CO2 27 09/14/2015    No results found for: HGBA1C Lipid Panel  No results found for: CHOL, TRIG, HDL, CHOLHDL, VLDL, LDLCALC      Assessment and plan:   1. Screening for cervical cancer  - Cytology - PAP [facility]  2. Health care maintenance  - Hemoglobin A1c - COMPLETE METABOLIC PANEL WITH GFR - CBC with Differential  3. Routine screening for STI (sexually transmitted infection)  - RPR  4. Abnormal urine odor  - Urine culture    The patient was given clear instructions to go to ER or return to medical center if symptoms don't improve, worsen or new problems develop. The patient verbalized understanding.    Henrietta Hoover FNP  03/22/2016, 2:01 PM

## 2016-03-23 LAB — HEMOGLOBIN A1C
Hgb A1c MFr Bld: 5.2 % (ref <5.7)
Mean Plasma Glucose: 103 mg/dL

## 2016-03-23 LAB — RPR

## 2016-03-24 LAB — URINE CULTURE: Organism ID, Bacteria: NO GROWTH

## 2016-03-26 LAB — CYTOLOGY - PAP
Chlamydia: NEGATIVE
Diagnosis: NEGATIVE
HPV: NOT DETECTED
Neisseria Gonorrhea: NEGATIVE
Trichomonas: NEGATIVE

## 2016-03-27 LAB — CERVICOVAGINAL ANCILLARY ONLY
Bacterial vaginitis: NEGATIVE
Candida vaginitis: POSITIVE — AB

## 2016-04-12 NOTE — Progress Notes (Signed)
No show

## 2016-04-29 NOTE — Progress Notes (Signed)
   Kelly Benson, is a 20 y.o. female  [id number]  [id number]  DOB - February 14, 1996  CC: No chief complaint on file.      HPI: Kelly Benson is a 20 y.o. female here for sick visit for vaginal discharge. She would like to be checked for STIS.   No Known Allergies Past Medical History:  Diagnosis Date  . MS (multiple sclerosis) (HCC)   . Scoliosis    No current outpatient prescriptions on file prior to visit.   No current facility-administered medications on file prior to visit.    Family History  Problem Relation Age of Onset  . Hypertension Father    Social History   Social History  . Marital status: Married    Spouse name: N/A  . Number of children: N/A  . Years of education: N/A   Occupational History  . Not on file.   Social History Main Topics  . Smoking status: Former Smoker    Types: Cigarettes  . Smokeless tobacco: Never Used  . Alcohol use No  . Drug use: No  . Sexual activity: Yes    Birth control/ protection: None   Other Topics Concern  . Not on file   Social History Narrative  . No narrative on file    Review of Systems: Constitutional: Negative Skin: Negative HENT: Negative  Eyes: Negative  Neck: Negative Respiratory: Negative Cardiovascular: Negative Gastrointestinal: Negative Genitourinary: + for vaginal discharge Musculoskeletal: Negative   Neurological: Negative for Hematological: Negative  Psychiatric/Behavioral: Negative    Objective:  There were no vitals filed for this visit.  Physical Exam: Constitutional: Patient appears well-developed and well-nourished. No distress. HENT: Normocephalic, atraumatic, External right and left ear normal. Oropharynx is clear and moist.  Eyes: Conjunctivae and EOM are normal. PERRLA, no scleral icterus. Neck: Normal ROM. Neck supple. No lymphadenopathy, No thyromegaly. CVS: RRR, S1/S2 +, no murmurs, no gallops, no rubs Pulmonary: Effort and breath sounds normal, no stridor,  rhonchi, wheezes, rales.  Abdominal: Soft. Normoactive BS,, no distension, tenderness, rebound or guarding.  Musculoskeletal: Normal range of motion. No edema and no tenderness. No suprapublic tenderness. Neuro: Alert.Normal muscle tone coordination. Non-focal Skin: Skin is warm and dry. No rash noted. Not diaphoretic. No erythema. No pallor. Psychiatric: Normal mood and affect. Behavior, judgment, thought content normal.  Lab Results  Component Value Date   WBC 6.5 03/22/2016   HGB 12.6 03/22/2016   HCT 38.8 03/22/2016   MCV 84.3 03/22/2016   PLT 292 03/22/2016   Lab Results  Component Value Date   CREATININE 0.61 03/22/2016   BUN 14 03/22/2016   NA 139 03/22/2016   K 4.1 03/22/2016   CL 105 03/22/2016   CO2 26 03/22/2016    Lab Results  Component Value Date   HGBA1C 5.2 03/22/2016   Lipid Panel  No results found for: CHOL, TRIG, HDL, CHOLHDL, VLDL, LDLCALC      Assessment and plan:   1. Vaginal discharge  - Urine cytology ancillary only   Return if symptoms worsen or fail to improve.  The patient was given clear instructions to go to ER or return to medical center if symptoms don't improve, worsen or new problems develop. The patient verbalized understanding.    Henrietta HooverLinda C Melicia Esqueda FNP  04/29/2016, 4:21 PM

## 2016-06-10 ENCOUNTER — Inpatient Hospital Stay (HOSPITAL_COMMUNITY)
Admission: EM | Admit: 2016-06-10 | Discharge: 2016-06-14 | DRG: 060 | Disposition: A | Discharge status: Home / Self Care | Payer: Self-pay | Attending: Nephrology | Admitting: Nephrology

## 2016-06-10 ENCOUNTER — Encounter (HOSPITAL_COMMUNITY): Payer: Self-pay | Admitting: Emergency Medicine

## 2016-06-10 DIAGNOSIS — Z87891 Personal history of nicotine dependence: Secondary | ICD-10-CM

## 2016-06-10 DIAGNOSIS — Z598 Other problems related to housing and economic circumstances: Secondary | ICD-10-CM

## 2016-06-10 DIAGNOSIS — R202 Paresthesia of skin: Secondary | ICD-10-CM | POA: Diagnosis present

## 2016-06-10 DIAGNOSIS — G35 Multiple sclerosis: Principal | ICD-10-CM | POA: Diagnosis present

## 2016-06-10 DIAGNOSIS — M419 Scoliosis, unspecified: Secondary | ICD-10-CM | POA: Diagnosis present

## 2016-06-10 DIAGNOSIS — R42 Dizziness and giddiness: Secondary | ICD-10-CM | POA: Diagnosis present

## 2016-06-10 DIAGNOSIS — Z5989 Other problems related to housing and economic circumstances: Secondary | ICD-10-CM

## 2016-06-10 DIAGNOSIS — Z5971 Insufficient health insurance coverage: Secondary | ICD-10-CM

## 2016-06-10 DIAGNOSIS — G35D Multiple sclerosis, unspecified: Secondary | ICD-10-CM

## 2016-06-10 LAB — CBC
HCT: 40.2 % (ref 36.0–46.0)
Hemoglobin: 13 g/dL (ref 12.0–15.0)
MCH: 26.6 pg (ref 26.0–34.0)
MCHC: 32.3 g/dL (ref 30.0–36.0)
MCV: 82.4 fL (ref 78.0–100.0)
Platelets: 310 10*3/uL (ref 150–400)
RBC: 4.88 MIL/uL (ref 3.87–5.11)
RDW: 12.7 % (ref 11.5–15.5)
WBC: 6 10*3/uL (ref 4.0–10.5)

## 2016-06-10 LAB — URINALYSIS, ROUTINE W REFLEX MICROSCOPIC
Bilirubin Urine: NEGATIVE
Glucose, UA: 50 mg/dL — AB
Hgb urine dipstick: NEGATIVE
Ketones, ur: NEGATIVE mg/dL
Leukocytes, UA: NEGATIVE
Nitrite: NEGATIVE
Protein, ur: NEGATIVE mg/dL
Specific Gravity, Urine: 1.014 (ref 1.005–1.030)
pH: 7 (ref 5.0–8.0)

## 2016-06-10 LAB — BASIC METABOLIC PANEL
Anion gap: 11 (ref 5–15)
BUN: 11 mg/dL (ref 6–20)
CO2: 26 mmol/L (ref 22–32)
Calcium: 9.3 mg/dL (ref 8.9–10.3)
Chloride: 103 mmol/L (ref 101–111)
Creatinine, Ser: 0.6 mg/dL (ref 0.44–1.00)
GFR calc Af Amer: >60 mL/min (ref >60)
GFR calc non Af Amer: >60 mL/min (ref >60)
Glucose, Bld: 88 mg/dL (ref 65–99)
Potassium: 3.6 mmol/L (ref 3.5–5.1)
Sodium: 140 mmol/L (ref 135–145)

## 2016-06-10 NOTE — ED Triage Notes (Addendum)
Pt reports hx of MS, does not take meds or see a neurologist, reports she has been having dizziness and slurred speech x4 days. Pt a/o, resp e/u, speech clear, pt steady on her feet during ambulation to triage.

## 2016-06-10 NOTE — ED Provider Notes (Signed)
[facility]-EMERGENCY DEPT Provider Note   CSN: [id number] Arrival date & time: 06/10/16  1537   By signing my name below, I, Clarisse Gouge, attest that this documentation has been prepared under the direction and in the presence of Tomasita Crumble, MD. Electronically signed, Clarisse Gouge, ED Scribe. 06/10/16. 11:57 PM.   History   Chief Complaint Chief Complaint  Patient presents with  . Dizziness   The history is provided by the patient and medical records. No language interpreter was used.    HPI Comments: Kelly Benson is a 21 y.o. female with Hx of multiple sclerosis who presents to the Emergency Department complaining of intermittent Dizziness x 5 days. She reports associated tingling in the last two digits and toes on the right side, worsening blurred vision and a cold sensation on the right front side of the head. Pt states she is not on any every day medications and she has no neurologist because she has no insurance and cannot afford treatment. She states this is similar to her prior attacks when she required admission and IV steroids.  Pt denies injury and FMHx of mutliple sclerosis.  Past Medical History:  Diagnosis Date  . MS (multiple sclerosis) (HCC)   . Scoliosis     Patient Active Problem List   Diagnosis Date Noted  . Vaginal discharge 09/14/2015  . Multiple sclerosis (HCC)   . Dizziness   . Demyelination, corpus callosum central (HCC)   . MS (multiple sclerosis) (HCC) 09/02/2015    History reviewed. No pertinent surgical history.  OB History    No data available       Home Medications    Prior to Admission medications   Not on File    Family History Family History  Problem Relation Age of Onset  . Hypertension Father     Social History Social History  Substance Use Topics  . Smoking status: Former Smoker    Types: Cigarettes  . Smokeless tobacco: Never Used  . Alcohol use No     Allergies   Patient has no known allergies.   Review  of Systems Review of Systems  All other systems reviewed and are negative.  A complete 10 system review of systems was obtained and all systems are negative except as noted in the HPI and PMH.    Physical Exam Updated Vital Signs BP 121/66   Pulse 64   Temp 98.1 F (36.7 C) (Oral)   Resp 18   Ht 5\' 4"  (1.626 m)   Wt 175 lb (79.4 kg)   LMP 05/26/2016   SpO2 96%   BMI 30.04 kg/m   Physical Exam  Constitutional: She is oriented to person, place, and time. She appears well-developed and well-nourished. No distress.  HENT:  Head: Normocephalic and atraumatic.  Nose: Nose normal.  Mouth/Throat: Oropharynx is clear and moist. No oropharyngeal exudate.  Eyes: Conjunctivae and EOM are normal. Pupils are equal, round, and reactive to light. No scleral icterus.  Neck: Normal range of motion. Neck supple. No JVD present. No tracheal deviation present. No thyromegaly present.  Cardiovascular: Normal rate, regular rhythm and normal heart sounds.  Exam reveals no gallop and no friction rub.   No murmur heard. Pulmonary/Chest: Effort normal and breath sounds normal. No respiratory distress. She has no wheezes. She exhibits no tenderness.  Abdominal: Soft. Bowel sounds are normal. She exhibits no distension and no mass. There is no tenderness. There is no rebound and no guarding.  Musculoskeletal: Normal range of motion.  She exhibits no edema or tenderness.  Lymphadenopathy:    She has no cervical adenopathy.  Neurological: She is alert and oriented to person, place, and time. No cranial nerve deficit. She exhibits normal muscle tone.  NL strength and sensation in all extremities; NL cerebellar testing  Skin: Skin is warm and dry. No rash noted. No erythema. No pallor.  Nursing note and vitals reviewed.    ED Treatments / Results  DIAGNOSTIC STUDIES: Oxygen Saturation is 96% on RA, normal by my interpretation.    COORDINATION OF CARE: 11:54 PM Discussed treatment plan with pt at bedside  and pt agreed to plan.  Labs (all labs ordered are listed, but only abnormal results are displayed) Labs Reviewed  URINALYSIS, ROUTINE W REFLEX MICROSCOPIC - Abnormal; Notable for the following:       Result Value   Color, Urine STRAW (*)    Glucose, UA 50 (*)    All other components within normal limits  BASIC METABOLIC PANEL  CBC  CBG MONITORING, ED    EKG  EKG Interpretation  Date/Time:  Monday June 10 2016 16:09:38 EST Ventricular Rate:  73 PR Interval:  162 QRS Duration: 88 QT Interval:  358 QTC Calculation: 394 R Axis:   86 Text Interpretation:  Normal sinus rhythm Normal ECG No old tracing to compare Confirmed by Erroll Luna [phone]) on 06/11/2016 12:09:47 AM       Radiology Mr Brain W And Wo Contrast  Result Date: 06/11/2016 CLINICAL DATA:  Intermittent dizziness for 5 days. History of multiple sclerosis. EXAM: MRI HEAD WITHOUT AND WITH CONTRAST MRI CERVICAL SPINE WITHOUT AND WITH CONTRAST TECHNIQUE: Multiplanar, multiecho pulse sequences of the brain and surrounding structures, and cervical spine, to include the craniocervical junction and cervicothoracic junction, were obtained without and with intravenous contrast. CONTRAST:  17mL MULTIHANCE GADOBENATE DIMEGLUMINE 529 MG/ML IV SOLN COMPARISON:  MRI head and cervical spine September 03, 2015 FINDINGS: MRI HEAD FINDINGS INTRACRANIAL CONTENTS: No reduced diffusion to suggest acute ischemia or hyperacute demyelination. T2 shine through associated with multiple white matter lesions. Stable subcentimeter posterior medullary lesion, less conspicuous RIGHT cerebellar lesion. Greater than expected FLAIR hyperintense signal within the periaqueductal gray, similar. At least 3 new supratentorial white matter lesions in a periventricular distribution. Greater than 10 supratentorial white matter lesions including bilateral temporal lobes, many of which radiate from the periventricular margin. Subcentimeter LEFT temporal and LEFT  lesions demonstrate low T1 signal compatible with black holes of demyelination. Frontal cortical to juxta cortical lesions. Varying faint enhancement of a few supratentorial lesions. Less conspicuous RIGHT and similar LEFT thalamus lesions. Ventricles and sulci normal for patient's age. No midline shift, mass effect or masses. No abnormal extra-axial fluid collections, enhancement or masses. VASCULAR: Normal major intracranial vascular flow voids present at skull base. SKULL AND UPPER CERVICAL SPINE: No abnormal sellar expansion. No suspicious calvarial bone marrow signal. Craniocervical junction maintained. SINUSES/ORBITS: The mastoid air-cells and included paranasal sinuses are well-aerated.The included ocular globes and orbital contents are non-suspicious. Optic neuritis could not excluded on the basis of old brain MRI. OTHER: None. MRI CERVICAL SPINE FINDINGS- moderately motion degraded axial post gadolinium, additional move mildly motion degraded axial pre contrast sequences. ALIGNMENT: Straightened cervical lordosis.  No malalignment. VERTEBRAE/DISCS: Vertebral bodies are intact. 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 Intervertebral disc morphology's and signal are normal. No abnormal bone marrow signal. No abnormal osseous or intradiscal enhancement. CORD:Due to mild patient motion, limited assessment for cord signal abnormality on the axial sequences. However, patchy bright STIR  signal within the cervical spinal cord at C2-3 with mild expansion. To lesser extent patchy non expansile bright STIR signal at C4, C5 and C7-T1. No abnormal enhancement. POSTERIOR FOSSA, VERTEBRAL ARTERIES, PARASPINAL TISSUES: No MR findings of ligamentous injury. Vertebral artery flow voids present. Included posterior fossa and paraspinal soft tissues are normal. DISC LEVELS: C2-3 through C7-T1: No disc bulge, canal stenosis nor neural foraminal narrowing. IMPRESSION: MRI HEAD: At least 3 new supratentorial white matter lesions compatible with  progression of demyelination. Variable enhancement compatible with acute to subacute lesions in a background of chronic supra- and infratentorial demyelination. No parenchymal brain volume loss for age. MRI CERVICAL SPINE:  Limited motion degraded examination. Multiple spinal cord plaques compatible with demyelination, relatively stable though limited assessment due to motion. No abnormal enhancement. No neurocompression or acute osseous process. Electronically Signed   By: Awilda Metro M.D.   On: 06/11/2016 02:31   Mr Cervical Spine W Or Wo Contrast  Result Date: 06/11/2016 CLINICAL DATA:  Intermittent dizziness for 5 days. History of multiple sclerosis. EXAM: MRI HEAD WITHOUT AND WITH CONTRAST MRI CERVICAL SPINE WITHOUT AND WITH CONTRAST TECHNIQUE: Multiplanar, multiecho pulse sequences of the brain and surrounding structures, and cervical spine, to include the craniocervical junction and cervicothoracic junction, were obtained without and with intravenous contrast. CONTRAST:  17mL MULTIHANCE GADOBENATE DIMEGLUMINE 529 MG/ML IV SOLN COMPARISON:  MRI head and cervical spine September 03, 2015 FINDINGS: MRI HEAD FINDINGS INTRACRANIAL CONTENTS: No reduced diffusion to suggest acute ischemia or hyperacute demyelination. T2 shine through associated with multiple white matter lesions. Stable subcentimeter posterior medullary lesion, less conspicuous RIGHT cerebellar lesion. Greater than expected FLAIR hyperintense signal within the periaqueductal gray, similar. At least 3 new supratentorial white matter lesions in a periventricular distribution. Greater than 10 supratentorial white matter lesions including bilateral temporal lobes, many of which radiate from the periventricular margin. Subcentimeter LEFT temporal and LEFT lesions demonstrate low T1 signal compatible with black holes of demyelination. Frontal cortical to juxta cortical lesions. Varying faint enhancement of a few supratentorial lesions. Less  conspicuous RIGHT and similar LEFT thalamus lesions. Ventricles and sulci normal for patient's age. No midline shift, mass effect or masses. No abnormal extra-axial fluid collections, enhancement or masses. VASCULAR: Normal major intracranial vascular flow voids present at skull base. SKULL AND UPPER CERVICAL SPINE: No abnormal sellar expansion. No suspicious calvarial bone marrow signal. Craniocervical junction maintained. SINUSES/ORBITS: The mastoid air-cells and included paranasal sinuses are well-aerated.The included ocular globes and orbital contents are non-suspicious. Optic neuritis could not excluded on the basis of old brain MRI. OTHER: None. MRI CERVICAL SPINE FINDINGS- moderately motion degraded axial post gadolinium, additional move mildly motion degraded axial pre contrast sequences. ALIGNMENT: Straightened cervical lordosis.  No malalignment. VERTEBRAE/DISCS: Vertebral bodies are intact. 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 Intervertebral disc morphology's and signal are normal. No abnormal bone marrow signal. No abnormal osseous or intradiscal enhancement. CORD:Due to mild patient motion, limited assessment for cord signal abnormality on the axial sequences. However, patchy bright STIR signal within the cervical spinal cord at C2-3 with mild expansion. To lesser extent patchy non expansile bright STIR signal at C4, C5 and C7-T1. No abnormal enhancement. POSTERIOR FOSSA, VERTEBRAL ARTERIES, PARASPINAL TISSUES: No MR findings of ligamentous injury. Vertebral artery flow voids present. Included posterior fossa and paraspinal soft tissues are normal. DISC LEVELS: C2-3 through C7-T1: No disc bulge, canal stenosis nor neural foraminal narrowing. IMPRESSION: MRI HEAD: At least 3 new supratentorial white matter lesions compatible with progression of demyelination. Variable enhancement compatible  with acute to subacute lesions in a background of chronic supra- and infratentorial demyelination. No parenchymal brain  volume loss for age. MRI CERVICAL SPINE:  Limited motion degraded examination. Multiple spinal cord plaques compatible with demyelination, relatively stable though limited assessment due to motion. No abnormal enhancement. No neurocompression or acute osseous process. Electronically Signed   By: Awilda Metro M.D.   On: 06/11/2016 02:31    Procedures Procedures (including critical care time)  Medications Ordered in ED Medications  methylPREDNISolone sodium succinate (SOLU-MEDROL) 1,000 mg in sodium chloride 0.9 % 50 mL IVPB (1,000 mg Intravenous Given 06/11/16 0218)  gadobenate dimeglumine (MULTIHANCE) injection 20 mL (17 mLs Intravenous Contrast Given 06/11/16 0204)     Initial Impression / Assessment and Plan / ED Course  I have reviewed the triage vital signs and the nursing notes.  Pertinent labs & imaging results that were available during my care of the patient were reviewed by me and considered in my medical decision making (see chart for details).       Patient presents to the ED for dizziness and weakness.  Her history is concerning for demyelinating disease and her prior MRI reveals this as well.  I spoke with neurology who recs to repeat this MRI in lieu of her prior one being almost 1 year ago.  She was given IV solumedrol in the ED.  Will admit if MRI is concerning for further IV steroids x 5 days treatment.  2:55 AM MRI shows new demylenating lesions.  Will call hospitalist for admission   I personally performed the services described in this documentation, which was scribed in my presence. The recorded information has been reviewed and is accurate.     Final Clinical Impressions(s) / ED Diagnoses   Final diagnoses:  Multiple sclerosis (HCC)    New Prescriptions New Prescriptions   No medications on file     Tomasita Crumble, MD 06/11/16 [phone]

## 2016-06-11 ENCOUNTER — Emergency Department (HOSPITAL_COMMUNITY): Payer: Self-pay

## 2016-06-11 DIAGNOSIS — Z5989 Other problems related to housing and economic circumstances: Secondary | ICD-10-CM

## 2016-06-11 DIAGNOSIS — G35 Multiple sclerosis: Secondary | ICD-10-CM | POA: Diagnosis present

## 2016-06-11 DIAGNOSIS — Z598 Other problems related to housing and economic circumstances: Secondary | ICD-10-CM

## 2016-06-11 LAB — CBC
HCT: 39 % (ref 36.0–46.0)
Hemoglobin: 12.8 g/dL (ref 12.0–15.0)
MCH: 26.9 pg (ref 26.0–34.0)
MCHC: 32.8 g/dL (ref 30.0–36.0)
MCV: 82.1 fL (ref 78.0–100.0)
Platelets: 305 10*3/uL (ref 150–400)
RBC: 4.75 MIL/uL (ref 3.87–5.11)
RDW: 12.7 % (ref 11.5–15.5)
WBC: 7.2 10*3/uL (ref 4.0–10.5)

## 2016-06-11 LAB — CBG MONITORING, ED: Glucose-Capillary: 182 mg/dL — ABNORMAL HIGH (ref 65–99)

## 2016-06-11 MED ORDER — ONDANSETRON HCL 4 MG/2ML IJ SOLN
4.0000 mg | Freq: Four times a day (QID) | INTRAMUSCULAR | Status: DC | PRN
Start: 2016-06-11 — End: 2016-06-14

## 2016-06-11 MED ORDER — SODIUM CHLORIDE 0.9 % IV SOLN
1000.0000 mg | Freq: Once | INTRAVENOUS | Status: AC
  Administered 2016-06-11: 1000 mg via INTRAVENOUS
  Filled 2016-06-11: qty 8

## 2016-06-11 MED ORDER — INSULIN ASPART 100 UNIT/ML ~~LOC~~ SOLN: 0.0000 [IU] | Freq: Three times a day (TID) | SUBCUTANEOUS | Status: DC

## 2016-06-11 MED ORDER — PANTOPRAZOLE SODIUM 40 MG PO TBEC
40.0000 mg | DELAYED_RELEASE_TABLET | Freq: Every day | ORAL | Status: DC
  Administered 2016-06-11 – 2016-06-14 (×4): 40 mg via ORAL
  Filled 2016-06-11 (×4): qty 1

## 2016-06-11 MED ORDER — ONDANSETRON HCL 4 MG PO TABS: 4.0000 mg | ORAL_TABLET | Freq: Four times a day (QID) | ORAL | Status: DC | PRN

## 2016-06-11 MED ORDER — SODIUM CHLORIDE 0.9 % IV SOLN
1000.0000 mg | INTRAVENOUS | Status: DC
  Administered 2016-06-11: 1000 mg via INTRAVENOUS
  Filled 2016-06-11 (×3): qty 8

## 2016-06-11 MED ORDER — ALBUTEROL SULFATE (2.5 MG/3ML) 0.083% IN NEBU: 2.5000 mg | INHALATION_SOLUTION | RESPIRATORY_TRACT | Status: DC | PRN

## 2016-06-11 MED ORDER — GADOBENATE DIMEGLUMINE 529 MG/ML IV SOLN
20.0000 mL | Freq: Once | INTRAVENOUS | Status: AC | PRN
  Administered 2016-06-11: 17 mL via INTRAVENOUS

## 2016-06-11 MED ORDER — ACETAMINOPHEN 650 MG RE SUPP: 650.0000 mg | Freq: Four times a day (QID) | RECTAL | Status: DC | PRN

## 2016-06-11 MED ORDER — ENOXAPARIN SODIUM 40 MG/0.4ML ~~LOC~~ SOLN
40.0000 mg | SUBCUTANEOUS | Status: DC
  Filled 2016-06-11 (×2): qty 0.4

## 2016-06-11 MED ORDER — ACETAMINOPHEN 325 MG PO TABS: 650.0000 mg | ORAL_TABLET | Freq: Four times a day (QID) | ORAL | Status: DC | PRN

## 2016-06-11 NOTE — ED Notes (Signed)
Patient transported to MRI. Medication has not arrived from pharmacy.

## 2016-06-11 NOTE — Consult Note (Signed)
NEURO HOSPITALIST CONSULT NOTE   Requestig physician: Dr. Ronalee Belts Reason for Consult: MS flair  History obtained from:  Patient     HPI:                                                                                                                                          Kelly Benson is an 21 y.o. female who was recently diagnosed with MS back in April 2020 fifth 2017. At that time MRI of brain showed a constellation of findings most compatible demyelinating disease, favoring MS. To enhancing white matter lesions in the left hemisphere compatible with acute demyelination. At that time patient received Solu-Medrol and was sent home to follow up with neurologist. Patient did not follow up with neurologist at that time secondary to financial constraints. Patient states that the symptoms at that time included slurred speech, vertigo, sensation of feeling off balance. [place] Over the past 5 days patient has been feeling the same symptoms with the exception of dysarthria. She states that if somebody passes her on her periphery she feels very dizzy along with feeling generalized fatigue. She denies any vertigo, numbness, tingling, unilateral weakness or pain.  Patient has been in the ED for quite some time and is already received a MRI of her cervical and brain. MRI shows 3 new supratentorial white matter lesions compatible with progressive demyelination in addition it shows multiple spinal cord plaques compatible with demyelination with no abnormal enhancement.  Past Medical History:  Diagnosis Date  . MS (multiple sclerosis) (HCC)   . Scoliosis     History reviewed. No pertinent surgical history.  Family History  Problem Relation Age of Onset  . Hypertension Father      Social History:  reports that she has quit smoking. Her smoking use included Cigarettes. She has never used smokeless tobacco. She reports that she does not  drink alcohol or use drugs.  No Known Allergies  MEDICATIONS:                                                                                                                     Current Facility-Administered Medications  Medication Dose Route Frequency Provider Last Rate Last Dose  . acetaminophen (TYLENOL) tablet 650  mg  650 mg Oral Q6H PRN Clydie Braun, MD       Or  . acetaminophen (TYLENOL) suppository 650 mg  650 mg Rectal Q6H PRN Rondell A Katrinka Blazing, MD      . albuterol (PROVENTIL) (2.5 MG/3ML) 0.083% nebulizer solution 2.5 mg  2.5 mg Nebulization Q2H PRN Rondell A Katrinka Blazing, MD      . enoxaparin (LOVENOX) injection 40 mg  40 mg Subcutaneous Q24H Rondell A Smith, MD      . methylPREDNISolone sodium succinate (SOLU-MEDROL) 1,000 mg in sodium chloride 0.9 % 50 mL IVPB  1,000 mg Intravenous Q24H Rondell A Katrinka Blazing, MD      . ondansetron (ZOFRAN) tablet 4 mg  4 mg Oral Q6H PRN Clydie Braun, MD       Or  . ondansetron (ZOFRAN) injection 4 mg  4 mg Intravenous Q6H PRN Rondell A Katrinka Blazing, MD      . pantoprazole (PROTONIX) EC tablet 40 mg  40 mg Oral Daily Clydie Braun, MD   40 mg at 06/11/16 0441   No current outpatient prescriptions on file.      ROS:                                                                                                                                       History obtained from the patient  General ROS: Positive for -  fatigue, Psychological ROS: negative for - behavioral disorder, hallucinations, memory difficulties, mood swings or suicidal ideation Ophthalmic ROS: negative for - blurry vision, double vision, eye pain or loss of vision ENT ROS: negative for - epistaxis, nasal discharge, oral lesions, sore throat, tinnitus or vertigo Allergy and Immunology ROS: negative for - hives or itchy/watery eyes Hematological and Lymphatic ROS: negative for - bleeding problems, bruising or swollen lymph nodes Endocrine ROS: negative for - galactorrhea, hair pattern changes,  polydipsia/polyuria or temperature intolerance Respiratory ROS: negative for - cough, hemoptysis, shortness of breath or wheezing Cardiovascular ROS: negative for - chest pain, dyspnea on exertion, edema or irregular heartbeat Gastrointestinal ROS: negative for - abdominal pain, diarrhea, hematemesis, nausea/vomiting or stool incontinence Genito-Urinary ROS: negative for - dysuria, hematuria, incontinence or urinary frequency/urgency Musculoskeletal ROS: negative for - joint swelling or muscular weakness Neurological ROS: as noted in HPI Dermatological ROS: negative for rash and skin lesion changes   Blood pressure 126/66, pulse 110, temperature 98.4 F (36.9 C), temperature source Oral, resp. rate 18, height 5\' 4"  (1.626 m), weight 79.4 kg (175 lb), last menstrual period 05/26/2016, SpO2 100 %.   Neurologic Examination:  HEENT-  Normocephalic, no lesions, without obvious abnormality.  Normal external eye and conjunctiva.  Normal TM's bilaterally.  Normal auditory canals and external ears. Normal external nose, mucus membranes and septum.  Normal pharynx. Cardiovascular- S1, S2 normal, pulses palpable throughout   Lungs- chest clear, no wheezing, rales, normal symmetric air entry, Heart exam - S1, S2 normal, no murmur, no gallop, rate regular Abdomen- normal findings: bowel sounds normal Extremities- no edema Lymph-no adenopathy palpable Musculoskeletal-no joint tenderness, deformity or swelling Skin-warm and dry, no hyperpigmentation, vitiligo, or suspicious lesions  Neurological Examination Mental Status: Alert, oriented, thought content appropriate.  Speech fluent without evidence of aphasia.  Able to follow 3 step commands without difficulty. Cranial Nerves: II: Discs flat bilaterally; Visual fields grossly normal, pupils equal, round, reactive to light and accommodation III,IV, VI:  ptosis not present, extra-ocular motions intact bilaterally V,VII: smile symmetric, facial light touch sensation normal bilaterally VIII: hearing normal bilaterally IX,X: uvula rises symmetrically XI: bilateral shoulder shrug XII: midline tongue extension Motor: Right : Upper extremity   5/5    Left:     Upper extremity   5/5  Lower extremity   5/5     Lower extremity   5/5 Tone and bulk:normal tone throughout; no atrophy noted Sensory: Pinprick and light touch intact throughout, bilaterally Deep Tendon Reflexes: 2+ and symmetric throughout Plantars: Right: downgoing   Left: downgoing Cerebellar: normal finger-to-nose,  and normal heel-to-shin test Gait: Not tested      Lab Results: Basic Metabolic Panel:  Recent Labs Lab 06/10/16 1618  NA 140  K 3.6  CL 103  CO2 26  GLUCOSE 88  BUN 11  CREATININE 0.60  CALCIUM 9.3    Liver Function Tests: No results for input(s): AST, ALT, ALKPHOS, BILITOT, PROT, ALBUMIN in the last 168 hours. No results for input(s): LIPASE, AMYLASE in the last 168 hours. No results for input(s): AMMONIA in the last 168 hours.  CBC:  Recent Labs Lab 06/10/16 1618 06/11/16 0529  WBC 6.0 7.2  HGB 13.0 12.8  HCT 40.2 39.0  MCV 82.4 82.1  PLT 310 305    Cardiac Enzymes: No results for input(s): CKTOTAL, CKMB, CKMBINDEX, TROPONINI in the last 168 hours.  Lipid Panel: No results for input(s): CHOL, TRIG, HDL, CHOLHDL, VLDL, LDLCALC in the last 168 hours.  CBG:  Recent Labs Lab 06/11/16 0757  GLUCAP 182*    Microbiology: Results for orders placed or performed in visit on 03/22/16  Urine culture     Status: None   Collection Time: 03/22/16 11:05 AM  Result Value Ref Range Status   Organism ID, Bacteria NO GROWTH  Final    Comment: Urine specimens that have urine cultures ordered, or might be reflexed to urine culture, should be submitted in Urine C&S transport gray top tubes. Supply order number is: [id number]. Urines that are <5  mL total volume may be submitted in a sterile, leak proof container for culture. Urines that are >5 mL volume (routine urines) that are not submitted in Urine C&S transport gray top tubes are not acceptable for culture and may be rejected If you have any questions, please contact your [facility] Account Representative directly, or call our Customer Service Department at [phone]     Coagulation Studies: No results for input(s): LABPROT, INR in the last 72 hours.  Imaging: Mr Laqueta Jean And Wo Contrast  Result Date: 06/11/2016 CLINICAL DATA:  Intermittent dizziness for 5 days. History of multiple sclerosis. EXAM: MRI HEAD WITHOUT AND WITH  CONTRAST MRI CERVICAL SPINE WITHOUT AND WITH CONTRAST TECHNIQUE: Multiplanar, multiecho pulse sequences of the brain and surrounding structures, and cervical spine, to include the craniocervical junction and cervicothoracic junction, were obtained without and with intravenous contrast. CONTRAST:  17mL MULTIHANCE GADOBENATE DIMEGLUMINE 529 MG/ML IV SOLN COMPARISON:  MRI head and cervical spine September 03, 2015 FINDINGS: MRI HEAD FINDINGS INTRACRANIAL CONTENTS: No reduced diffusion to suggest acute ischemia or hyperacute demyelination. T2 shine through associated with multiple white matter lesions. Stable subcentimeter posterior medullary lesion, less conspicuous RIGHT cerebellar lesion. Greater than expected FLAIR hyperintense signal within the periaqueductal gray, similar. At least 3 new supratentorial white matter lesions in a periventricular distribution. Greater than 10 supratentorial white matter lesions including bilateral temporal lobes, many of which radiate from the periventricular margin. Subcentimeter LEFT temporal and LEFT lesions demonstrate low T1 signal compatible with black holes of demyelination. Frontal cortical to juxta cortical lesions. Varying faint enhancement of a few supratentorial lesions. Less conspicuous RIGHT and similar LEFT thalamus  lesions. Ventricles and sulci normal for patient's age. No midline shift, mass effect or masses. No abnormal extra-axial fluid collections, enhancement or masses. VASCULAR: Normal major intracranial vascular flow voids present at skull base. SKULL AND UPPER CERVICAL SPINE: No abnormal sellar expansion. No suspicious calvarial bone marrow signal. Craniocervical junction maintained. SINUSES/ORBITS: The mastoid air-cells and included paranasal sinuses are well-aerated.The included ocular globes and orbital contents are non-suspicious. Optic neuritis could not excluded on the basis of old brain MRI. OTHER: None. MRI CERVICAL SPINE FINDINGS- moderately motion degraded axial post gadolinium, additional move mildly motion degraded axial pre contrast sequences. ALIGNMENT: Straightened cervical lordosis.  No malalignment. VERTEBRAE/DISCS: Vertebral bodies are intact. 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 Intervertebral disc morphology's and signal are normal. No abnormal bone marrow signal. No abnormal osseous or intradiscal enhancement. CORD:Due to mild patient motion, limited assessment for cord signal abnormality on the axial sequences. However, patchy bright STIR signal within the cervical spinal cord at C2-3 with mild expansion. To lesser extent patchy non expansile bright STIR signal at C4, C5 and C7-T1. No abnormal enhancement. POSTERIOR FOSSA, VERTEBRAL ARTERIES, PARASPINAL TISSUES: No MR findings of ligamentous injury. Vertebral artery flow voids present. Included posterior fossa and paraspinal soft tissues are normal. DISC LEVELS: C2-3 through C7-T1: No disc bulge, canal stenosis nor neural foraminal narrowing. IMPRESSION: MRI HEAD: At least 3 new supratentorial white matter lesions compatible with progression of demyelination. Variable enhancement compatible with acute to subacute lesions in a background of chronic supra- and infratentorial demyelination. No parenchymal brain volume loss for age. MRI CERVICAL SPINE:  Limited  motion degraded examination. Multiple spinal cord plaques compatible with demyelination, relatively stable though limited assessment due to motion. No abnormal enhancement. No neurocompression or acute osseous process. Electronically Signed   By: Awilda Metro M.D.   On: 06/11/2016 02:31   Mr Cervical Spine W Or Wo Contrast  Result Date: 06/11/2016 CLINICAL DATA:  Intermittent dizziness for 5 days. History of multiple sclerosis. EXAM: MRI HEAD WITHOUT AND WITH CONTRAST MRI CERVICAL SPINE WITHOUT AND WITH CONTRAST TECHNIQUE: Multiplanar, multiecho pulse sequences of the brain and surrounding structures, and cervical spine, to include the craniocervical junction and cervicothoracic junction, were obtained without and with intravenous contrast. CONTRAST:  17mL MULTIHANCE GADOBENATE DIMEGLUMINE 529 MG/ML IV SOLN COMPARISON:  MRI head and cervical spine September 03, 2015 FINDINGS: MRI HEAD FINDINGS INTRACRANIAL CONTENTS: No reduced diffusion to suggest acute ischemia or hyperacute demyelination. T2 shine through associated with multiple white matter lesions. Stable subcentimeter posterior medullary lesion,  less conspicuous RIGHT cerebellar lesion. Greater than expected FLAIR hyperintense signal within the periaqueductal gray, similar. At least 3 new supratentorial white matter lesions in a periventricular distribution. Greater than 10 supratentorial white matter lesions including bilateral temporal lobes, many of which radiate from the periventricular margin. Subcentimeter LEFT temporal and LEFT lesions demonstrate low T1 signal compatible with black holes of demyelination. Frontal cortical to juxta cortical lesions. Varying faint enhancement of a few supratentorial lesions. Less conspicuous RIGHT and similar LEFT thalamus lesions. Ventricles and sulci normal for patient's age. No midline shift, mass effect or masses. No abnormal extra-axial fluid collections, enhancement or masses. VASCULAR: Normal major  intracranial vascular flow voids present at skull base. SKULL AND UPPER CERVICAL SPINE: No abnormal sellar expansion. No suspicious calvarial bone marrow signal. Craniocervical junction maintained. SINUSES/ORBITS: The mastoid air-cells and included paranasal sinuses are well-aerated.The included ocular globes and orbital contents are non-suspicious. Optic neuritis could not excluded on the basis of old brain MRI. OTHER: None. MRI CERVICAL SPINE FINDINGS- moderately motion degraded axial post gadolinium, additional move mildly motion degraded axial pre contrast sequences. ALIGNMENT: Straightened cervical lordosis.  No malalignment. VERTEBRAE/DISCS: Vertebral bodies are intact. 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 Intervertebral disc morphology's and signal are normal. No abnormal bone marrow signal. No abnormal osseous or intradiscal enhancement. CORD:Due to mild patient motion, limited assessment for cord signal abnormality on the axial sequences. However, patchy bright STIR signal within the cervical spinal cord at C2-3 with mild expansion. To lesser extent patchy non expansile bright STIR signal at C4, C5 and C7-T1. No abnormal enhancement. POSTERIOR FOSSA, VERTEBRAL ARTERIES, PARASPINAL TISSUES: No MR findings of ligamentous injury. Vertebral artery flow voids present. Included posterior fossa and paraspinal soft tissues are normal. DISC LEVELS: C2-3 through C7-T1: No disc bulge, canal stenosis nor neural foraminal narrowing. IMPRESSION: MRI HEAD: At least 3 new supratentorial white matter lesions compatible with progression of demyelination. Variable enhancement compatible with acute to subacute lesions in a background of chronic supra- and infratentorial demyelination. No parenchymal brain volume loss for age. MRI CERVICAL SPINE:  Limited motion degraded examination. Multiple spinal cord plaques compatible with demyelination, relatively stable though limited assessment due to motion. No abnormal enhancement. No  neurocompression or acute osseous process. Electronically Signed   By: Awilda Metro M.D.   On: 06/11/2016 02:31       Assessment and plan per attending neurologist    Assessment/Plan:  21 year old female with known history of multiple sclerosis. And now presenting with similar symptoms with prior flareups. MRI brain and cervical spine confirm new acute lesions.  Recommend: 1) IV Protonix 40 mg daily forgot protection 2) Solu-Medrol 500 mg twice a day for a total of 10 doses 3) PT/OT 4) patient will need an outpatient follow-up with neurology at discharge.  Dr. Amada Jupiter to addend the note   Felicie Morn PA-C [facility] [phone]  06/11/2016, 11:39 AM  I have seen the patient and agree with the above note. On my exam she did endorse some hypoesthesia on the right leg.  21 year old female with no multiple sclerosis who presents with dizziness and found to have new acute enhancing lesions. She is not currently taking any prophylactic therapy. She'll need to be admitted for IV Solu-Medrol in physical therapy. The larger picture is that she will need long-term follow-up with neurology and work on patient assistance programs for preventative therapy given that all of them are exceedingly expensive.  Ritta Slot, MD [facility] [phone]  If 7pm- 7am, please page neurology on call  as listed in [facility]ION.

## 2016-06-11 NOTE — Care Management Note (Signed)
Case Management Note  Patient Details  Name: Kelly Benson MRN: [id number] Date of Birth: [birth year]  Subjective/Objective:                  From home with spouse. /20 y.o. female with medical history significant of multiple sclerosis diagnosed 08/2015; who presents with complaints of dizziness for the last 5 days.  Action/Plan: Admit status INPATIENT (Multiple sclerosis flare: Acute); anticipate discharge HOME WITH SELF CARE.   Expected Discharge Date:   (unsure)               Expected Discharge Plan:  Home/Self Care  In-House Referral:  Financial Counselor  Discharge planning Services  CM Consult  Post Acute Care Choice:    Choice offered to:     DME Arranged:    DME Agency:     HH Arranged:    HH Agency:     Status of Service:  In process, will continue to follow  If discussed at Long Length of Stay Meetings, dates discussed:    Additional Comments: Pt is active with [facility] with Concepcion Living, NP as PCP.  Her last visit was 03/22/16. Oletta Cohn, RN 06/11/2016, 8:45 AM

## 2016-06-11 NOTE — Progress Notes (Signed)
PROGRESS NOTE    Kelly Benson  ZOX:[id number] DOB: April 28, 1996 DOA: 06/10/2016 PCP: Concepcion Living, NP   Brief Narrative: 21 y.o. female with medical history significant of multiple sclerosis diagnosed 08/2015; who presents with complaints of dizziness for the last 5 days. She did not follow-up with neurology at that time due to lack of a job and insurance. Patient still reports being without insurance. MRI of the brain and cervcial spine revealed at least 3 new supratentorial white matter lesions progression of demyelinization. Neurology was consulted and recommended of the steroids of 1 g IV daily.   Assessment & Plan:   #  Multiple sclerosis exacerbation (HCC): -Discussed with the neurologist today for the consult. -Continue IV Solu-Medrol, Protonix -PT OT therapy -Further management and evaluation as per neurologist -We'll check blood sugar level while on high-dose steroid.  # Does not have health insurance: Case manager referral  DVT prophylaxis: Lovenox subcutaneous Code Status: Full code Family Communication: No family present at bedside Disposition Plan: Likely discharge home in 1-2 days depending the neurologist plan    Consultants:   Neurology  Procedures: MRI brain Antimicrobials: None  Subjective: Patient was seen and examined at bedside in the ER. Reported feeling better. Denied headache, dizziness, chest pain or shortness of breath.   Objective: Vitals:   06/11/16 0745 06/11/16 0752 06/11/16 0932 06/11/16 1311  BP: 111/60 111/60 126/66 124/65  Pulse: 88 94 110 99  Resp:  16 18 18   Temp:   98.4 F (36.9 C)   TempSrc:   Oral   SpO2: 98% 99% 100% 97%  Weight:      Height:       No intake or output data in the 24 hours ending 06/11/16 1600 Filed Weights   06/10/16 1554  Weight: 79.4 kg (175 lb)    Examination:  General exam: Appears calm and comfortable  Respiratory system: Clear to auscultation. Respiratory effort normal. No wheezing or  crackle Cardiovascular system: S1 & S2 heard, RRR.  No pedal edema. Gastrointestinal system: Abdomen is nondistended, soft and nontender. Normal bowel sounds heard. Central nervous system: Alert and oriented. No focal neurological deficits. Extremities: Symmetric 5 x 5 power. Skin: No rashes, lesions or ulcers Psychiatry: Judgement and insight appear normal. Mood & affect appropriate.     Data Reviewed: I have personally reviewed following labs and imaging studies  CBC:  Recent Labs Lab 06/10/16 1618 06/11/16 0529  WBC 6.0 7.2  HGB 13.0 12.8  HCT 40.2 39.0  MCV 82.4 82.1  PLT 310 305   Basic Metabolic Panel:  Recent Labs Lab 06/10/16 1618  NA 140  K 3.6  CL 103  CO2 26  GLUCOSE 88  BUN 11  CREATININE 0.60  CALCIUM 9.3   GFR: Estimated Creatinine Clearance: 112.5 mL/min (by C-G formula based on SCr of 0.6 mg/dL). Liver Function Tests: No results for input(s): AST, ALT, ALKPHOS, BILITOT, PROT, ALBUMIN in the last 168 hours. No results for input(s): LIPASE, AMYLASE in the last 168 hours. No results for input(s): AMMONIA in the last 168 hours. Coagulation Profile: No results for input(s): INR, PROTIME in the last 168 hours. Cardiac Enzymes: No results for input(s): CKTOTAL, CKMB, CKMBINDEX, TROPONINI in the last 168 hours. BNP (last 3 results) No results for input(s): PROBNP in the last 8760 hours. HbA1C: No results for input(s): HGBA1C in the last 72 hours. CBG:  Recent Labs Lab 06/11/16 0757  GLUCAP 182*   Lipid Profile: No results for input(s): CHOL, HDL, LDLCALC,  TRIG, CHOLHDL, LDLDIRECT in the last 72 hours. Thyroid Function Tests: No results for input(s): TSH, T4TOTAL, FREET4, T3FREE, THYROIDAB in the last 72 hours. Anemia Panel: No results for input(s): VITAMINB12, FOLATE, FERRITIN, TIBC, IRON, RETICCTPCT in the last 72 hours. Sepsis Labs: No results for input(s): PROCALCITON, LATICACIDVEN in the last 168 hours.  No results found for this or any  previous visit (from the past 240 hour(s)).       Radiology Studies: Mr Laqueta Jean And Wo Contrast  Result Date: 06/11/2016 CLINICAL DATA:  Intermittent dizziness for 5 days. History of multiple sclerosis. EXAM: MRI HEAD WITHOUT AND WITH CONTRAST MRI CERVICAL SPINE WITHOUT AND WITH CONTRAST TECHNIQUE: Multiplanar, multiecho pulse sequences of the brain and surrounding structures, and cervical spine, to include the craniocervical junction and cervicothoracic junction, were obtained without and with intravenous contrast. CONTRAST:  17mL MULTIHANCE GADOBENATE DIMEGLUMINE 529 MG/ML IV SOLN COMPARISON:  MRI head and cervical spine September 03, 2015 FINDINGS: MRI HEAD FINDINGS INTRACRANIAL CONTENTS: No reduced diffusion to suggest acute ischemia or hyperacute demyelination. T2 shine through associated with multiple white matter lesions. Stable subcentimeter posterior medullary lesion, less conspicuous RIGHT cerebellar lesion. Greater than expected FLAIR hyperintense signal within the periaqueductal gray, similar. At least 3 new supratentorial white matter lesions in a periventricular distribution. Greater than 10 supratentorial white matter lesions including bilateral temporal lobes, many of which radiate from the periventricular margin. Subcentimeter LEFT temporal and LEFT lesions demonstrate low T1 signal compatible with black holes of demyelination. Frontal cortical to juxta cortical lesions. Varying faint enhancement of a few supratentorial lesions. Less conspicuous RIGHT and similar LEFT thalamus lesions. Ventricles and sulci normal for patient's age. No midline shift, mass effect or masses. No abnormal extra-axial fluid collections, enhancement or masses. VASCULAR: Normal major intracranial vascular flow voids present at skull base. SKULL AND UPPER CERVICAL SPINE: No abnormal sellar expansion. No suspicious calvarial bone marrow signal. Craniocervical junction maintained. SINUSES/ORBITS: The mastoid air-cells and  included paranasal sinuses are well-aerated.The included ocular globes and orbital contents are non-suspicious. Optic neuritis could not excluded on the basis of old brain MRI. OTHER: None. MRI CERVICAL SPINE FINDINGS- moderately motion degraded axial post gadolinium, additional move mildly motion degraded axial pre contrast sequences. ALIGNMENT: Straightened cervical lordosis.  No malalignment. VERTEBRAE/DISCS: Vertebral bodies are intact. 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 Intervertebral disc morphology's and signal are normal. No abnormal bone marrow signal. No abnormal osseous or intradiscal enhancement. CORD:Due to mild patient motion, limited assessment for cord signal abnormality on the axial sequences. However, patchy bright STIR signal within the cervical spinal cord at C2-3 with mild expansion. To lesser extent patchy non expansile bright STIR signal at C4, C5 and C7-T1. No abnormal enhancement. POSTERIOR FOSSA, VERTEBRAL ARTERIES, PARASPINAL TISSUES: No MR findings of ligamentous injury. Vertebral artery flow voids present. Included posterior fossa and paraspinal soft tissues are normal. DISC LEVELS: C2-3 through C7-T1: No disc bulge, canal stenosis nor neural foraminal narrowing. IMPRESSION: MRI HEAD: At least 3 new supratentorial white matter lesions compatible with progression of demyelination. Variable enhancement compatible with acute to subacute lesions in a background of chronic supra- and infratentorial demyelination. No parenchymal brain volume loss for age. MRI CERVICAL SPINE:  Limited motion degraded examination. Multiple spinal cord plaques compatible with demyelination, relatively stable though limited assessment due to motion. No abnormal enhancement. No neurocompression or acute osseous process. Electronically Signed   By: Awilda Metro M.D.   On: 06/11/2016 02:31   Mr Cervical Spine W Or Wo Contrast  Result Date:  06/11/2016 CLINICAL DATA:  Intermittent dizziness for 5 days. History of  multiple sclerosis. EXAM: MRI HEAD WITHOUT AND WITH CONTRAST MRI CERVICAL SPINE WITHOUT AND WITH CONTRAST TECHNIQUE: Multiplanar, multiecho pulse sequences of the brain and surrounding structures, and cervical spine, to include the craniocervical junction and cervicothoracic junction, were obtained without and with intravenous contrast. CONTRAST:  67mL MULTIHANCE GADOBENATE DIMEGLUMINE 529 MG/ML IV SOLN COMPARISON:  MRI head and cervical spine September 03, 2015 FINDINGS: MRI HEAD FINDINGS INTRACRANIAL CONTENTS: No reduced diffusion to suggest acute ischemia or hyperacute demyelination. T2 shine through associated with multiple white matter lesions. Stable subcentimeter posterior medullary lesion, less conspicuous RIGHT cerebellar lesion. Greater than expected FLAIR hyperintense signal within the periaqueductal gray, similar. At least 3 new supratentorial white matter lesions in a periventricular distribution. Greater than 10 supratentorial white matter lesions including bilateral temporal lobes, many of which radiate from the periventricular margin. Subcentimeter LEFT temporal and LEFT lesions demonstrate low T1 signal compatible with black holes of demyelination. Frontal cortical to juxta cortical lesions. Varying faint enhancement of a few supratentorial lesions. Less conspicuous RIGHT and similar LEFT thalamus lesions. Ventricles and sulci normal for patient's age. No midline shift, mass effect or masses. No abnormal extra-axial fluid collections, enhancement or masses. VASCULAR: Normal major intracranial vascular flow voids present at skull base. SKULL AND UPPER CERVICAL SPINE: No abnormal sellar expansion. No suspicious calvarial bone marrow signal. Craniocervical junction maintained. SINUSES/ORBITS: The mastoid air-cells and included paranasal sinuses are well-aerated.The included ocular globes and orbital contents are non-suspicious. Optic neuritis could not excluded on the basis of old brain MRI. OTHER: None.  MRI CERVICAL SPINE FINDINGS- moderately motion degraded axial post gadolinium, additional move mildly motion degraded axial pre contrast sequences. ALIGNMENT: Straightened cervical lordosis.  No malalignment. VERTEBRAE/DISCS: Vertebral bodies are intact. 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 Intervertebral disc morphology's and signal are normal. No abnormal bone marrow signal. No abnormal osseous or intradiscal enhancement. CORD:Due to mild patient motion, limited assessment for cord signal abnormality on the axial sequences. However, patchy bright STIR signal within the cervical spinal cord at C2-3 with mild expansion. To lesser extent patchy non expansile bright STIR signal at C4, C5 and C7-T1. No abnormal enhancement. POSTERIOR FOSSA, VERTEBRAL ARTERIES, PARASPINAL TISSUES: No MR findings of ligamentous injury. Vertebral artery flow voids present. Included posterior fossa and paraspinal soft tissues are normal. DISC LEVELS: C2-3 through C7-T1: No disc bulge, canal stenosis nor neural foraminal narrowing. IMPRESSION: MRI HEAD: At least 3 new supratentorial white matter lesions compatible with progression of demyelination. Variable enhancement compatible with acute to subacute lesions in a background of chronic supra- and infratentorial demyelination. No parenchymal brain volume loss for age. MRI CERVICAL SPINE:  Limited motion degraded examination. Multiple spinal cord plaques compatible with demyelination, relatively stable though limited assessment due to motion. No abnormal enhancement. No neurocompression or acute osseous process. Electronically Signed   By: Awilda Metro M.D.   On: 06/11/2016 02:31        Scheduled Meds: . enoxaparin (LOVENOX) injection  40 mg Subcutaneous Q24H  . methylPREDNISolone (SOLU-MEDROL) injection  1,000 mg Intravenous Q24H  . pantoprazole  40 mg Oral Daily   Continuous Infusions:   LOS: 0 days    Dron Jaynie Collins, MD [facility] Pager [phone]  If  7PM-7AM, please contact night-coverage [email or web address] Password [email or web address] 06/11/2016, 4:00 PM

## 2016-06-11 NOTE — ED Notes (Signed)
Pt did not need anything at this time  

## 2016-06-11 NOTE — H&P (Signed)
History and Physical    Kelly Benson JXB:[id number] DOB: [birth year] DOA: 06/10/2016  Referring MD/NP/PA: Dr. Mora Bellman PCP: Concepcion Living, NP  Patient coming from: Home  Chief Complaint:  dizziness  HPI: Kelly Benson is a 21 y.o. female with medical history significant of multiple sclerosis diagnosed 08/2015; who presents with complaints of dizziness for the last 5 days. Patient reports feeling as though the room is spinning around her especially with standing. Associated symptoms include intermittent hot flashes, fatigue, numbness and tingling sensation of digits 4-5 of the right hand and foot. Denies any chest pain, falls, loss of consciousness, diarrhea, abdominal pain, nausea, vomiting, or dysuria. Patient notes having similar symptoms when she was first diagnosed with multiple sclerosis. She reports resolution of symptoms after being admitted into the hospital with IV steroids. However, she did not follow-up with neurology at that time due to lack of a job and insurance. Patient still reports being without insurance.  ED Course: Upon admission patient was seen have vital signs and lab work within normal limits. MRI of the brain and cervcial spine revealed at least 3 new supratentorial white matter lesions progression of demyelinization. Neurology was consulted and recommended of the steroids of 1 g IV daily.     Review of Systems: As per HPI otherwise 10 point review of systems negative.   Past Medical History:  Diagnosis Date  . MS (multiple sclerosis) (HCC)   . Scoliosis     History reviewed. No pertinent surgical history.   reports that she has quit smoking. Her smoking use included Cigarettes. She has never used smokeless tobacco. She reports that she does not drink alcohol or use drugs.  No Known Allergies  Family History  Problem Relation Age of Onset  . Hypertension Father     Prior to Admission medications   Not on File    Physical  Exam:    Constitutional: Young female NAD, calm, comfortable Vitals:   06/11/16 0216 06/11/16 0218 06/11/16 0230 06/11/16 0245  BP: 123/82  130/77 106/57  Pulse:  67 67 67  Resp:      Temp:      TempSrc:      SpO2:  100% 100% 100%  Weight:      Height:       Eyes: PERRL, lids and conjunctivae normal ENMT: Mucous membranes are moist. Posterior pharynx clear of any exudate or lesions.Normal dentition.  Neck: normal, supple, no masses, no thyromegaly Respiratory: clear to auscultation bilaterally, no wheezing, no crackles. Normal respiratory effort. No accessory muscle use.  Cardiovascular: Regular rate and rhythm, no murmurs / rubs / gallops. No extremity edema. 2+ pedal pulses. No carotid bruits.  Abdomen: no tenderness, no masses palpated. No hepatosplenomegaly. Bowel sounds positive.  Musculoskeletal: no clubbing / cyanosis. No joint deformity upper and lower extremities. Good ROM, no contractures. Normal muscle tone.  Skin: no rashes, lesions, ulcers. No induration Neurologic: CN 2-12 grossly intact. Sensation intact, DTR normal. Strength 5/5 in all 4.  Psychiatric: Normal judgment and insight. Alert and oriented x 3. Normal mood.     Labs on Admission: I have personally reviewed following labs and imaging studies  CBC:  Recent Labs Lab 06/10/16 1618  WBC 6.0  HGB 13.0  HCT 40.2  MCV 82.4  PLT 310   Basic Metabolic Panel:  Recent Labs Lab 06/10/16 1618  NA 140  K 3.6  CL 103  CO2 26  GLUCOSE 88  BUN 11  CREATININE 0.60  CALCIUM 9.3  GFR: Estimated Creatinine Clearance: 112.5 mL/min (by C-G formula based on SCr of 0.6 mg/dL). Liver Function Tests: No results for input(s): AST, ALT, ALKPHOS, BILITOT, PROT, ALBUMIN in the last 168 hours. No results for input(s): LIPASE, AMYLASE in the last 168 hours. No results for input(s): AMMONIA in the last 168 hours. Coagulation Profile: No results for input(s): INR, PROTIME in the last 168 hours. Cardiac  Enzymes: No results for input(s): CKTOTAL, CKMB, CKMBINDEX, TROPONINI in the last 168 hours. BNP (last 3 results) No results for input(s): PROBNP in the last 8760 hours. HbA1C: No results for input(s): HGBA1C in the last 72 hours. CBG: No results for input(s): GLUCAP in the last 168 hours. Lipid Profile: No results for input(s): CHOL, HDL, LDLCALC, TRIG, CHOLHDL, LDLDIRECT in the last 72 hours. Thyroid Function Tests: No results for input(s): TSH, T4TOTAL, FREET4, T3FREE, THYROIDAB in the last 72 hours. Anemia Panel: No results for input(s): VITAMINB12, FOLATE, FERRITIN, TIBC, IRON, RETICCTPCT in the last 72 hours. Urine analysis:    Component Value Date/Time   COLORURINE STRAW (A) 06/10/2016 1711   APPEARANCEUR CLEAR 06/10/2016 1711   LABSPEC 1.014 06/10/2016 1711   PHURINE 7.0 06/10/2016 1711   GLUCOSEU 50 (A) 06/10/2016 1711   HGBUR NEGATIVE 06/10/2016 1711   BILIRUBINUR NEGATIVE 06/10/2016 1711   KETONESUR NEGATIVE 06/10/2016 1711   PROTEINUR NEGATIVE 06/10/2016 1711   UROBILINOGEN 0.2 01/30/2016 1556   NITRITE NEGATIVE 06/10/2016 1711   LEUKOCYTESUR NEGATIVE 06/10/2016 1711   Sepsis Labs: No results found for this or any previous visit (from the past 240 hour(s)).   Radiological Exams on Admission: Mr Laqueta Jean And Wo Contrast  Result Date: 06/11/2016 CLINICAL DATA:  Intermittent dizziness for 5 days. History of multiple sclerosis. EXAM: MRI HEAD WITHOUT AND WITH CONTRAST MRI CERVICAL SPINE WITHOUT AND WITH CONTRAST TECHNIQUE: Multiplanar, multiecho pulse sequences of the brain and surrounding structures, and cervical spine, to include the craniocervical junction and cervicothoracic junction, were obtained without and with intravenous contrast. CONTRAST:  17mL MULTIHANCE GADOBENATE DIMEGLUMINE 529 MG/ML IV SOLN COMPARISON:  MRI head and cervical spine September 03, 2015 FINDINGS: MRI HEAD FINDINGS INTRACRANIAL CONTENTS: No reduced diffusion to suggest acute ischemia or hyperacute  demyelination. T2 shine through associated with multiple white matter lesions. Stable subcentimeter posterior medullary lesion, less conspicuous RIGHT cerebellar lesion. Greater than expected FLAIR hyperintense signal within the periaqueductal gray, similar. At least 3 new supratentorial white matter lesions in a periventricular distribution. Greater than 10 supratentorial white matter lesions including bilateral temporal lobes, many of which radiate from the periventricular margin. Subcentimeter LEFT temporal and LEFT lesions demonstrate low T1 signal compatible with black holes of demyelination. Frontal cortical to juxta cortical lesions. Varying faint enhancement of a few supratentorial lesions. Less conspicuous RIGHT and similar LEFT thalamus lesions. Ventricles and sulci normal for patient's age. No midline shift, mass effect or masses. No abnormal extra-axial fluid collections, enhancement or masses. VASCULAR: Normal major intracranial vascular flow voids present at skull base. SKULL AND UPPER CERVICAL SPINE: No abnormal sellar expansion. No suspicious calvarial bone marrow signal. Craniocervical junction maintained. SINUSES/ORBITS: The mastoid air-cells and included paranasal sinuses are well-aerated.The included ocular globes and orbital contents are non-suspicious. Optic neuritis could not excluded on the basis of old brain MRI. OTHER: None. MRI CERVICAL SPINE FINDINGS- moderately motion degraded axial post gadolinium, additional move mildly motion degraded axial pre contrast sequences. ALIGNMENT: Straightened cervical lordosis.  No malalignment. VERTEBRAE/DISCS: Vertebral bodies are intact. 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 Intervertebral disc morphology's and signal are  normal. No abnormal bone marrow signal. No abnormal osseous or intradiscal enhancement. CORD:Due to mild patient motion, limited assessment for cord signal abnormality on the axial sequences. However, patchy bright STIR signal within the cervical  spinal cord at C2-3 with mild expansion. To lesser extent patchy non expansile bright STIR signal at C4, C5 and C7-T1. No abnormal enhancement. POSTERIOR FOSSA, VERTEBRAL ARTERIES, PARASPINAL TISSUES: No MR findings of ligamentous injury. Vertebral artery flow voids present. Included posterior fossa and paraspinal soft tissues are normal. DISC LEVELS: C2-3 through C7-T1: No disc bulge, canal stenosis nor neural foraminal narrowing. IMPRESSION: MRI HEAD: At least 3 new supratentorial white matter lesions compatible with progression of demyelination. Variable enhancement compatible with acute to subacute lesions in a background of chronic supra- and infratentorial demyelination. No parenchymal brain volume loss for age. MRI CERVICAL SPINE:  Limited motion degraded examination. Multiple spinal cord plaques compatible with demyelination, relatively stable though limited assessment due to motion. No abnormal enhancement. No neurocompression or acute osseous process. Electronically Signed   By: Awilda Metro M.D.   On: 06/11/2016 02:31   Mr Cervical Spine W Or Wo Contrast  Result Date: 06/11/2016 CLINICAL DATA:  Intermittent dizziness for 5 days. History of multiple sclerosis. EXAM: MRI HEAD WITHOUT AND WITH CONTRAST MRI CERVICAL SPINE WITHOUT AND WITH CONTRAST TECHNIQUE: Multiplanar, multiecho pulse sequences of the brain and surrounding structures, and cervical spine, to include the craniocervical junction and cervicothoracic junction, were obtained without and with intravenous contrast. CONTRAST:  29mL MULTIHANCE GADOBENATE DIMEGLUMINE 529 MG/ML IV SOLN COMPARISON:  MRI head and cervical spine September 03, 2015 FINDINGS: MRI HEAD FINDINGS INTRACRANIAL CONTENTS: No reduced diffusion to suggest acute ischemia or hyperacute demyelination. T2 shine through associated with multiple white matter lesions. Stable subcentimeter posterior medullary lesion, less conspicuous RIGHT cerebellar lesion. Greater than expected  FLAIR hyperintense signal within the periaqueductal gray, similar. At least 3 new supratentorial white matter lesions in a periventricular distribution. Greater than 10 supratentorial white matter lesions including bilateral temporal lobes, many of which radiate from the periventricular margin. Subcentimeter LEFT temporal and LEFT lesions demonstrate low T1 signal compatible with black holes of demyelination. Frontal cortical to juxta cortical lesions. Varying faint enhancement of a few supratentorial lesions. Less conspicuous RIGHT and similar LEFT thalamus lesions. Ventricles and sulci normal for patient's age. No midline shift, mass effect or masses. No abnormal extra-axial fluid collections, enhancement or masses. VASCULAR: Normal major intracranial vascular flow voids present at skull base. SKULL AND UPPER CERVICAL SPINE: No abnormal sellar expansion. No suspicious calvarial bone marrow signal. Craniocervical junction maintained. SINUSES/ORBITS: The mastoid air-cells and included paranasal sinuses are well-aerated.The included ocular globes and orbital contents are non-suspicious. Optic neuritis could not excluded on the basis of old brain MRI. OTHER: None. MRI CERVICAL SPINE FINDINGS- moderately motion degraded axial post gadolinium, additional move mildly motion degraded axial pre contrast sequences. ALIGNMENT: Straightened cervical lordosis.  No malalignment. VERTEBRAE/DISCS: Vertebral bodies are intact. 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 Intervertebral disc morphology's and signal are normal. No abnormal bone marrow signal. No abnormal osseous or intradiscal enhancement. CORD:Due to mild patient motion, limited assessment for cord signal abnormality on the axial sequences. However, patchy bright STIR signal within the cervical spinal cord at C2-3 with mild expansion. To lesser extent patchy non expansile bright STIR signal at C4, C5 and C7-T1. No abnormal enhancement. POSTERIOR FOSSA, VERTEBRAL ARTERIES, PARASPINAL  TISSUES: No MR findings of ligamentous injury. Vertebral artery flow voids present. Included posterior fossa and paraspinal soft tissues are normal. DISC  LEVELS: C2-3 through C7-T1: No disc bulge, canal stenosis nor neural foraminal narrowing. IMPRESSION: MRI HEAD: At least 3 new supratentorial white matter lesions compatible with progression of demyelination. Variable enhancement compatible with acute to subacute lesions in a background of chronic supra- and infratentorial demyelination. No parenchymal brain volume loss for age. MRI CERVICAL SPINE:  Limited motion degraded examination. Multiple spinal cord plaques compatible with demyelination, relatively stable though limited assessment due to motion. No abnormal enhancement. No neurocompression or acute osseous process. Electronically Signed   By: Awilda Metro M.D.   On: 06/11/2016 02:31      Assessment/Plan Multiple sclerosis flare: Acute. Patient presents with complaints of vertigo, paresthesias, hot flashes, and fatigue. MRI showing new supratentorial lesions. Given 1 g of Solu-Medrol IV in the ED. - Admit to MedSurg bed - Continue Solu-Medrol 1 g IV daily - Consult neurology in a.m.  Lack of insurance - Social work consult    DVT prophylaxis: lovenox   Code Status: Full  Family Communication: No family present at bedside  Disposition Plan: Likely discharge home once medically stable  Consults called:none Admission status: inpatient  Clydie Braun MD [facility] Pager [phone]  If 7PM-7AM, please contact night-coverage [email or web address] Password [email or web address]  06/11/2016, 3:17 AM

## 2016-06-12 LAB — GLUCOSE, CAPILLARY
Glucose-Capillary: 161 mg/dL — ABNORMAL HIGH (ref 65–99)
Glucose-Capillary: 179 mg/dL — ABNORMAL HIGH (ref 65–99)
Glucose-Capillary: 128 mg/dL — ABNORMAL HIGH (ref 65–99)

## 2016-06-12 MED ORDER — SODIUM CHLORIDE 0.9 % IV SOLN
500.0000 mg | Freq: Two times a day (BID) | INTRAVENOUS | Status: DC
  Administered 2016-06-12 – 2016-06-13 (×3): 500 mg via INTRAVENOUS
  Filled 2016-06-12 (×6): qty 4

## 2016-06-12 MED ORDER — SODIUM CHLORIDE 0.9 % IV SOLN: 500.0000 mg | Freq: Two times a day (BID) | INTRAVENOUS | Status: DC

## 2016-06-12 NOTE — Evaluation (Signed)
Physical Therapy Evaluation Patient Details Name: Kelly Benson MRN: [id number] DOB: December 05, 1995 Today's Date: 06/12/2016   History of Present Illness  21 y.o. female with medical history significant of multiple sclerosis diagnosed 08/2015; who presents with complaints of dizziness for the last 5 days.  MRI of the brain and cervcial spine revealed at least 3 new supratentorial white matter lesions progression of demyelinization.  Clinical Impression  Patient presents with decreased balance, some dizziness (reports funny feeling in head) and limited safety.  Feel she will benefit from skilled PT in the acute setting to allow d/c home with family support.  Likely not to get follow up PT as pt without insurance.  Will need to address stairs for safety with home entry prior to d/c home.     Follow Up Recommendations No PT follow up    Equipment Recommendations  Cane    Recommendations for Other Services       Precautions / Restrictions Precautions Precautions: Fall      Mobility  Bed Mobility Overal bed mobility: Modified Independent                Transfers Overall transfer level: Needs assistance   Transfers: Sit to/from Stand Sit to Stand: Supervision         General transfer comment: for safety  Ambulation/Gait Ambulation/Gait assistance: Min assist Ambulation Distance (Feet): 20 Feet Assistive device: 1 person hand held assist Gait Pattern/deviations: Step-through pattern;Decreased stride length     General Gait Details: slow pace, not willing to go out of room until has a shower, (spouse present and pt reports he will help her shower, RN aware)  Stairs            Wheelchair Mobility    Modified Rankin (Stroke Patients Only)       Balance Overall balance assessment: Needs assistance           Standing balance-Leahy Scale: Fair   Single Leg Stance - Right Leg: 3 Single Leg Stance - Left Leg: 1     Rhomberg - Eyes Opened: 60      High Level Balance Comments: able to stand eyes closed with S x 10 sec             Pertinent Vitals/Pain Pain Assessment: No/denies pain    Home Living Family/patient expects to be discharged to:: Private residence Living Arrangements: Spouse/significant other Available Help at Discharge: Family Type of Home: Apartment Home Access: Stairs to enter Entrance Stairs-Rails: Right Entrance Stairs-Number of Steps: 1 flight Home Layout: One level Home Equipment: None      Prior Function Level of Independence: Independent               Hand Dominance   Dominant Hand: Right    Extremity/Trunk Assessment   Upper Extremity Assessment Upper Extremity Assessment: Defer to OT evaluation    Lower Extremity Assessment Lower Extremity Assessment: Overall WFL for tasks assessed       Communication   Communication: No difficulties  Cognition Arousal/Alertness: Awake/alert Behavior During Therapy: Flat affect Overall Cognitive Status: Within Functional Limits for tasks assessed                      General Comments      Exercises     Assessment/Plan    PT Assessment Patient needs continued PT services  PT Problem List Decreased mobility;Decreased activity tolerance;Decreased balance;Decreased knowledge of use of DME  PT Treatment Interventions DME instruction;Therapeutic activities;Therapeutic exercise;Gait training;Patient/family education;Stair training;Balance training;Functional mobility training (vestibular rehab)    PT Goals (Current goals can be found in the Care Plan section)  Acute Rehab PT Goals Patient Stated Goal: to return to independent PT Goal Formulation: With patient Time For Goal Achievement: 06/19/16 Potential to Achieve Goals: Fair    Frequency Min 3X/week   Barriers to discharge        Co-evaluation               End of Session Equipment Utilized During Treatment: Gait belt Activity Tolerance: Patient  limited by fatigue Patient left: with call bell/phone within reach;with family/visitor present;in bed Nurse Communication: Other (comment) (plan to shower)         Time: 1350-1411 PT Time Calculation (min) (ACUTE ONLY): 21 min   Charges:   PT Evaluation $PT Eval Moderate Complexity: 1 Procedure     PT G CodesElray Mcgregor July 04, 2016, 3:15 PM  Sheran Lawless, PT [phone] 07-04-16

## 2016-06-12 NOTE — Progress Notes (Signed)
PROGRESS NOTE    Kelly Benson  XBM:[id number] DOB: 1996-03-08 DOA: 06/10/2016 PCP: Concepcion Living, NP   Brief Narrative: 21 y.o. female with medical history significant of multiple sclerosis diagnosed 08/2015; who presents with complaints of dizziness for the last 5 days. She did not follow-up with neurology at that time due to lack of a job and insurance. Patient still reports being without insurance. MRI of the brain and cervcial spine revealed at least 3 new supratentorial white matter lesions progression of demyelinization. Neurology was consulted and recommended  steroids of 1 g IV daily.   Assessment & Plan:   #  Multiple sclerosis exacerbation [facility]): -Neurology consult appreciated. Continue IV Solu-Medrol, Protonix. Further treatment and duration of steroid as per neurologist. PT, OT evaluation requested. Also discussed with the social worker regarding patient assistance for medications and follow-ups. -Continue to monitor blood sugar level. -Discontinue cardiac telemetry.  # Does not have health insurance: Case manager referral. Also discussed with the Child psychotherapist.  DVT prophylaxis: Lovenox subcutaneous Code Status: Full code Family Communication: No family present at bedside Disposition Plan: Likely discharge home in 1-2 days depending the neurologist plan    Consultants:   Neurology  Procedures: MRI brain Antimicrobials: None  Subjective: Patient was seen and examined at bedside. Has generalized weakness. Denied nausea vomiting headache, chest pain or shortness of breath.  ObPulse: 110 99 100 100  Resp: 18 18 20 18   Temp: 98.4 F (36.9 C)  98 F (36.7 C) 98.7 F (37.1 C)  TempSrc: Oral     SpO2: 100% 97% 98% 98%  Weight:   (!) 181.1 kg (399 lb 4.1 oz)   Height:   5\' 4"  (1.626 m)     Intake/Output Summary (Last 24 hours) at 06/12/16 1142 Last data filed at 06/12/16 0928  Gross per 24 hour  Intake              418 ml  Output                0 ml    Net              418 ml   Filed Weights   06/10/16 1554 06/11/16 1646  Weight: 79.4 kg (175 lb) (!) 181.1 kg (399 lb 4.1 oz)    Examination:  General exam: Not in distress, lying in bed comfortable. Respiratory system: There are bilateral, respiratory effort normal. Cardiovascular system: S1 & S2 heard, RRR.  No pedal edema. Gastrointestinal system: Abdomen is nondistended, soft and nontender. Normal bowel sounds heard. Central nervous system: PERLA, Alert and oriented. No focal neurological deficits. Extremities: Symmetric 5 x 5 power. Unchanged Skin: No rashes, lesions or ulcers Psychiatry: Judgement and insight appear normal. Mood & affect appropriate.     Data Reviewed: I have personally reviewed following labs and imaging studies  CBC:  Recent Labs Lab 06/10/16 1618 06/11/16 0529  WBC 6.0 7.2  HGB 13.0 12.8  HCT 40.2 39.0  MCV 82.4 82.1  PLT 310 305   Basic Metabolic Panel:  Recent Labs Lab 06/10/16 1618  NA 140  K 3.6  CL 103  CO2 26  GLUCOSE 88  BUN 11  CREATININE 0.60  CALCIUM 9.3   GFR: Estimated Creatinine Clearance: 183.4 mL/min (by C-G formula based on SCr of 0.6 mg/dL). Liver Function Tests: No results for input(s): AST, ALT, ALKPHOS, BILITOT, PROT, ALBUMIN in the last 168 hours. No results for input(s): LIPASE, AMYLASE in the last 168 hours. No results  for input(s): AMMONIA in the last 168 hours. Coagulation Profile: No results for input(s): INR, PROTIME in the last 168 hours. Cardiac Enzymes: No results for input(s): CKTOTAL, CKMB, CKMBINDEX, TROPONINI in the last 168 hours. BNP (last 3 results) No results for input(s): PROBNP in the last 8760 hours. HbA1C: No results for input(s): HGBA1C in the last 72 hours. CBG:  Recent Labs Lab 06/11/16 0757 06/12/16 0802  GLUCAP 182* 161*   Lipid Profile: No results for input(s): CHOL, HDL, LDLCALC, TRIG, CHOLHDL, LDLDIRECT in the last 72 hours. Thyroid Function Tests: No results for  input(s): TSH, T4TOTAL, FREET4, T3FREE, THYROIDAB in the last 72 hours. Anemia Panel: No results for input(s): VITAMINB12, FOLATE, FERRITIN, TIBC, IRON, RETICCTPCT in the last 72 hours. Sepsis Labs: No results for input(s): PROCALCITON, LATICACIDVEN in the last 168 hours.  No results found for this or any previous visit (from the past 240 hour(s)).       Radiology Studies: Mr Laqueta Jean And Wo Contrast  Result Date: 06/11/2016 CLINICAL DATA:  Intermittent dizziness for 5 days. History of multiple sclerosis. EXAM: MRI HEAD WITHOUT AND WITH CONTRAST MRI CERVICAL SPINE WITHOUT AND WITH CONTRAST TECHNIQUE: Multiplanar, multiecho pulse sequences of the brain and surrounding structures, and cervical spine, to include the craniocervical junction and cervicothoracic junction, were obtained without and with intravenous contrast. CONTRAST:  17mL MULTIHANCE GADOBENATE DIMEGLUMINE 529 MG/ML IV SOLN COMPARISON:  MRI head and cervical spine September 03, 2015 FINDINGS: MRI HEAD FINDINGS INTRACRANIAL CONTENTS: No reduced diffusion to suggest acute ischemia or hyperacute demyelination. T2 shine through associated with multiple white matter lesions. Stable subcentimeter posterior medullary lesion, less conspicuous RIGHT cerebellar lesion. Greater than expected FLAIR hyperintense signal within the periaqueductal gray, similar. At least 3 new supratentorial white matter lesions in a periventricular distribution. Greater than 10 supratentorial white matter lesions including bilateral temporal lobes, many of which radiate from the periventricular margin. Subcentimeter LEFT temporal and LEFT lesions demonstrate low T1 signal compatible with black holes of demyelination. Frontal cortical to juxta cortical lesions. Varying faint enhancement of a few supratentorial lesions. Less conspicuous RIGHT and similar LEFT thalamus lesions. Ventricles and sulci normal for patient's age. No midline shift, mass effect or masses. No abnormal  extra-axial fluid collections, enhancement or masses. VASCULAR: Normal major intracranial vascular flow voids present at skull base. SKULL AND UPPER CERVICAL SPINE: No abnormal sellar expansion. No suspicious calvarial bone marrow signal. Craniocervical junction maintained. SINUSES/ORBITS: The mastoid air-cells and included paranasal sinuses are well-aerated.The included ocular globes and orbital contents are non-suspicious. Optic neuritis could not excluded on the basis of old brain MRI. OTHER: None. MRI CERVICAL SPINE FINDINGS- moderately motion degraded axial post gadolinium, additional move mildly motion degraded axial pre contrast sequences. ALIGNMENT: Straightened cervical lordosis.  No malalignment. VERTEBRAE/DISCS: Vertebral bodies are intact. 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 Intervertebral disc morphology's and signal are normal. No abnormal bone marrow signal. No abnormal osseous or intradiscal enhancement. CORD:Due to mild patient motion, limited assessment for cord signal abnormality on the axial sequences. However, patchy bright STIR signal within the cervical spinal cord at C2-3 with mild expansion. To lesser extent patchy non expansile bright STIR signal at C4, C5 and C7-T1. No abnormal enhancement. POSTERIOR FOSSA, VERTEBRAL ARTERIES, PARASPINAL TISSUES: No MR findings of ligamentous injury. Vertebral artery flow voids present. Included posterior fossa and paraspinal soft tissues are normal. DISC LEVELS: C2-3 through C7-T1: No disc bulge, canal stenosis nor neural foraminal narrowing. IMPRESSION: MRI HEAD: At least 3 new supratentorial white matter lesions compatible with  progression of demyelination. Variable enhancement compatible with acute to subacute lesions in a background of chronic supra- and infratentorial demyelination. No parenchymal brain volume loss for age. MRI CERVICAL SPINE:  Limited motion degraded examination. Multiple spinal cord plaques compatible with demyelination, relatively stable  though limited assessment due to motion. No abnormal enhancement. No neurocompression or acute osseous process. Electronically Signed   By: Awilda Metro M.D.   On: 06/11/2016 02:31   Mr Cervical Spine W Or Wo Contrast  Result Date: 06/11/2016 CLINICAL DATA:  Intermittent dizziness for 5 days. History of multiple sclerosis. EXAM: MRI HEAD WITHOUT AND WITH CONTRAST MRI CERVICAL SPINE WITHOUT AND WITH CONTRAST TECHNIQUE: Multiplanar, multiecho pulse sequences of the brain and surrounding structures, and cervical spine, to include the craniocervical junction and cervicothoracic junction, were obtained without and with intravenous contrast. CONTRAST:  17mL MULTIHANCE GADOBENATE DIMEGLUMINE 529 MG/ML IV SOLN COMPARISON:  MRI head and cervical spine September 03, 2015 FINDINGS: MRI HEAD FINDINGS INTRACRANIAL CONTENTS: No reduced diffusion to suggest acute ischemia or hyperacute demyelination. T2 shine through associated with multiple white matter lesions. Stable subcentimeter posterior medullary lesion, less conspicuous RIGHT cerebellar lesion. Greater than expected FLAIR hyperintense signal within the periaqueductal gray, similar. At least 3 new supratentorial white matter lesions in a periventricular distribution. Greater than 10 supratentorial white matter lesions including bilateral temporal lobes, many of which radiate from the periventricular margin. Subcentimeter LEFT temporal and LEFT lesions demonstrate low T1 signal compatible with black holes of demyelination. Frontal cortical to juxta cortical lesions. Varying faint enhancement of a few supratentorial lesions. Less conspicuous RIGHT and similar LEFT thalamus lesions. Ventricles and sulci normal for patient's age. No midline shift, mass effect or masses. No abnormal extra-axial fluid collections, enhancement or masses. VASCULAR: Normal major intracranial vascular flow voids present at skull base. SKULL AND UPPER CERVICAL SPINE: No abnormal sellar expansion.  No suspicious calvarial bone marrow signal. Craniocervical junction maintained. SINUSES/ORBITS: The mastoid air-cells and included paranasal sinuses are well-aerated.The included ocular globes and orbital contents are non-suspicious. Optic neuritis could not excluded on the basis of old brain MRI. OTHER: None. MRI CERVICAL SPINE FINDINGS- moderately motion degraded axial post gadolinium, additional move mildly motion degraded axial pre contrast sequences. ALIGNMENT: Straightened cervical lordosis.  No malalignment. VERTEBRAE/DISCS: Vertebral bodies are intact. 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 Intervertebral disc morphology's and signal are normal. No abnormal bone marrow signal. No abnormal osseous or intradiscal enhancement. CORD:Due to mild patient motion, limited assessment for cord signal abnormality on the axial sequences. However, patchy bright STIR signal within the cervical spinal cord at C2-3 with mild expansion. To lesser extent patchy non expansile bright STIR signal at C4, C5 and C7-T1. No abnormal enhancement. POSTERIOR FOSSA, VERTEBRAL ARTERIES, PARASPINAL TISSUES: No MR findings of ligamentous injury. Vertebral artery flow voids present. Included posterior fossa and paraspinal soft tissues are normal. DISC LEVELS: C2-3 through C7-T1: No disc bulge, canal stenosis nor neural foraminal narrowing. IMPRESSION: MRI HEAD: At least 3 new supratentorial white matter lesions compatible with progression of demyelination. Variable enhancement compatible with acute to subacute lesions in a background of chronic supra- and infratentorial demyelination. No parenchymal brain volume loss for age. MRI CERVICAL SPINE:  Limited motion degraded examination. Multiple spinal cord plaques compatible with demyelination, relatively stable though limited assessment due to motion. No abnormal enhancement. No neurocompression or acute osseous process. Electronically Signed   By: Awilda Metro M.D.   On: 06/11/2016 02:31         Scheduled Meds: . enoxaparin (LOVENOX) injection  40 mg Subcutaneous Q24H  . methylPREDNISolone (SOLU-MEDROL) injection  1,000 mg Intravenous Q24H  . pantoprazole  40 mg Oral Daily   Continuous Infusions:   LOS: 1 day    Damaris Abeln Jaynie Collins, MD [facility] Pager [phone]  If 7PM-7AM, please contact night-coverage [email or web address] Password [email or web address] 06/12/2016, 11:42 AM

## 2016-06-12 NOTE — Progress Notes (Signed)
Subjective: Feels slightly weaker  Exam: Vitals:   06/11/16 1646 06/11/16 2139  BP: 131/73 118/61  Pulse: 100 100  Resp: 20 18  Temp: 98 F (36.7 C) 98.7 F (37.1 C)   Gen: In bed, NAD Resp: non-labored breathing, no acute distress Abd: soft, nt  Neuro: MS: awake, alert,  CN: EOMI, face symmetric Motor: She gives 4/5 strength in all extermities Sensory:intact to LT  Pertinent Labs: CBG - 179  Impression: 21 yo F with MS exacerbation, getting IV solumedrol, suspect will need 5 day course.   Recommendations: 1) continue IV solumedrol 500mg  BID 2) PT,OT  Ritta Slot, MD [facility] [phone]  If 7pm- 7am, please page neurology on call as listed in AMION.

## 2016-06-13 LAB — GLUCOSE, CAPILLARY
Glucose-Capillary: 141 mg/dL — ABNORMAL HIGH (ref 65–99)
Glucose-Capillary: 148 mg/dL — ABNORMAL HIGH (ref 65–99)
Glucose-Capillary: 148 mg/dL — ABNORMAL HIGH (ref 65–99)

## 2016-06-13 NOTE — Progress Notes (Signed)
PROGRESS NOTE    Kelly Benson  CXK:[id number] DOB: 1996-03-02 DOA: 06/10/2016 PCP: Concepcion Living, NP   Brief Narrative: 21 y.o. female with medical history significant of multiple sclerosis diagnosed 08/2015; who presents with complaints of dizziness for the last 5 days. She did not follow-up with neurology at that time due to lack of a job and insurance. Patient still reports being without insurance. MRI of the brain and cervcial spine revealed at least 3 new supratentorial white matter lesions progression of demyelinization. Neurology was consulted and recommended  steroids of 1 g IV daily.   Assessment & Plan:   #  Multiple sclerosis exacerbation [facility]): -Neurology consult appreciated. Continue IV Solu-Medrol, Protonix. Further treatment and duration of steroid as per neurologist. Patient likely needs 10 doses of Solu-Medrol total as per neurologist note. -Patient is clinically improving. -PT, OT evaluation requested.  -Child psychotherapist and care management team to follow-up for discharge plan, medications. -Continue to monitor blood sugar level.  # Does not have health insurance: Case manager referral. Also discussed with the Child psychotherapist.  DVT prophylaxis: Lovenox subcutaneous Code Status: Full code Family Communication: No family present at bedside Disposition Plan: Likely discharge home in 3-4 days after completing the course of IV Solu-Medrol.    Consultants:   Neurology  Procedures: MRI brain Antimicrobials: None  Subjective: Patient was seen and examined at bedside. No new event. Denied chest pain, shortness of breath.  ObPulse: 110 99 100 100  Resp: 18 18 20 18   Temp: 98.4 F (36.9 C)  98 F (36.7 C) 98.7 F (37.1 C)  TempSrc: Oral     SpO2: 100% 97% 98% 98%  Weight:   (!) 181.1 kg (399 lb 4.1 oz)   Height:   5\' 4"  (1.626 m)     Intake/Output Summary (Last 24 hours) at 06/12/16 1142 Last data filed at 06/12/16 9702  Gross per 24 hour  Intake               418 ml  Output                0 ml  Net              418 ml   Filed Weights   06/10/16 1554 06/11/16 1646  Weight: 79.4 kg (175 lb) (!) 181.1 kg (399 lb 4.1 oz)    Examination:  General exam: Not in distress, lying in bed comfortable Respiratory system: Clear bilateral, respiratory effort normal. Cardiovascular system: S1 & S2 heard, RRR.  No pedal edema. Gastrointestinal system: Abdomen is nondistended, soft and nontender. Normal bowel sounds heard. Central nervous system: PERLA, Alert and oriented. No focal neurological deficits. Extremities: Symmetric 5 x 5 power. Unchanged Skin: No rashes, lesions or ulcers Psychiatry: Judgement and insight appear normal. Mood & affect appropriate.     Data Reviewed: I have personally reviewed following labs and imaging studies  CBC:  Recent Labs Lab 06/10/16 1618 06/11/16 0529  WBC 6.0 7.2  HGB 13.0 12.8  HCT 40.2 39.0  MCV 82.4 82.1  PLT 310 305   Basic Metabolic Panel:  Recent Labs Lab 06/10/16 1618  NA 140  K 3.6  CL 103  CO2 26  GLUCOSE 88  BUN 11  CREATININE 0.60  CALCIUM 9.3   GFR: Estimated Creatinine Clearance: 183.4 mL/min (by C-G formula based on SCr of 0.6 mg/dL). Liver Function Tests: No results for input(s): AST, ALT, ALKPHOS, BILITOT, PROT, ALBUMIN in the last 168 hours. No results for  input(s): LIPASE, AMYLASE in the last 168 hours. No results for input(s): AMMONIA in the last 168 hours. Coagulation Profile: No results for input(s): INR, PROTIME in the last 168 hours. Cardiac Enzymes: No results for input(s): CKTOTAL, CKMB, CKMBINDEX, TROPONINI in the last 168 hours. BNP (last 3 results) No results for input(s): PROBNP in the last 8760 hours. HbA1C: No results for input(s): HGBA1C in the last 72 hours. CBG:  Recent Labs Lab 06/11/16 0757 06/12/16 0802  GLUCAP 182* 161*   Lipid Profile: No results for input(s): CHOL, HDL, LDLCALC, TRIG, CHOLHDL, LDLDIRECT in the last 72 hours. Thyroid  Function Tests: No results for input(s): TSH, T4TOTAL, FREET4, T3FREE, THYROIDAB in the last 72 hours. Anemia Panel: No results for input(s): VITAMINB12, FOLATE, FERRITIN, TIBC, IRON, RETICCTPCT in the last 72 hours. Sepsis Labs: No results for input(s): PROCALCITON, LATICACIDVEN in the last 168 hours.  No results found for this or any previous visit (from the past 240 hour(s)).       Radiology Studies: Mr Laqueta Jean And Wo Contrast  Result Date: 06/11/2016 CLINICAL DATA:  Intermittent dizziness for 5 days. History of multiple sclerosis. EXAM: MRI HEAD WITHOUT AND WITH CONTRAST MRI CERVICAL SPINE WITHOUT AND WITH CONTRAST TECHNIQUE: Multiplanar, multiecho pulse sequences of the brain and surrounding structures, and cervical spine, to include the craniocervical junction and cervicothoracic junction, were obtained without and with intravenous contrast. CONTRAST:  17mL MULTIHANCE GADOBENATE DIMEGLUMINE 529 MG/ML IV SOLN COMPARISON:  MRI head and cervical spine September 03, 2015 FINDINGS: MRI HEAD FINDINGS INTRACRANIAL CONTENTS: No reduced diffusion to suggest acute ischemia or hyperacute demyelination. T2 shine through associated with multiple white matter lesions. Stable subcentimeter posterior medullary lesion, less conspicuous RIGHT cerebellar lesion. Greater than expected FLAIR hyperintense signal within the periaqueductal gray, similar. At least 3 new supratentorial white matter lesions in a periventricular distribution. Greater than 10 supratentorial white matter lesions including bilateral temporal lobes, many of which radiate from the periventricular margin. Subcentimeter LEFT temporal and LEFT lesions demonstrate low T1 signal compatible with black holes of demyelination. Frontal cortical to juxta cortical lesions. Varying faint enhancement of a few supratentorial lesions. Less conspicuous RIGHT and similar LEFT thalamus lesions. Ventricles and sulci normal for patient's age. No midline shift, mass  effect or masses. No abnormal extra-axial fluid collections, enhancement or masses. VASCULAR: Normal major intracranial vascular flow voids present at skull base. SKULL AND UPPER CERVICAL SPINE: No abnormal sellar expansion. No suspicious calvarial bone marrow signal. Craniocervical junction maintained. SINUSES/ORBITS: The mastoid air-cells and included paranasal sinuses are well-aerated.The included ocular globes and orbital contents are non-suspicious. Optic neuritis could not excluded on the basis of old brain MRI. OTHER: None. MRI CERVICAL SPINE FINDINGS- moderately motion degraded axial post gadolinium, additional move mildly motion degraded axial pre contrast sequences. ALIGNMENT: Straightened cervical lordosis.  No malalignment. VERTEBRAE/DISCS: Vertebral bodies are intact. 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 Intervertebral disc morphology's and signal are normal. No abnormal bone marrow signal. No abnormal osseous or intradiscal enhancement. CORD:Due to mild patient motion, limited assessment for cord signal abnormality on the axial sequences. However, patchy bright STIR signal within the cervical spinal cord at C2-3 with mild expansion. To lesser extent patchy non expansile bright STIR signal at C4, C5 and C7-T1. No abnormal enhancement. POSTERIOR FOSSA, VERTEBRAL ARTERIES, PARASPINAL TISSUES: No MR findings of ligamentous injury. Vertebral artery flow voids present. Included posterior fossa and paraspinal soft tissues are normal. DISC LEVELS: C2-3 through C7-T1: No disc bulge, canal stenosis nor neural foraminal narrowing. IMPRESSION: MRI HEAD:  At least 3 new supratentorial white matter lesions compatible with progression of demyelination. Variable enhancement compatible with acute to subacute lesions in a background of chronic supra- and infratentorial demyelination. No parenchymal brain volume loss for age. MRI CERVICAL SPINE:  Limited motion degraded examination. Multiple spinal cord plaques compatible with  demyelination, relatively stable though limited assessment due to motion. No abnormal enhancement. No neurocompression or acute osseous process. Electronically Signed   By: Awilda Metro M.D.   On: 06/11/2016 02:31   Mr Cervical Spine W Or Wo Contrast  Result Date: 06/11/2016 CLINICAL DATA:  Intermittent dizziness for 5 days. History of multiple sclerosis. EXAM: MRI HEAD WITHOUT AND WITH CONTRAST MRI CERVICAL SPINE WITHOUT AND WITH CONTRAST TECHNIQUE: Multiplanar, multiecho pulse sequences of the brain and surrounding structures, and cervical spine, to include the craniocervical junction and cervicothoracic junction, were obtained without and with intravenous contrast. CONTRAST:  17mL MULTIHANCE GADOBENATE DIMEGLUMINE 529 MG/ML IV SOLN COMPARISON:  MRI head and cervical spine September 03, 2015 FINDINGS: MRI HEAD FINDINGS INTRACRANIAL CONTENTS: No reduced diffusion to suggest acute ischemia or hyperacute demyelination. T2 shine through associated with multiple white matter lesions. Stable subcentimeter posterior medullary lesion, less conspicuous RIGHT cerebellar lesion. Greater than expected FLAIR hyperintense signal within the periaqueductal gray, similar. At least 3 new supratentorial white matter lesions in a periventricular distribution. Greater than 10 supratentorial white matter lesions including bilateral temporal lobes, many of which radiate from the periventricular margin. Subcentimeter LEFT temporal and LEFT lesions demonstrate low T1 signal compatible with black holes of demyelination. Frontal cortical to juxta cortical lesions. Varying faint enhancement of a few supratentorial lesions. Less conspicuous RIGHT and similar LEFT thalamus lesions. Ventricles and sulci normal for patient's age. No midline shift, mass effect or masses. No abnormal extra-axial fluid collections, enhancement or masses. VASCULAR: Normal major intracranial vascular flow voids present at skull base. SKULL AND UPPER CERVICAL  SPINE: No abnormal sellar expansion. No suspicious calvarial bone marrow signal. Craniocervical junction maintained. SINUSES/ORBITS: The mastoid air-cells and included paranasal sinuses are well-aerated.The included ocular globes and orbital contents are non-suspicious. Optic neuritis could not excluded on the basis of old brain MRI. OTHER: None. MRI CERVICAL SPINE FINDINGS- moderately motion degraded axial post gadolinium, additional move mildly motion degraded axial pre contrast sequences. ALIGNMENT: Straightened cervical lordosis.  No malalignment. VERTEBRAE/DISCS: Vertebral bodies are intact. 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 Intervertebral disc morphology's and signal are normal. No abnormal bone marrow signal. No abnormal osseous or intradiscal enhancement. CORD:Due to mild patient motion, limited assessment for cord signal abnormality on the axial sequences. However, patchy bright STIR signal within the cervical spinal cord at C2-3 with mild expansion. To lesser extent patchy non expansile bright STIR signal at C4, C5 and C7-T1. No abnormal enhancement. POSTERIOR FOSSA, VERTEBRAL ARTERIES, PARASPINAL TISSUES: No MR findings of ligamentous injury. Vertebral artery flow voids present. Included posterior fossa and paraspinal soft tissues are normal. DISC LEVELS: C2-3 through C7-T1: No disc bulge, canal stenosis nor neural foraminal narrowing. IMPRESSION: MRI HEAD: At least 3 new supratentorial white matter lesions compatible with progression of demyelination. Variable enhancement compatible with acute to subacute lesions in a background of chronic supra- and infratentorial demyelination. No parenchymal brain volume loss for age. MRI CERVICAL SPINE:  Limited motion degraded examination. Multiple spinal cord plaques compatible with demyelination, relatively stable though limited assessment due to motion. No abnormal enhancement. No neurocompression or acute osseous process. Electronically Signed   By: Awilda Metro  M.D.   On: 06/11/2016 02:31  Scheduled Meds: . enoxaparin (LOVENOX) injection  40 mg Subcutaneous Q24H  . methylPREDNISolone (SOLU-MEDROL) injection  1,000 mg Intravenous Q24H  . pantoprazole  40 mg Oral Daily   Continuous Infusions:   LOS: 1 day    Melodie Ashworth Jaynie Collins, MD [facility] Pager [phone]  If 7PM-7AM, please contact night-coverage [email or web address] Password [facility] 06/12/2016, 11:42 AM

## 2016-06-13 NOTE — Progress Notes (Signed)
Subjective: Much improved. Has been up and walking.   Exam: Vitals:   06/13/16 0617 06/13/16 1353  BP: 118/61 (!) 130/59  Pulse: 88 89  Resp: 18 15  Temp: 98.1 F (36.7 C) 97.4 F (36.3 C)   Gen: In bed, NAD Resp: non-labored breathing, no acute distress Abd: soft, nt  Neuro: MS: awake, alert,  CN: EOMI, face symmetric Motor: She gives 4/5 strength in all extermities Sensory:intact to LT  Pertinent Labs: CBG - 179  Impression: 21 yo F with MS exacerbation, getting IV solumedrol. Tomorrow morning after her AM dose, she will have had 4g total IV steroid. If back to baseline, I think we could stop there.   Recommendations: 1) continue IV solumedrol 500mg  BID 2) Likely d/c tomorrow.   Ritta Slot, MD [facility] [phone]  If 7pm- 7am, please page neurology on call as listed in AMION.

## 2016-06-13 NOTE — Evaluation (Signed)
Occupational Therapy Evaluation Patient Details Name: Kelly Benson MRN: [id number] DOB: 1996/02/19 Today's Date: 06/13/2016    History of Present Illness 21 y.o. female with medical history significant of multiple sclerosis diagnosed 08/2015; who presents with complaints of dizziness for the last 5 days.  MRI of the brain and cervcial spine revealed at least 3 new supratentorial white matter lesions progression of demyelinization.   Clinical Impression   Pt admitted with the above diagnoses and presents with below problem list. Pt will benefit from continued acute OT to address the below listed deficits and maximize independence with basic ADLs prior to d/c home with family assisting as needed. PTA pt was independent with ADLs. Pt is currently setup to supervision with most ADLs, min guard for tub transfer. Began education on basic energy conservation strategies.     Follow Up Recommendations  No OT follow up    Equipment Recommendations  None recommended by OT    Recommendations for Other Services       Precautions / Restrictions Precautions Precautions: Fall Restrictions Weight Bearing Restrictions: No      Mobility Bed Mobility               General bed mobility comments: Pt exiting bed while therapist out of room to retrieve linens.  Transfers Overall transfer level: Needs assistance Equipment used: 1 person hand held assist Transfers: Sit to/from Stand Sit to Stand: Supervision         General transfer comment: for safety    Balance Overall balance assessment: Needs assistance         Standing balance support: Single extremity supported;No upper extremity supported Standing balance-Leahy Scale: Fair                              ADL Overall ADL's : Needs assistance/impaired Eating/Feeding: Set up;Sitting   Grooming: Set up;Oral care;Wash/dry face;Wash/dry hands;Standing   Upper Body Bathing: Set up;Sitting   Lower Body  Bathing: Modified independent;Sit to/from stand   Upper Body Dressing : Set up;Sitting   Lower Body Dressing: Modified independent;Sit to/from stand   Toilet Transfer: Supervision/safety;Ambulation;Grab bars   Toileting- Clothing Manipulation and Hygiene: Supervision/safety;Set up;Sitting/lateral lean;Sit to/from stand   Tub/ Shower Transfer: Min guard;Tub transfer;Ambulation Tub/Shower Transfer Details (indicate cue type and reason): simulated tub transfer. external support for balance. min guard for safety with tub transfer Functional mobility during ADLs: Supervision/safety General ADL Comments: Pt asking to brush teeth at sink and take a shower. Pt completed bed mobility, in-room functional mobility, grooming tasks, and simulated tub shower transfer as detailed above. Educated on energy conservation strategies.     Vision     Perception     Praxis      Pertinent Vitals/Pain Pain Assessment: Faces Faces Pain Scale: No hurt     Hand Dominance Right   Extremity/Trunk Assessment Upper Extremity Assessment Upper Extremity Assessment: Overall WFL for tasks assessed   Lower Extremity Assessment Lower Extremity Assessment: Defer to PT evaluation       Communication Communication Communication: No difficulties   Cognition Arousal/Alertness: Awake/alert Behavior During Therapy: Flat affect Overall Cognitive Status: Within Functional Limits for tasks assessed                     General Comments       Exercises       Shoulder Instructions      Home Living Family/patient expects to be discharged  to:: Private residence Living Arrangements: Spouse/significant other Available Help at Discharge: Family Type of Home: Apartment Home Access: Stairs to enter Secretary/administrator of Steps: 1 flight Entrance Stairs-Rails: Right Home Layout: One level     Bathroom Shower/Tub: Tub/shower unit Shower/tub characteristics: Curtain       Home Equipment: None           Prior Functioning/Environment Level of Independence: Independent        Comments: 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 Problem List: Impaired balance (sitting and/or standing);Decreased knowledge of use of DME or AE;Decreased knowledge of precautions;Decreased activity tolerance   OT Treatment/Interventions: Self-care/ADL training;Energy conservation;DME and/or AE instruction;Therapeutic activities;Patient/family education;Balance training    OT Goals(Current goals can be found in the care plan section) Acute Rehab OT Goals Patient Stated Goal: to return to independent OT Goal Formulation: With patient Time For Goal Achievement: 06/20/16 Potential to Achieve Goals: Good ADL Goals Pt Will Perform Grooming: with modified independence (full grooming session) Pt Will Perform Tub/Shower Transfer: Tub transfer;with supervision;ambulating Additional ADL Goal #1: Pt will independently verbalize 3 energy conservation strategies to incorporate into ADLs as needed.  OT Frequency: Min 1X/week   Barriers to D/C:            Co-evaluation              End of Session Nurse Communication: Other (comment) (Pt requesting to shower. )  Activity Tolerance: Patient tolerated treatment well Patient left: Other (comment) (in shower. Nursing notified. )   Time: 1610-9604 OT Time Calculation (min): 14 min Charges:  OT General Charges $OT Visit: 1 Procedure OT Evaluation $OT Eval Low Complexity: 1 Procedure G-Codes:    Pilar Grammes June 14, 2016, 11:58 AM

## 2016-06-13 NOTE — Progress Notes (Signed)
Physical Therapy Treatment Patient Details Name: Kelly Benson MRN: [id number] DOB: 23-Nov-1995 Today's Date: 06/13/2016    History of Present Illness 21 y.o. female with medical history significant of multiple sclerosis diagnosed 08/2015; who presents with complaints of dizziness for the last 5 days.  MRI of the brain and cervcial spine revealed at least 3 new supratentorial white matter lesions progression of demyelinization.    PT Comments    Pt refused to use cane during treatment because she stated she wasn't being provided one so she didn't see the need. Able to walk up flight of stairs with use of 2 rails and min guard for safety with no c/o of increased dizziness or lightheaded feeling. Educated patient on importance of self-monitoring to not over do activity once discharged. Recommended patient get a cane from local drug store ([facility]) to use for home for safety. Pt expecting to be discharged to private residence Saturday.    Follow Up Recommendations  No PT follow up     Equipment Recommendations  Cane    Recommendations for Other Services       Precautions / Restrictions Precautions Precautions: Fall Restrictions Weight Bearing Restrictions: No    Mobility  Bed Mobility Overal bed mobility: Independent             General bed mobility comments: able to get OOB without cuing or assitance  Transfers Overall transfer level: Needs assistance Equipment used: 1 person hand held assist Transfers: Sit to/from Stand Sit to Stand: Supervision         General transfer comment: for safety due to pt c/o lightheadedness  Ambulation/Gait Ambulation/Gait assistance: Min guard Ambulation Distance (Feet): 350 Feet Assistive device: 1 person hand held assist Gait Pattern/deviations: Step-through pattern;Decreased stride length     General Gait Details: Refused using cane during tx because one won't be provided to her for ins and unwilling to go buy one post  d/c; performed HHA on R and intermittent use of rail on left. Pt able to ambulate with min guard for safety to prevent fall when she complained of increased light headedness during last 100 ft of ambulation.  Took standing rest break to reduce dizziness before entering room.    Stairs            Wheelchair Mobility    Modified Rankin (Stroke Patients Only)       Balance Overall balance assessment: Needs assistance         Standing balance support: Single extremity supported;No upper extremity supported   Standing balance comment: intermittent UE use for support during ambulation to avoid falling.                     Cognition Arousal/Alertness: Awake/alert Behavior During Therapy: WFL for tasks assessed/performed Overall Cognitive Status: Within Functional Limits for tasks assessed                      Exercises      General Comments        Pertinent Vitals/Pain Pain Assessment: No/denies pain    Home Living                      Prior Function            PT Goals (current goals can now be found in the care plan section) Acute Rehab PT Goals Patient Stated Goal: to go home PT Goal Formulation: With patient Potential to Achieve Goals:  Good Progress towards PT goals: Progressing toward goals    Frequency    Min 3X/week      PT Plan Current plan remains appropriate    Co-evaluation             End of Session Equipment Utilized During Treatment: Gait belt Activity Tolerance: Patient limited by fatigue;Other (comment) (pt c/o of feeling light headed) Patient left: with call bell/phone within reach;in bed     Time: 7517-0017 PT Time Calculation (min) (ACUTE ONLY): 15 min  Charges:  $Gait Training: 8-22 mins                    G Codes:      [facility] June 29, 2016, 4:40 PM Kerrin Mo, [place] Pager [phone]

## 2016-06-14 LAB — GLUCOSE, CAPILLARY: Glucose-Capillary: 134 mg/dL — ABNORMAL HIGH (ref 65–99)

## 2016-06-14 NOTE — Progress Notes (Signed)
Nsg Discharge Note  Admit Date:  06/10/2016 Discharge date: 06/14/2016   Arlina Robes to be D/C'd Home per MD order.  AVS completed.  Copy for chart, and copy for patient signed, and dated. Patient/caregiver able to verbalize understanding.  Discharge Medication: Allergies as of 06/14/2016   No Known Allergies     Medication List    You have not been prescribed any medications.     Discharge Assessment: Vitals:   06/13/16 2124 06/14/16 0539  BP: 125/64 (!) 141/67  Pulse: 71 73  Resp: 18 18  Temp: 98.4 F (36.9 C) 98.2 F (36.8 C)   Skin clean, dry and intact without evidence of skin break down, no evidence of skin tears noted. IV catheter discontinued intact. Site without signs and symptoms of complications - no redness or edema noted at insertion site, patient denies c/o pain - only slight tenderness at site.  Dressing with slight pressure applied.  D/c Instructions-Education: Discharge instructions given to patient/family with verbalized understanding. D/c education completed with patient/family including follow up instructions, medication list, d/c activities limitations if indicated, with other d/c instructions as indicated by MD - patient able to verbalize understanding, all questions fully answered. Patient instructed to return to ED, call 911, or call MD for any changes in condition.  Patient escorted via WC, and D/C home via private auto.  Camillo Flaming, RN 06/14/2016 1:00 PM

## 2016-06-14 NOTE — Discharge Summary (Signed)
Physician Discharge Summary  Kelly Benson KZS:[id number] DOB: 08-23-1995 DOA: 06/10/2016  PCP: Concepcion Living, NP  Admit date: 06/10/2016 Discharge date: 06/14/2016  Admitted From:home Disposition:home  Recommendations for Outpatient Follow-up:  1. Follow up with PCP and neurologist in 1-2 weeks 2. Please obtain BMP/CBC in one week   Home Health:no Equipment/Devices:no Discharge Condition:stable CODE STATUS:full Diet recommendation:Heart healthy.  Brief/Interim Summary:20 y.o.femalewith medical history significant of multiple sclerosis diagnosed 08/2015; who presents with complaints of dizziness for the last 5 days. She did not follow-up with neurology at that time due to lack of a job and insurance. Patient still reports being without insurance. MRI of the brain and cervcialspine revealed at least 3 new supratentorial white matter lesions progression of demyelinization.  Patient was evaluated by a neurologist. Treated with IV Solu-Medrol, Total 4 g  For multiple sclerosis flare. Patient reported significant clinical improvement and back to baseline today. She has no systemic symptoms today. Denied headache, dizziness, nausea, vomiting, chest pain, shortness of breath or abdominal pain. I discussed with neurologist today regarding discharge plan. Outpatient referral to neurologist was made. I also discussed with the social worker to help patient's PCP follow-up.  Patient is medically stable at this time to discharge. Education provided to the patient at bedside regarding the importance of outpatient follow-up.  Discharge Diagnoses:  Principal Problem:   Multiple sclerosis exacerbation (HCC) Active Problems:   Does not have health insurance    Discharge Instructions  Discharge Instructions    Ambulatory referral to Neurology    Complete by:  As directed    An appointment is requested in approximately: 2 weeks   Call MD for:  difficulty breathing, headache or visual  disturbances    Complete by:  As directed    Call MD for:  extreme fatigue    Complete by:  As directed    Call MD for:  hives    Complete by:  As directed    Call MD for:  persistant dizziness or light-headedness    Complete by:  As directed    Call MD for:  persistant nausea and vomiting    Complete by:  As directed    Call MD for:  severe uncontrolled pain    Complete by:  As directed    Call MD for:  temperature >100.4    Complete by:  As directed    Diet - low sodium heart healthy    Complete by:  As directed    Discharge instructions    Complete by:  As directed    Please follow up with your PCP and follow-up with neurologist.   Increase activity slowly    Complete by:  As directed      Allergies as of 06/14/2016   No Known Allergies     Medication List    You have not been prescribed any medications.    Follow-up Information    Concepcion Living, NP. Schedule an appointment as soon as possible for a visit in 1 week(s).   Specialty:  Family Medicine Contact information: [address] [place] [address] [phone]        [facility] Follow up.   Why:  or follow up with your PCP Contact information: [address] [place] [phone]         No Known Allergies  Consultations: Neurologist  Procedures/Studies: None  Subjective: Patient was seen and examined at bedside. Patient reported she is feeling much  better. she reported back to baseline. She denied headache, dizziness, nausea, vomiting, chest pain, shortness of breath, abdominal pain. No extremities weakness.   Discharge Exam: Vitals:   06/13/16 2124 06/14/16 0539  BP: 125/64 (!) 141/67  Pulse: 71 73  Resp: 18 18  Temp: 98.4 F (36.9 C) 98.2 F (36.8 C)   Vitals:   06/13/16 0617 06/13/16 1353 06/13/16 2124 06/14/16 0539  BP: 118/61 (!) 130/59 125/64 (!) 141/67  Pulse: 88 89 71 73  Resp: 18 15 18 18   Temp: 98.1  F (36.7 C) 97.4 F (36.3 C) 98.4 F (36.9 C) 98.2 F (36.8 C)  TempSrc:  Oral Oral Oral  SpO2: 97% 99% 98% 98%  Weight:      Height:        General: Pt is alert, awake, not in acute distress Cardiovascular: RRR, S1/S2 +, no rubs, no gallops Respiratory: CTA bilaterally, no wheezing, no rhonchi Abdominal: Soft, NT, ND, bowel sounds + Extremities: no edema, no cyanosis Neurology: Alert, awake, oriented, muscle strength 5 over 5 in all extremities   The results of significant diagnostics from this hospitalization (including imaging, microbiology, ancillary and laboratory) are listed below for reference.     Microbiology: No results found for this or any previous visit (from the past 240 hour(s)).   Labs: BNP (last 3 results) No results for input(s): BNP in the last 8760 hours. Basic Metabolic Panel:  Recent Labs Lab 06/10/16 1618  NA 140  K 3.6  CL 103  CO2 26  GLUCOSE 88  BUN 11  CREATININE 0.60  CALCIUM 9.3   Liver Function Tests: No results for input(s): AST, ALT, ALKPHOS, BILITOT, PROT, ALBUMIN in the last 168 hours. No results for input(s): LIPASE, AMYLASE in the last 168 hours. No results for input(s): AMMONIA in the last 168 hours. CBC:  Recent Labs Lab 06/10/16 1618 06/11/16 0529  WBC 6.0 7.2  HGB 13.0 12.8  HCT 40.2 39.0  MCV 82.4 82.1  PLT 310 305   Cardiac Enzymes: No results for input(s): CKTOTAL, CKMB, CKMBINDEX, TROPONINI in the last 168 hours. BNP: Invalid input(s): POCBNP CBG:  Recent Labs Lab 06/12/16 1710 06/13/16 0756 06/13/16 1216 06/13/16 1732 06/14/16 0759  GLUCAP 128* 141* 148* 148* 134*   D-Dimer No results for input(s): DDIMER in the last 72 hours. Hgb A1c No results for input(s): HGBA1C in the last 72 hours. Lipid Profile No results for input(s): CHOL, HDL, LDLCALC, TRIG, CHOLHDL, LDLDIRECT in the last 72 hours. Thyroid function studies No results for input(s): TSH, T4TOTAL, T3FREE, THYROIDAB in the last 72  hours.  Invalid input(s): FREET3 Anemia work up No results for input(s): VITAMINB12, FOLATE, FERRITIN, TIBC, IRON, RETICCTPCT in the last 72 hours. Urinalysis    Component Value Date/Time   COLORURINE STRAW (A) 06/10/2016 1711   APPEARANCEUR CLEAR 06/10/2016 1711   LABSPEC 1.014 06/10/2016 1711   PHURINE 7.0 06/10/2016 1711   GLUCOSEU 50 (A) 06/10/2016 1711   HGBUR NEGATIVE 06/10/2016 1711   BILIRUBINUR NEGATIVE 06/10/2016 1711   KETONESUR NEGATIVE 06/10/2016 1711   PROTEINUR NEGATIVE 06/10/2016 1711   UROBILINOGEN 0.2 01/30/2016 1556   NITRITE NEGATIVE 06/10/2016 1711   LEUKOCYTESUR NEGATIVE 06/10/2016 1711   Sepsis Labs Invalid input(s): PROCALCITONIN,  WBC,  LACTICIDVEN Microbiology No results found for this or any previous visit (from the past 240 hour(s)).   Time coordinating discharge: 25 minutes  SIGNED:   Maxie Barb, MD  [facility] 06/14/2016, 11:49 AM  If 7PM-7AM, please contact  night-coverage [email or web address] Password [email or web address]

## 2016-06-14 NOTE — Progress Notes (Signed)
Subjective: Patient is currently feeling better baseline desires to go home.  Exam: Vitals:   06/13/16 2124 06/14/16 0539  BP: 125/64 (!) 141/67  Pulse: 71 73  Resp: 18 18  Temp: 98.4 F (36.9 C) 98.2 F (36.8 C)    Gen: In bed, NAD Resp: non-labored breathing, no acute distress Abd: soft, nt  Neuro: MS: awake, alert,  CN: EOMI, face symmetric Motor: She gives 5/5 strength in all extermities Sensory:intact to LT     Pertinent Labs/Diagnostics: None  Kelly Morn PA-C [facility] [phone]  Impression: 21 year old female with MS exacerbation currently receiving IV Solu-Medrol. At this point patient feels as though she is back to her baseline. I feel that she could be discharged to follow up with outpatient neurology. I have requested follow up.    Recommendations: 1) No further recommendations at this time. Please call with further questions or concerns.    Kelly Slot, MD [facility] [phone]  If 7pm- 7am, please page neurology on call as listed in AMION.  06/14/2016, 10:12 AM

## 2016-06-14 NOTE — Care Management Note (Signed)
Case Management Note  Patient Details  Name: Kelly Benson MRN: [id number] Date of Birth: 1995/06/10  Subjective/Objective:       MS             Action/Plan: Discharge Planning: AVS reviewed: Chart reviewed. Pt works part-time, but currently [employer] No meds. No NCM needs identified. Lives at home with husband.   PCP Henrietta Hoover   Expected Discharge Date:  06/14/16               Expected Discharge Plan:  Home/Self Care  In-House Referral:  Financial Counselor  Discharge planning Services  CM Consult  Post Acute Care Choice:  NA Choice offered to:  NA  DME Arranged:  N/A DME Agency:  NA  HH Arranged:  NA HH Agency:  NA  Status of Service:  Completed, signed off  If discussed at Long Length of Stay Meetings, dates discussed:    Additional Comments:  Elliot Cousin, RN 06/14/2016, 12:13 PM

## 2016-09-23 ENCOUNTER — Ambulatory Visit: Payer: Self-pay | Admitting: Family Medicine

## 2017-03-11 ENCOUNTER — Encounter: Payer: Self-pay | Admitting: Family Medicine

## 2017-03-11 ENCOUNTER — Ambulatory Visit (INDEPENDENT_AMBULATORY_CARE_PROVIDER_SITE_OTHER): Payer: Self-pay | Admitting: Family Medicine

## 2017-03-11 ENCOUNTER — Other Ambulatory Visit (HOSPITAL_COMMUNITY)
Admission: RE | Admit: 2017-03-11 | Discharge: 2017-03-11 | Disposition: A | Discharge status: Home / Self Care | Payer: Self-pay | Source: Ambulatory Visit | Attending: Family Medicine | Admitting: Family Medicine

## 2017-03-11 VITALS — BP 134/68 | HR 88 | Temp 98.7°F | Ht 64.0 in | Wt 195.0 lb

## 2017-03-11 DIAGNOSIS — G35 Multiple sclerosis: Secondary | ICD-10-CM

## 2017-03-11 DIAGNOSIS — B370 Candidal stomatitis: Secondary | ICD-10-CM

## 2017-03-11 DIAGNOSIS — N898 Other specified noninflammatory disorders of vagina: Secondary | ICD-10-CM

## 2017-03-11 DIAGNOSIS — R5383 Other fatigue: Secondary | ICD-10-CM

## 2017-03-11 LAB — COMPLETE METABOLIC PANEL WITH GFR
AG Ratio: 1.5 (calc) (ref 1.0–2.5)
ALT: 10 U/L (ref 6–29)
AST: 15 U/L (ref 10–30)
Albumin: 4.2 g/dL (ref 3.6–5.1)
Alkaline phosphatase (APISO): 61 U/L (ref 33–115)
BUN: 14 mg/dL (ref 7–25)
CO2: 21 mmol/L (ref 20–32)
Calcium: 9.3 mg/dL (ref 8.6–10.2)
Chloride: 106 mmol/L (ref 98–110)
Creat: 0.63 mg/dL (ref 0.50–1.10)
GFR, Est African American: 147 mL/min/{1.73_m2} (ref >=60)
GFR, Est Non African American: 126 mL/min/{1.73_m2} (ref >=60)
Globulin: 2.8 g/dL (calc) (ref 1.9–3.7)
Glucose, Bld: 83 mg/dL (ref 65–99)
Potassium: 3.9 mmol/L (ref 3.5–5.3)
Sodium: 140 mmol/L (ref 135–146)
Total Bilirubin: 0.5 mg/dL (ref 0.2–1.2)
Total Protein: 7 g/dL (ref 6.1–8.1)

## 2017-03-11 LAB — POCT URINALYSIS DIP (DEVICE)
Bilirubin Urine: NEGATIVE
Glucose, UA: NEGATIVE mg/dL
Hgb urine dipstick: NEGATIVE
Ketones, ur: NEGATIVE mg/dL
Leukocytes, UA: NEGATIVE
Nitrite: NEGATIVE
Protein, ur: NEGATIVE mg/dL
Specific Gravity, Urine: 1.02 (ref 1.005–1.030)
Urobilinogen, UA: 1 mg/dL (ref 0.0–1.0)
pH: 7 (ref 5.0–8.0)

## 2017-03-11 LAB — CBC WITH DIFFERENTIAL/PLATELET
Basophils Absolute: 27 cells/uL (ref 0–200)
Basophils Relative: 0.4 %
Eosinophils Absolute: 80 cells/uL (ref 15–500)
Eosinophils Relative: 1.2 %
HCT: 38.6 % (ref 35.0–45.0)
Hemoglobin: 13 g/dL (ref 11.7–15.5)
Lymphs Abs: 1534 cells/uL (ref 850–3900)
MCH: 27.6 pg (ref 27.0–33.0)
MCHC: 33.7 g/dL (ref 32.0–36.0)
MCV: 82 fL (ref 80.0–100.0)
MPV: 11.2 fL (ref 7.5–12.5)
Monocytes Relative: 10.3 %
Neutro Abs: 4368 cells/uL (ref 1500–7800)
Neutrophils Relative %: 65.2 %
Platelets: 327 10*3/uL (ref 140–400)
RBC: 4.71 10*6/uL (ref 3.80–5.10)
RDW: 12.5 % (ref 11.0–15.0)
Total Lymphocyte: 22.9 %
WBC mixed population: 690 cells/uL (ref 200–950)
WBC: 6.7 10*3/uL (ref 3.8–10.8)

## 2017-03-11 LAB — THYROID PANEL WITH TSH
Free Thyroxine Index: 2 (ref 1.4–3.8)
T3 Uptake: 32 % (ref 22–35)
T4, Total: 6.3 ug/dL (ref 5.1–11.9)
TSH: 1.14 mIU/L

## 2017-03-11 MED ORDER — MAGIC MOUTHWASH W/LIDOCAINE: 10.0000 mL | ORAL | 0 refills | Status: DC | PRN

## 2017-03-11 NOTE — Progress Notes (Signed)
Patient ID: Kelly Benson, female    DOB: 1995/11/20, 21 y.o.   MRN: [id number]  PCP: Bing Neighbors, FNP  Chief Complaint  Patient presents with  . Follow-up    6 month    Subjective:  HPI Kelly Benson is a 21 y.o. female with a history of multiple sclerosis, presents for 6 month follow-up. Kelly Benson complains of chronic vaginal discharge that is present intermittently over the course of two years. She complains of chronic coating of tongue that she describes as "thrush" causing bad breath. Kelly Benson reports chronic dry mouth which also exacerbates bad breath. Reports attempting relief with Biotin and drinking water,  which hasn't helped her symptoms. She denies vaginal itching, vaginal burning, and dysuria. She has not attempted relief with any OTC preparations. Kelly Benson suffers from MS, although is not taking any immunosuppressant therapy or any chronic medication for symptoms. She complains of chronic fatigue which doesn't improve with rest. She is not currently managed by neurology as she is uninsured. Denies chronic pain, visual, or balance disturbances.  Social History   Social History  . Marital status: Married    Spouse name: N/A  . Number of children: N/A  . Years of education: N/A   Occupational History  . Not on file.   Social History Main Topics  . Smoking status: Former Smoker    Types: Cigarettes  . Smokeless tobacco: Never Used  . Alcohol use No  . Drug use: No  . Sexual activity: Yes    Birth control/ protection: None   Other Topics Concern  . Not on file   Social History Narrative  . No narrative on file    Family History  Problem Relation Age of Onset  . Hypertension Father    Review of Systems  See HPI   Patient Active Problem List   Diagnosis Date Noted  . Multiple sclerosis exacerbation (HCC) 06/11/2016  . Does not have health insurance 06/11/2016  . Vaginal discharge 09/14/2015  . Multiple sclerosis (HCC)   . Dizziness   .  Demyelination, corpus callosum central (HCC)   . MS (multiple sclerosis) (HCC) 09/02/2015    No Known Allergies  Prior to Admission medications   Not on File    Past Medical, Surgical Family and Social History reviewed and updated.    Objective:   Today's Vitals   03/11/17 1432  BP: 134/68  Pulse: 88  Temp: 98.7 F (37.1 C)  TempSrc: Oral  SpO2: 99%  Weight: 195 lb (88.5 kg)  Height: 5\' 4"  (1.626 m)    Wt Readings from Last 3 Encounters:  03/11/17 195 lb (88.5 kg)  06/12/16 174 lb 13.2 oz (79.3 kg)  03/22/16 182 lb (82.6 kg)   Physical Exam  Constitutional: She is oriented to person, place, and time.  HENT:  Head: Normocephalic and atraumatic.  Right Ear: External ear normal.  Left Ear: External ear normal.  Nose: Nose normal.  Mouth/Throat: Uvula is midline and mucous membranes are normal. No oral lesions. No uvula swelling or dental caries. No oropharyngeal exudate, posterior oropharyngeal edema or posterior oropharyngeal erythema.  Eyes: Conjunctivae and EOM are normal. Pupils are equal, round, and reactive to light.  Neck: Normal range of motion. Neck supple.  Cardiovascular: Normal rate, regular rhythm, normal heart sounds and intact distal pulses.  Pulmonary/Chest: Effort normal and breath sounds normal.  Genitourinary:  Genitourinary Comments: Self vaginal specimen collected.   Lymphadenopathy:    She has no cervical adenopathy.  Neurological: She  is alert and oriented to person, place, and time.  Skin: Skin is warm and dry.  Psychiatric: Her speech is normal and behavior is normal. Judgment and thought content normal. Her mood appears anxious. Cognition and memory are normal.    Assessment & Plan:  1. Vaginal discharge- Cervicovaginal ancillary only, pending  2. Oral thrush, no visible evidence of thrush or oral lesions. Obtained an oral specimen sample. Culture pending.  3. Fatigue, unspecified type, will check TSH and CBC. 4. Multiple Sclerosis   Patient provided information about [facility], however left application in office. Will forward application via mail as patient needs a follow-up with neurology for evaluation and management of multiple sclerosis.   RTC: 6 months for complete physical exam.   Godfrey PickKimberly S. Tiburcio PeaHarris, MSN, FNP-C [facility]  [address] [place], [place] [address] [phone]

## 2017-03-13 LAB — CERVICOVAGINAL ANCILLARY ONLY
Bacterial vaginitis: NEGATIVE
Candida vaginitis: NEGATIVE
Chlamydia: NEGATIVE
Neisseria Gonorrhea: NEGATIVE
Trichomonas: NEGATIVE

## 2017-03-14 LAB — WOUND CULTURE
MICRO NUMBER:: 81215288
RESULT:: NORMAL
SPECIMEN QUALITY:: ADEQUATE

## 2017-03-16 ENCOUNTER — Telehealth: Payer: Self-pay | Admitting: Family Medicine

## 2017-03-16 NOTE — Telephone Encounter (Signed)
Contact patient to advise both her vaginal and oral swab was negative of abnormal bacteria or yeast.

## 2017-03-17 NOTE — Telephone Encounter (Signed)
Tried to contact patient no answer and no vm to leave a message

## 2017-03-17 NOTE — Telephone Encounter (Signed)
Erroneous

## 2017-03-18 NOTE — Telephone Encounter (Signed)
Patient notified of resutls 

## 2017-03-18 NOTE — Telephone Encounter (Signed)
Left another vm for patient to callback  

## 2017-07-24 ENCOUNTER — Emergency Department (HOSPITAL_COMMUNITY)
Admission: EM | Admit: 2017-07-24 | Discharge: 2017-07-25 | Disposition: A | Discharge status: Home / Self Care | Payer: Self-pay | Attending: Emergency Medicine | Admitting: Emergency Medicine

## 2017-07-24 DIAGNOSIS — Z87891 Personal history of nicotine dependence: Secondary | ICD-10-CM | POA: Insufficient documentation

## 2017-07-24 DIAGNOSIS — R109 Unspecified abdominal pain: Secondary | ICD-10-CM | POA: Insufficient documentation

## 2017-07-25 ENCOUNTER — Encounter (HOSPITAL_COMMUNITY): Payer: Self-pay | Admitting: Emergency Medicine

## 2017-07-25 LAB — CBC
HCT: 37.6 % (ref 36.0–46.0)
Hemoglobin: 12.3 g/dL (ref 12.0–15.0)
MCH: 27.6 pg (ref 26.0–34.0)
MCHC: 32.7 g/dL (ref 30.0–36.0)
MCV: 84.3 fL (ref 78.0–100.0)
Platelets: 327 10*3/uL (ref 150–400)
RBC: 4.46 MIL/uL (ref 3.87–5.11)
RDW: 13 % (ref 11.5–15.5)
WBC: 9.9 10*3/uL (ref 4.0–10.5)

## 2017-07-25 LAB — COMPREHENSIVE METABOLIC PANEL
ALT: 12 U/L — ABNORMAL LOW (ref 14–54)
AST: 17 U/L (ref 15–41)
Albumin: 3.9 g/dL (ref 3.5–5.0)
Alkaline Phosphatase: 65 U/L (ref 38–126)
Anion gap: 8 (ref 5–15)
BUN: 14 mg/dL (ref 6–20)
CO2: 24 mmol/L (ref 22–32)
Calcium: 9.4 mg/dL (ref 8.9–10.3)
Chloride: 107 mmol/L (ref 101–111)
Creatinine, Ser: 0.7 mg/dL (ref 0.44–1.00)
GFR calc Af Amer: >60 mL/min (ref >60)
GFR calc non Af Amer: >60 mL/min (ref >60)
Glucose, Bld: 96 mg/dL (ref 65–99)
Potassium: 3.6 mmol/L (ref 3.5–5.1)
Sodium: 139 mmol/L (ref 135–145)
Total Bilirubin: 0.6 mg/dL (ref 0.3–1.2)
Total Protein: 7.1 g/dL (ref 6.5–8.1)

## 2017-07-25 LAB — URINALYSIS, ROUTINE W REFLEX MICROSCOPIC
Bilirubin Urine: NEGATIVE
Glucose, UA: NEGATIVE mg/dL
Hgb urine dipstick: NEGATIVE
Ketones, ur: NEGATIVE mg/dL
Leukocytes, UA: NEGATIVE
Nitrite: NEGATIVE
Protein, ur: NEGATIVE mg/dL
Specific Gravity, Urine: 1.025 (ref 1.005–1.030)
pH: 5 (ref 5.0–8.0)

## 2017-07-25 LAB — I-STAT BETA HCG BLOOD, ED (MC, WL, AP ONLY): I-stat hCG, quantitative: <5 m[IU]/mL (ref <5)

## 2017-07-25 LAB — LIPASE, BLOOD: Lipase: 30 U/L (ref 11–51)

## 2017-07-25 NOTE — ED Notes (Signed)
Refused abdominal xray

## 2017-07-25 NOTE — ED Provider Notes (Signed)
[facility] EMERGENCY DEPARTMENT Provider Note   CSN: [id number] Arrival date & time: 07/24/17  2356     History   Chief Complaint Chief Complaint  Patient presents with  . Abdominal Pain   HPI   Blood pressure 120/76, pulse 73, temperature 99.7 F (37.6 C), temperature source Oral, resp. rate 16, last menstrual period 07/11/2017, SpO2 100 %.  Kelly Benson is a 22 y.o. female complaining of right-sided abdominal pain which she has had intermittently over the course of the last month, there is no associated nausea or vomiting, she does have diarrhea.  She denies fevers, chills, melena, hematochezia, recent antibiotic use in the last 3 months, vaginal discharge, dysuria, hematuria or urinary frequency.  No pain medication is taken prior to arrival.  She rates her pain at 2 out of 10, the pain is not postprandial.  Pain is not worsening, she decided to come to the emergency department because she is "tired of it."  Patient also feels that she has a white coating on her tongue onset approximately a month ago as well.  Patient is reporting productive cough.  She denies any pleuritic chest pain, fever.  Past Medical History:  Diagnosis Date  . MS (multiple sclerosis) (HCC)   . Scoliosis     Patient Active Problem List   Diagnosis Date Noted  . Multiple sclerosis exacerbation (HCC) 06/11/2016  . Does not have health insurance 06/11/2016  . Vaginal discharge 09/14/2015  . Multiple sclerosis (HCC)   . Dizziness   . Demyelination, corpus callosum central (HCC)   . MS (multiple sclerosis) (HCC) 09/02/2015    History reviewed. No pertinent surgical history.  OB History    No data available       Home Medications    Prior to Admission medications   Medication Sig Start Date End Date Taking? Authorizing Provider  magic mouthwash w/lidocaine SOLN Take 10 mLs by mouth every 2 (two) hours as needed for mouth pain. 03/11/17   Bing Neighbors, FNP     Family History Family History  Problem Relation Age of Onset  . Hypertension Father     Social History Social History   Tobacco Use  . Smoking status: Former Smoker    Types: Cigarettes  . Smokeless tobacco: Never Used  Substance Use Topics  . Alcohol use: No  . Drug use: No     Allergies   Patient has no known allergies.   Review of Systems Review of Systems  A complete review of systems was obtained and all systems are negative except as noted in the HPI and PMH.   Physical Exam Updated Vital Signs BP 120/76   Pulse 73   Temp 99.7 F (37.6 C) (Oral)   Resp 16   LMP 07/11/2017   SpO2 100%   Physical Exam  Constitutional: She is oriented to person, place, and time. She appears well-developed and well-nourished. No distress.  HENT:  Head: Normocephalic and atraumatic.  Right Ear: External ear normal.  Left Ear: External ear normal.  Mouth/Throat: Oropharynx is clear and moist. No oropharyngeal exudate.  No drooling or stridor. Posterior pharynx mildly erythematous no significant tonsillar hypertrophy. No exudate. Soft palate rises symmetrically. No TTP or induration under tongue.   No tenderness to palpation of frontal or bilateral maxillary sinuses.  Mild mucosal edema in the nares with scant rhinorrhea.  Bilateral tympanic membranes with normal architecture and good light reflex.    Eyes: Conjunctivae and EOM are normal. Pupils  are equal, round, and reactive to light.  Neck: Normal range of motion. Neck supple.  Cardiovascular: Normal rate, regular rhythm and intact distal pulses.  Pulmonary/Chest: Effort normal and breath sounds normal. No stridor. No respiratory distress. She has no wheezes. She has no rales. She exhibits no tenderness.  Abdominal: Soft. There is no tenderness. There is no rebound and no guarding.  Musculoskeletal: Normal range of motion.  Neurological: She is alert and oriented to person, place, and time.  Skin: She is not  diaphoretic.  Psychiatric: She has a normal mood and affect.  Nursing note and vitals reviewed.    ED Treatments / Results  Labs (all labs ordered are listed, but only abnormal results are displayed) Labs Reviewed  COMPREHENSIVE METABOLIC PANEL - Abnormal; Notable for the following components:      Result Value   ALT 12 (*)    All other components within normal limits  LIPASE, BLOOD  CBC  URINALYSIS, ROUTINE W REFLEX MICROSCOPIC  I-STAT BETA HCG BLOOD, ED ([facility], [facility], [facility] ONLY)    EKG  EKG Interpretation None       Radiology No results found.  Procedures Procedures (including critical care time)  Medications Ordered in ED Medications - No data to display   Initial Impression / Assessment and Plan / ED Course  I have reviewed the triage vital signs and the nursing notes.  Pertinent labs & imaging results that were available during my care of the patient were reviewed by me and considered in my medical decision making (see chart for details).     Vitals:   07/25/17 0002 07/25/17 0541 07/25/17 0846  BP: 127/73 125/74 120/76  Pulse: 89 73   Resp: 18 16   Temp: 99.7 F (37.6 C)    TempSrc: Oral    SpO2: 98% 100%      Kelly Benson is 22 y.o. female presenting with right-sided abdominal pain intermittently over the course of the last month.  She states it is not postprandial.  Afebrile, tolerating p.o.'s.  Abdominal exam with no tenderness.  She is reporting diarrhea, I doubt this is C. difficile given the lack of recent antibiotics and normal blood work.  Blood work reassuring with no significant abnormalities.  I doubt this is gallbladder/appendicitis.  Possibility that it might be a referred pain given her cough, will obtain acute abdominal series to look for a possible pneumonia.  Patient's pain is 2 out of 10, no pain medication indicated at this time.  Patient states that she does not want to stay for the x-ray, I do not feel that it is high yield and that we  will find in any significant abnormalities, patient is discharged home and advised to follow closely with PCP, recommend ibuprofen for abdominal discomfort.   Evaluation does not show pathology that would require ongoing emergent intervention or inpatient treatment. Pt is hemodynamically stable and mentating appropriately. Discussed findings and plan with patient/guardian, who agrees with care plan. All questions answered. Return precautions discussed and outpatient follow up given.    Final Clinical Impressions(s) / ED Diagnoses   Final diagnoses:  Abdominal pain, unspecified abdominal location    ED Discharge Orders    None       Lynetta Mare Mardella Layman 07/25/17 0920    Rolland Porter, MD 07/27/17 [id number]

## 2017-07-25 NOTE — ED Notes (Signed)
Pt left without d/c papers

## 2017-07-25 NOTE — Discharge Instructions (Signed)
For pain control please take Ibuprofen (also known as Motrin or Advil) 400mg  (this is normally 2 over the counter pills) every 6 hours. Take with food to minimize stomach irritation.  Return to the emergency room for severely worsening abdominal pain, abdominal pain that localizes to a particular area (especially the right lower part of the belly), pain that persists past 8-10 hours, blood in stool or vomit, severe weakness, fainting, or fever.   Maintain hydration by drinking small amounts of clear fluids frequently, then soft diet, and then advance to a solid diet as tolerated. Avoid foods that are spicy, high in fat or dairy.  Please follow with your primary care doctor in the next 2 days for a check-up. They must obtain records for further management.   Do not hesitate to return to the Emergency Department for any new, worsening or concerning symptoms.

## 2017-07-25 NOTE — ED Triage Notes (Signed)
Patient reports chronic intermittent right abdominal pain for > 1 month with nausea and inttermitent diarrhea , denies fever or chills .

## 2017-09-09 ENCOUNTER — Ambulatory Visit: Payer: Self-pay | Admitting: Family Medicine

## 2018-05-10 ENCOUNTER — Emergency Department (HOSPITAL_COMMUNITY): Payer: Self-pay

## 2018-05-10 ENCOUNTER — Emergency Department (HOSPITAL_COMMUNITY)
Admission: EM | Admit: 2018-05-10 | Discharge: 2018-05-10 | Disposition: A | Discharge status: Home / Self Care | Payer: Self-pay | Attending: Emergency Medicine | Admitting: Emergency Medicine

## 2018-05-10 ENCOUNTER — Encounter (HOSPITAL_COMMUNITY): Payer: Self-pay | Admitting: Emergency Medicine

## 2018-05-10 DIAGNOSIS — R140 Abdominal distension (gaseous): Secondary | ICD-10-CM | POA: Insufficient documentation

## 2018-05-10 DIAGNOSIS — G35 Multiple sclerosis: Secondary | ICD-10-CM | POA: Insufficient documentation

## 2018-05-10 DIAGNOSIS — J40 Bronchitis, not specified as acute or chronic: Secondary | ICD-10-CM | POA: Insufficient documentation

## 2018-05-10 DIAGNOSIS — Z87891 Personal history of nicotine dependence: Secondary | ICD-10-CM | POA: Insufficient documentation

## 2018-05-10 DIAGNOSIS — R1084 Generalized abdominal pain: Secondary | ICD-10-CM

## 2018-05-10 LAB — CBC WITH DIFFERENTIAL/PLATELET
Abs Immature Granulocytes: 0.01 10*3/uL (ref 0.00–0.07)
Basophils Absolute: 0 10*3/uL (ref 0.0–0.1)
Basophils Relative: 0 %
Eosinophils Absolute: 0 10*3/uL (ref 0.0–0.5)
Eosinophils Relative: 1 %
HCT: 40 % (ref 36.0–46.0)
Hemoglobin: 12.3 g/dL (ref 12.0–15.0)
Immature Granulocytes: 0 %
Lymphocytes Relative: 24 %
Lymphs Abs: 1.3 10*3/uL (ref 0.7–4.0)
MCH: 26.1 pg (ref 26.0–34.0)
MCHC: 30.8 g/dL (ref 30.0–36.0)
MCV: 84.7 fL (ref 80.0–100.0)
Monocytes Absolute: 0.4 10*3/uL (ref 0.1–1.0)
Monocytes Relative: 8 %
Neutro Abs: 3.8 10*3/uL (ref 1.7–7.7)
Neutrophils Relative %: 67 %
Platelets: 291 10*3/uL (ref 150–400)
RBC: 4.72 MIL/uL (ref 3.87–5.11)
RDW: 13.5 % (ref 11.5–15.5)
WBC: 5.7 10*3/uL (ref 4.0–10.5)
nRBC: 0 % (ref 0.0–0.2)

## 2018-05-10 LAB — COMPREHENSIVE METABOLIC PANEL
ALT: 15 U/L (ref 0–44)
AST: 15 U/L (ref 15–41)
Albumin: 3.7 g/dL (ref 3.5–5.0)
Alkaline Phosphatase: 50 U/L (ref 38–126)
Anion gap: 9 (ref 5–15)
BUN: 13 mg/dL (ref 6–20)
CO2: 25 mmol/L (ref 22–32)
Calcium: 8.8 mg/dL — ABNORMAL LOW (ref 8.9–10.3)
Chloride: 106 mmol/L (ref 98–111)
Creatinine, Ser: 0.61 mg/dL (ref 0.44–1.00)
GFR calc Af Amer: >60 mL/min (ref >60)
GFR calc non Af Amer: >60 mL/min (ref >60)
Glucose, Bld: 88 mg/dL (ref 70–99)
Potassium: 3.5 mmol/L (ref 3.5–5.1)
Sodium: 140 mmol/L (ref 135–145)
Total Bilirubin: 0.7 mg/dL (ref 0.3–1.2)
Total Protein: 7 g/dL (ref 6.5–8.1)

## 2018-05-10 LAB — URINALYSIS, ROUTINE W REFLEX MICROSCOPIC
Bilirubin Urine: NEGATIVE
Glucose, UA: NEGATIVE mg/dL
Hgb urine dipstick: NEGATIVE
Ketones, ur: NEGATIVE mg/dL
Leukocytes, UA: NEGATIVE
Nitrite: NEGATIVE
Protein, ur: NEGATIVE mg/dL
Specific Gravity, Urine: 1.027 (ref 1.005–1.030)
pH: 6 (ref 5.0–8.0)

## 2018-05-10 LAB — LIPASE, BLOOD: Lipase: 24 U/L (ref 11–51)

## 2018-05-10 LAB — I-STAT BETA HCG BLOOD, ED (MC, WL, AP ONLY): I-stat hCG, quantitative: <5 m[IU]/mL (ref <5)

## 2018-05-10 MED ORDER — ALBUTEROL SULFATE HFA 108 (90 BASE) MCG/ACT IN AERS
2.0000 | INHALATION_SPRAY | Freq: Once | RESPIRATORY_TRACT | Status: AC
  Administered 2018-05-10: 2 via RESPIRATORY_TRACT
  Filled 2018-05-10: qty 6.7

## 2018-05-10 MED ORDER — ALBUTEROL SULFATE HFA 108 (90 BASE) MCG/ACT IN AERS: 1.0000 | INHALATION_SPRAY | Freq: Four times a day (QID) | RESPIRATORY_TRACT | 0 refills | Status: DC | PRN

## 2018-05-10 MED ORDER — IOHEXOL 300 MG/ML  SOLN
100.0000 mL | Freq: Once | INTRAMUSCULAR | Status: AC | PRN
  Administered 2018-05-10: 100 mL via INTRAVENOUS

## 2018-05-10 MED ORDER — ALUM & MAG HYDROXIDE-SIMETH 200-200-20 MG/5ML PO SUSP
30.0000 mL | Freq: Once | ORAL | Status: AC
  Administered 2018-05-10: 30 mL via ORAL
  Filled 2018-05-10: qty 30

## 2018-05-10 NOTE — Discharge Instructions (Addendum)
I think that your chest pain shortness of breath is likely related to bronchitis.  He did not have any evidence of blood clot, heart attack, pneumonia or other emergent symptoms at this time.  Please use the inhaler as directed for shortness of breath, cough.  Please follow-up with your primary doctor to ensure this is improving in a few days and further work-up. Your CT scan and labs were negative for any upper abdominal swelling processes.  Your pancreas, gallbladder, liver, spleen, colon and stomach appeared to be normal on labs and imaging.  I do not know the cause of the symptoms at this time you need to continue following up with your primary doctor to evaluate them further.

## 2018-05-10 NOTE — ED Triage Notes (Signed)
Pt here for eval of 2-3 weeks of URI, cough, chest pain/upper abdominal pain. States she has had upper abdominal swelling for 5 days. LMP 16th with some irregular bleeding X 1 since her period. Denies SOB N V D.

## 2018-05-10 NOTE — ED Provider Notes (Signed)
Emergency Department Provider Note   I have reviewed the triage vital signs and the nursing notes.   HISTORY  Chief Complaint Abdominal Pain and Chest Pain   HPI Kelly Benson is a 22 y.o. female presents the emergency department today with multiple complaints.  Patient states that her main concern is having some pleuritic chest pain or shortness of breath is been going on for the last couple weeks.  She is also had some abdominal swelling during that time as well.  She states that every day it seems to be getting bigger in her upper abdominal area only.  States she never adenitis before.  She does have a history of multiple sclerosis but those are different symptoms.  Patient states she had increased fatigue over the last couple weeks as well. No other associated or modifying symptoms.    Past Medical History:  Diagnosis Date  . MS (multiple sclerosis) (HCC)   . Scoliosis     Patient Active Problem List   Diagnosis Date Noted  . Multiple sclerosis exacerbation (HCC) 06/11/2016  . Does not have health insurance 06/11/2016  . Vaginal discharge 09/14/2015  . Multiple sclerosis (HCC)   . Dizziness   . Demyelination, corpus callosum central (HCC)   . MS (multiple sclerosis) (HCC) 09/02/2015    History reviewed. No pertinent surgical history.  Current Outpatient Rx  . Order #: [id number] Class: Historical Med  . Order #: [id number] Class: Print  . Order #: [id number] Class: Print    Allergies Patient has no known allergies.  Family History  Problem Relation Age of Onset  . Hypertension Father     Social History Social History   Tobacco Use  . Smoking status: Former Smoker    Types: Cigarettes  . Smokeless tobacco: Never Used  Substance Use Topics  . Alcohol use: No  . Drug use: No    Review of Systems  All other systems negative except as documented in the HPI. All pertinent positives and negatives as reviewed in the  HPI. ____________________________________________   PHYSICAL EXAM:  VITAL SIGNS: ED Triage Vitals [05/10/18 0956]  Enc Vitals Group     BP 124/72     Pulse Rate 75     Resp 18     Temp 98.7 F (37.1 C)     Temp Source Oral     SpO2 100 %    Constitutional: Alert and oriented. Well appearing and in no acute distress. Eyes: Conjunctivae are normal. PERRL. EOMI. Head: Atraumatic. Nose: No congestion/rhinnorhea. Mouth/Throat: Mucous membranes are moist.  Oropharynx non-erythematous. Neck: No stridor.  No meningeal signs.   Cardiovascular: Normal rate, regular rhythm. Good peripheral circulation. Grossly normal heart sounds.   Respiratory: Normal respiratory effort.  No retractions. Lungs CTAB. Gastrointestinal: Soft with mild upper tenderness. Musculoskeletal: No lower extremity tenderness nor edema. No gross deformities of extremities. Neurologic:  Normal speech and language. No gross focal neurologic deficits are appreciated.  Skin:  Skin is warm, dry and intact. No rash noted.   ____________________________________________   LABS (all labs ordered are listed, but only abnormal results are displayed)  Labs Reviewed  COMPREHENSIVE METABOLIC PANEL - Abnormal; Notable for the following components:      Result Value   Calcium 8.8 (*)    All other components within normal limits  URINALYSIS, ROUTINE W REFLEX MICROSCOPIC - Abnormal; Notable for the following components:   APPearance HAZY (*)    All other components within normal limits  LIPASE, BLOOD  CBC WITH DIFFERENTIAL/PLATELET  I-STAT BETA HCG BLOOD, ED ([facility], [facility], [facility] ONLY)   ____________________________________________  EKG   EKG Interpretation  Date/Time:  Sunday May 10 2018 10:00:10 EST Ventricular Rate:  75 PR Interval:    QRS Duration: 71 QT Interval:  350 QTC Calculation: 391 R Axis:   72 Text Interpretation:  Sinus rhythm No significant change since last tracing Confirmed by Marily MemosMesner, Shaquoya Cosper [phone]([id number]) on  05/10/2018 11:28:53 AM       ____________________________________________  RADIOLOGY  Dg Chest 2 View  Result Date: 05/10/2018 CLINICAL DATA:  Chest pain and shortness of breath. Cough. EXAM: CHEST - 2 VIEW COMPARISON:  09/04/2015 FINDINGS: The heart size and mediastinal contours are within normal limits. Both lungs are clear except for peribronchial thickening visible on the lateral view. No effusions. The visualized skeletal structures are unremarkable. IMPRESSION: Bronchitic changes. Electronically Signed   By: Francene BoyersJames  Maxwell M.D.   On: 05/10/2018 11:20   Ct Abdomen Pelvis W Contrast  Result Date: 05/10/2018 CLINICAL DATA:  Epigastric pain and abdominal distension. EXAM: CT ABDOMEN AND PELVIS WITH CONTRAST TECHNIQUE: Multidetector CT imaging of the abdomen and pelvis was performed using the standard protocol following bolus administration of intravenous contrast. CONTRAST:  100mL OMNIPAQUE IOHEXOL 300 MG/ML  SOLN COMPARISON:  CT abdomen pelvis 01/15/2015 FINDINGS: Lower chest: Normal heart size. Dependent atelectasis within the bilateral lower lobes. No pleural effusion. Hepatobiliary: Liver is normal in size and contour. No focal hepatic lesion identified. Gallbladder is unremarkable. No intrahepatic or extrahepatic biliary ductal dilatation. Pancreas: Unremarkable Spleen: Unremarkable Adrenals/Urinary Tract: Normal adrenal glands. Kidneys enhance symmetrically with contrast. No hydronephrosis. Urinary bladder is unremarkable. Stomach/Bowel: No abnormal bowel wall thickening or evidence for bowel obstruction. Normal appendix. Trace free fluid in the pelvis. Normal morphology of the stomach. Vascular/Lymphatic: Normal caliber abdominal aorta. No retroperitoneal lymphadenopathy. Reproductive: Uterus and adnexal structures are unremarkable. Other: None. Musculoskeletal: No aggressive or acute appearing osseous lesions. IMPRESSION: No acute process within the abdomen or pelvis. Small amount of fluid  about the right adnexa potentially secondary to ruptured right ovarian cyst. Electronically Signed   By: Annia Beltrew  Davis M.D.   On: 05/10/2018 12:35    ____________________________________________   PROCEDURES  Procedure(s) performed:   Procedures   ____________________________________________   INITIAL IMPRESSION / ASSESSMENT AND PLAN / ED COURSE  Unclear etiology for symptoms.  Will evaluate as appropriate.  Low suspicion for ACS, PE without any risk factors or abnormal vital signs.  Will get EKG chest x-ray to evaluate chest pain shortness of breath.  Upper abdominal swelling/distention is not significantly prominent but is a large concern for the patient so we will get a CT scan and basic labs to evaluate that.  I discussed with her I doubt that I did find the cause of her symptoms today but would try to rule out life-threatening emergencies as well as possible.   Pertinent labs & imaging results that were available during my care of the patient were reviewed by me and considered in my medical decision making (see chart for details).  ____________________________________________  FINAL CLINICAL IMPRESSION(S) / ED DIAGNOSES  Final diagnoses:  Bronchitis  Generalized abdominal pain     MEDICATIONS GIVEN DURING THIS VISIT:  Medications  alum & mag hydroxide-simeth (MAALOX/MYLANTA) 200-200-20 MG/5ML suspension 30 mL (30 mLs Oral Given 05/10/18 1032)  iohexol (OMNIPAQUE) 300 MG/ML solution 100 mL (100 mLs Intravenous Contrast Given 05/10/18 1205)  albuterol (PROVENTIL HFA;VENTOLIN HFA) 108 (90 Base) MCG/ACT inhaler 2 puff (2 puffs Inhalation Given 05/10/18 1301)  NEW OUTPATIENT MEDICATIONS STARTED DURING THIS VISIT:  New Prescriptions   ALBUTEROL (PROVENTIL HFA;VENTOLIN HFA) 108 (90 BASE) MCG/ACT INHALER    Inhale 1-2 puffs into the lungs every 6 (six) hours as needed for wheezing or shortness of breath.    Note:  This note was prepared with assistance of Dragon voice  recognition software. Occasional wrong-word or sound-a-like substitutions may have occurred due to the inherent limitations of voice recognition software.   Marily Memos, MD 05/10/18 [phone]

## 2018-10-14 ENCOUNTER — Observation Stay (HOSPITAL_COMMUNITY)
Admission: EM | Admit: 2018-10-14 | Discharge: 2018-10-15 | Disposition: A | Discharge status: Home / Self Care | Payer: Self-pay | Attending: Internal Medicine | Admitting: Internal Medicine

## 2018-10-14 ENCOUNTER — Other Ambulatory Visit: Payer: Self-pay

## 2018-10-14 ENCOUNTER — Emergency Department (HOSPITAL_COMMUNITY): Payer: Self-pay

## 2018-10-14 ENCOUNTER — Encounter (HOSPITAL_COMMUNITY): Payer: Self-pay | Admitting: Emergency Medicine

## 2018-10-14 DIAGNOSIS — M79671 Pain in right foot: Secondary | ICD-10-CM | POA: Insufficient documentation

## 2018-10-14 DIAGNOSIS — Z87891 Personal history of nicotine dependence: Secondary | ICD-10-CM | POA: Insufficient documentation

## 2018-10-14 DIAGNOSIS — M79672 Pain in left foot: Secondary | ICD-10-CM | POA: Insufficient documentation

## 2018-10-14 DIAGNOSIS — G35 Multiple sclerosis: Secondary | ICD-10-CM | POA: Diagnosis present

## 2018-10-14 DIAGNOSIS — Z20828 Contact with and (suspected) exposure to other viral communicable diseases: Secondary | ICD-10-CM | POA: Insufficient documentation

## 2018-10-14 DIAGNOSIS — R079 Chest pain, unspecified: Secondary | ICD-10-CM | POA: Insufficient documentation

## 2018-10-14 DIAGNOSIS — R0603 Acute respiratory distress: Principal | ICD-10-CM | POA: Insufficient documentation

## 2018-10-14 LAB — CBC
HCT: 40.8 % (ref 36.0–46.0)
Hemoglobin: 13.3 g/dL (ref 12.0–15.0)
MCH: 27.6 pg (ref 26.0–34.0)
MCHC: 32.6 g/dL (ref 30.0–36.0)
MCV: 84.6 fL (ref 80.0–100.0)
Platelets: 303 10*3/uL (ref 150–400)
RBC: 4.82 MIL/uL (ref 3.87–5.11)
RDW: 13.1 % (ref 11.5–15.5)
WBC: 7.9 10*3/uL (ref 4.0–10.5)
nRBC: 0 % (ref 0.0–0.2)

## 2018-10-14 LAB — I-STAT BETA HCG BLOOD, ED (MC, WL, AP ONLY): I-stat hCG, quantitative: <5 m[IU]/mL (ref <5)

## 2018-10-14 LAB — BASIC METABOLIC PANEL
Anion gap: 12 (ref 5–15)
BUN: 12 mg/dL (ref 6–20)
CO2: 20 mmol/L — ABNORMAL LOW (ref 22–32)
Calcium: 9.1 mg/dL (ref 8.9–10.3)
Chloride: 106 mmol/L (ref 98–111)
Creatinine, Ser: 0.7 mg/dL (ref 0.44–1.00)
GFR calc Af Amer: >60 mL/min (ref >60)
GFR calc non Af Amer: >60 mL/min (ref >60)
Glucose, Bld: 105 mg/dL — ABNORMAL HIGH (ref 70–99)
Potassium: 3.2 mmol/L — ABNORMAL LOW (ref 3.5–5.1)
Sodium: 138 mmol/L (ref 135–145)

## 2018-10-14 LAB — TROPONIN I: Troponin I: <0.03 ng/mL (ref <0.03)

## 2018-10-14 LAB — D-DIMER, QUANTITATIVE: D-Dimer, Quant: 0.35 ug/mL-FEU (ref 0.00–0.50)

## 2018-10-14 MED ORDER — SODIUM CHLORIDE 0.9% FLUSH
3.0000 mL | Freq: Once | INTRAVENOUS | Status: AC
  Administered 2018-10-14: 3 mL via INTRAVENOUS

## 2018-10-14 NOTE — ED Provider Notes (Addendum)
[facility] EMERGENCY DEPARTMENT Provider Note   CSN: [id number] Arrival date & time: 10/14/18  1528    History   Chief Complaint Chief Complaint  Patient presents with  . Chest Pain  . Shortness of Breath    HPI Kelly Benson is a 23 y.o. female.     The history is provided by the patient. No language interpreter was used.  Chest Pain  Pain location:  Unable to specify Pain quality: aching   Pain radiates to:  Does not radiate Pain severity:  Mild Onset quality:  Gradual Progression:  Worsening Chronicity:  Recurrent Relieved by:  Nothing Worsened by:  Nothing Ineffective treatments:  None tried Associated symptoms: shortness of breath   Risk factors comment:  MS Shortness of Breath  Associated symptoms: chest pain    Pt reports she was diagnosed with MS in 2017.  Pt has known brain cervical and thoracic involvement.  Pt reports in January she began having shortness of breath.  Pt reports she feels if she closes her eyes to fall asleep she stops breathing.  Pt reports she also has discomfort in her feet.  Pt does not have a primary care MD or a neurologist.  Pt reports she has been unable to afford.  Past Medical History:  Diagnosis Date  . MS (multiple sclerosis) (HCC)   . Scoliosis     Patient Active Problem List   Diagnosis Date Noted  . Multiple sclerosis exacerbation (HCC) 06/11/2016  . Does not have health insurance 06/11/2016  . Vaginal discharge 09/14/2015  . Multiple sclerosis (HCC)   . Dizziness   . Demyelination, corpus callosum central (HCC)   . MS (multiple sclerosis) (HCC) 09/02/2015    History reviewed. No pertinent surgical history.   OB History   No obstetric history on file.      Home Medications    Prior to Admission medications   Medication Sig Start Date End Date Taking? Authorizing Provider  albuterol (PROVENTIL HFA;VENTOLIN HFA) 108 (90 Base) MCG/ACT inhaler Inhale 1-2 puffs into the lungs every 6 (six)  hours as needed for wheezing or shortness of breath. 05/10/18   Mesner, Barbara Cower, MD  ibuprofen (ADVIL,MOTRIN) 200 MG tablet Take 200 mg by mouth every 6 (six) hours as needed for headache or cramping.    [provider]  magic mouthwash w/lidocaine SOLN Take 10 mLs by mouth every 2 (two) hours as needed for mouth pain. 03/11/17   Bing Neighbors, FNP    Family History Family History  Problem Relation Age of Onset  . Hypertension Father     Social History Social History   Tobacco Use  . Smoking status: Former Smoker    Types: Cigarettes  . Smokeless tobacco: Never Used  Substance Use Topics  . Alcohol use: No  . Drug use: No     Allergies   Patient has no known allergies.   Review of Systems Review of Systems  Respiratory: Positive for shortness of breath.   Cardiovascular: Positive for chest pain.  Musculoskeletal: Positive for arthralgias.  All other systems reviewed and are negative.    Physical Exam Updated Vital Signs BP 120/65   Pulse 82   Temp 99.2 F (37.3 C) (Oral)   Resp 14   Ht  (1.626 m)   Wt 83.9 kg   LMP 09/27/2018   SpO2 100%   BMI 31.76 kg/m   Physical Exam Vitals signs and nursing note reviewed.  Constitutional:  Appearance: She is well-developed.  HENT:     Head: Normocephalic.  Eyes:     Pupils: Pupils are equal, round, and reactive to light.  Neck:     Musculoskeletal: Normal range of motion.  Cardiovascular:     Rate and Rhythm: Normal rate.     Heart sounds: Normal heart sounds.  Pulmonary:     Effort: Pulmonary effort is normal.     Breath sounds: Normal breath sounds.  Abdominal:     General: There is no distension.  Musculoskeletal: Normal range of motion.  Skin:    General: Skin is warm.  Neurological:     General: No focal deficit present.     Mental Status: She is alert and oriented to person, place, and time.  Psychiatric:        Mood and Affect: Mood normal.      ED Treatments / Results   Labs (all labs ordered are listed, but only abnormal results are displayed) Labs Reviewed  BASIC METABOLIC PANEL - Abnormal; Notable for the following components:      Result Value   Potassium 3.2 (*)    CO2 20 (*)    Glucose, Bld 105 (*)    All other components within normal limits  CBC  TROPONIN I  D-DIMER, QUANTITATIVE (NOT AT [facility])  I-STAT BETA HCG BLOOD, ED ([facility], [facility], [facility] ONLY)    EKG EKG Interpretation  Date/Time:  Wednesday October 14 2018 15:35:48 EDT Ventricular Rate:  106 PR Interval:  140 QRS Duration: 78 QT Interval:  312 QTC Calculation: 414 R Axis:   77 Text Interpretation:  Sinus tachycardia Nonspecific T wave abnormality Abnormal ECG Confirmed by Pricilla Loveless [phone]) on 10/14/2018 6:55:39 PM   Radiology Dg Chest 2 View  Result Date: 10/14/2018 CLINICAL DATA:  Chest pain and shortness of breath EXAM: CHEST - 2 VIEW COMPARISON:  05/10/2018 FINDINGS: The heart size and mediastinal contours are within normal limits. Both lungs are clear. The visualized skeletal structures are unremarkable. IMPRESSION: Normal exam. Electronically Signed   By: Francene Boyers M.D.   On: 10/14/2018 16:05    Procedures Procedures (including critical care time)  Medications Ordered in ED Medications  sodium chloride flush (NS) 0.9 % injection 3 mL (3 mLs Intravenous Given 10/14/18 1913)     Initial Impression / Assessment and Plan / ED Course  I have reviewed the triage vital signs and the nursing notes.  Pertinent labs & imaging results that were available during my care of the patient were reviewed by me and considered in my medical decision making (see chart for details).        MDM   Chest xray is normal, EKG is normal DDIMer and Troponin is normal. I reviewed UP to date literature on MS.  (Symptoms are consistent with MS Hugs and MS sleep apnea)  I suspect feet discomfort is also from MS.    Neurology consulted I spoke to Dr. Wilford Corner.  I suspect pt's symptoms are from MS.  He  advised obtain a MRi of brain and c spine with and without.  Pt needs primary care and neurology Case mangement consulted Pt's care turned over to Elpidio Anis PA-C MRI pending Final Clinical Impressions(s) / ED Diagnoses   Final diagnoses:  MS (multiple sclerosis) (HCC)  Acute respiratory distress    ED Discharge Orders    None       Elson Areas, Cordelia Poche 10/14/18 2339    Elson Areas, [place] 10/14/18 2343  Pricilla Loveless, MD 10/22/18 [phone]

## 2018-10-14 NOTE — ED Triage Notes (Signed)
Pt c/o chest pain and shortness of breath x 2 days, also reports weight gain in her abdomen. LMP- 09/2018

## 2018-10-14 NOTE — ED Notes (Signed)
Patient transported to MRI 

## 2018-10-14 NOTE — Care Management (Signed)
ED CM met with patient to assist with follow up care for PCP for MS symptom management. Discussed the [facility] and services provided. CM offered to forward her information to the clinic's CM to contact patient to schedule an appointment, patient is agreeable. CM verified contact information. Updated K. Sophia PA-C.

## 2018-10-14 NOTE — ED Notes (Signed)
Hx of chronic SOB with intermittent episodes.  Last occurred and was January, states sx have not stopped since then.  Has been using an inhaler without relief and has continued dyspnea and believes if she relaxes her body will stop breathing.   Also c/o pain to bottoms of bilateral feet, with increased pain with amb in the AM.

## 2018-10-14 NOTE — ED Provider Notes (Signed)
H/O MS 2017/2018 lesions on brain cervical and thoracic spine Since January, chest tightness, SOB, painful feet HUGs syndrome - chest being squeezed, feels like they stop breathing at night Wilford Corner - needs MRI brain and C-spine w and w/o for likely disease progression If any new finding when compared to previous, admission would be necessary for IV steroids - Medicine  Has been seen by SW who has set up contacts for community medical care if discharged home.  1:25 - Discussed MRI findings with Dr. Wilford Corner who recommends admission to medicine for 5 days IV steroids.  He will see the patient in the ED. COVID pending. Hospitalist paged for admission.  1:35 - discussed the admission with Dr. Toniann Fail, [facility], who accepts the patient onto his service.    Elpidio Anis, PA-C 10/15/18 0143    Pricilla Loveless, MD 10/22/18 [phone]

## 2018-10-15 ENCOUNTER — Encounter (HOSPITAL_COMMUNITY): Payer: Self-pay | Admitting: Internal Medicine

## 2018-10-15 ENCOUNTER — Telehealth: Payer: Self-pay

## 2018-10-15 DIAGNOSIS — G35 Multiple sclerosis: Secondary | ICD-10-CM

## 2018-10-15 DIAGNOSIS — R0602 Shortness of breath: Secondary | ICD-10-CM

## 2018-10-15 LAB — CBC
HCT: 36.3 % (ref 36.0–46.0)
Hemoglobin: 12.3 g/dL (ref 12.0–15.0)
MCH: 28 pg (ref 26.0–34.0)
MCHC: 33.9 g/dL (ref 30.0–36.0)
MCV: 82.7 fL (ref 80.0–100.0)
Platelets: 259 10*3/uL (ref 150–400)
RBC: 4.39 MIL/uL (ref 3.87–5.11)
RDW: 13.1 % (ref 11.5–15.5)
WBC: 8 10*3/uL (ref 4.0–10.5)
nRBC: 0 % (ref 0.0–0.2)

## 2018-10-15 LAB — SARS CORONAVIRUS 2 BY RT PCR (HOSPITAL ORDER, PERFORMED IN ~~LOC~~ HOSPITAL LAB): SARS Coronavirus 2: NEGATIVE

## 2018-10-15 LAB — BASIC METABOLIC PANEL
Anion gap: 7 (ref 5–15)
BUN: 9 mg/dL (ref 6–20)
CO2: 22 mmol/L (ref 22–32)
Calcium: 8.7 mg/dL — ABNORMAL LOW (ref 8.9–10.3)
Chloride: 109 mmol/L (ref 98–111)
Creatinine, Ser: 0.61 mg/dL (ref 0.44–1.00)
GFR calc Af Amer: >60 mL/min (ref >60)
GFR calc non Af Amer: >60 mL/min (ref >60)
Glucose, Bld: 115 mg/dL — ABNORMAL HIGH (ref 70–99)
Potassium: 3.1 mmol/L — ABNORMAL LOW (ref 3.5–5.1)
Sodium: 138 mmol/L (ref 135–145)

## 2018-10-15 LAB — HIV ANTIBODY (ROUTINE TESTING W REFLEX): HIV Screen 4th Generation wRfx: NONREACTIVE

## 2018-10-15 MED ORDER — POTASSIUM CHLORIDE CRYS ER 20 MEQ PO TBCR: 40.0000 meq | EXTENDED_RELEASE_TABLET | ORAL | Status: DC

## 2018-10-15 MED ORDER — PANTOPRAZOLE SODIUM 40 MG PO TBEC
40.0000 mg | DELAYED_RELEASE_TABLET | Freq: Every day | ORAL | Status: DC
  Filled 2018-10-15: qty 1

## 2018-10-15 MED ORDER — OMEGA-3-ACID ETHYL ESTERS 1 G PO CAPS: 2.0000 g | ORAL_CAPSULE | Freq: Every day | ORAL | Status: DC

## 2018-10-15 MED ORDER — ONDANSETRON HCL 4 MG PO TABS: 4.0000 mg | ORAL_TABLET | Freq: Four times a day (QID) | ORAL | Status: DC | PRN

## 2018-10-15 MED ORDER — ALBUTEROL SULFATE (2.5 MG/3ML) 0.083% IN NEBU: 2.5000 mg | INHALATION_SOLUTION | Freq: Four times a day (QID) | RESPIRATORY_TRACT | Status: DC | PRN

## 2018-10-15 MED ORDER — SODIUM CHLORIDE 0.9 % IV SOLN
1000.0000 mg | Freq: Every day | INTRAVENOUS | Status: DC
  Administered 2018-10-15: 1000 mg via INTRAVENOUS
  Filled 2018-10-15 (×2): qty 8

## 2018-10-15 MED ORDER — ENOXAPARIN SODIUM 40 MG/0.4ML ~~LOC~~ SOLN: 40.0000 mg | Freq: Every day | SUBCUTANEOUS | Status: DC

## 2018-10-15 MED ORDER — FOLIC ACID 1 MG PO TABS: 1.0000 mg | ORAL_TABLET | Freq: Every day | ORAL | Status: DC

## 2018-10-15 MED ORDER — B COMPLEX-C PO TABS: 1.0000 | ORAL_TABLET | Freq: Every day | ORAL | Status: DC

## 2018-10-15 MED ORDER — GADOBUTROL 1 MMOL/ML IV SOLN
8.0000 mL | Freq: Once | INTRAVENOUS | Status: AC | PRN
  Administered 2018-10-15: 8 mL via INTRAVENOUS

## 2018-10-15 MED ORDER — ONDANSETRON HCL 4 MG/2ML IJ SOLN: 4.0000 mg | Freq: Four times a day (QID) | INTRAMUSCULAR | Status: DC | PRN

## 2018-10-15 MED ORDER — ACETAMINOPHEN 325 MG PO TABS: 650.0000 mg | ORAL_TABLET | Freq: Four times a day (QID) | ORAL | Status: DC | PRN

## 2018-10-15 MED ORDER — ACETAMINOPHEN 650 MG RE SUPP: 650.0000 mg | Freq: Four times a day (QID) | RECTAL | Status: DC | PRN

## 2018-10-15 NOTE — Telephone Encounter (Signed)
Message received from Michel Bickers, RN CM requesting a hospital follow up appointment for the patient at [facility].  Call placed to patient # [phone] and message left requesting a call back to this CM # [phone]

## 2018-10-15 NOTE — Consult Note (Addendum)
Neurology Consultation  Reason for Consult: MS exacerbation Referring Physician: Dr. Criss Alvine  CC: Shortness of breath, pain in both feet  History is obtained from: Patient, chart  HPI: Kelly Benson is a 23 y.o. female diagnosed with MS since 2017, on no disease modifying medication, being admitted twice for MS flareups and treated with high-dose steroids, presents to the emergency room with a few months history of shortness of breath which has been worsening over since January 2020.  She says that she was told that she might have sleep apnea and needs treatment for that but the symptoms have not improved.  She also reports difficulty walking and pain in her feet upon waking up, which has also worsened since January.  The reason she came in to the emergency room today was to get evaluated for this shortness of breath which was concerning given the current COVID-19 pandemic as well as the feeling that her right foot keeps turning and when she stands or tries to walk and makes walking difficult. 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  She does not have insurance and has not followed up with outpatient neurology ever since her diagnosis of multiple sclerosis. She reports shocklike sensation going down the spine on bending her neck which has been persistent for many months to years.  She also reports worsening of her symptoms of tingling and numbness and generally feeling bad in higher temperatures. No recent cough.  No fevers or chills.  Positive for the shortness of breath as described above which has been worsening over few months.  No chest pain.  No nausea vomiting.  Also reports that she has felt that she has developed a lazy eye over the past few years.  Does not report any current diplopia but says when she is not wearing her contact lenses, she at times has diplopia 2.   ROS: Review of systems performed and is negative except  noted in the HPI  Past Medical History:  Diagnosis Date  . MS (multiple sclerosis) (HCC)   . Scoliosis       Family History  Problem Relation Age of Onset  . Hypertension Father   There is no family history of multiple sclerosis in either the paternal or maternal side. 1 maternal second cousin has lupus but no other history of lupus or rheumatoid arthritis in the family as well no other autoimmune conditions that she knows of on either side of the family.   Social History:   reports that she has quit smoking. Her smoking use included cigarettes. She has never used smokeless tobacco. She reports that she does not drink alcohol or use drugs. She is a current Naval architect business at [employer].  Works in her own Engineer, agricultural business of cleaning.  Is married. Denies alcohol, tobacco or illicit drug use  Medications No current facility-administered medications for this encounter.   Current Outpatient Medications:  .  albuterol (PROVENTIL HFA;VENTOLIN HFA) 108 (90 Base) MCG/ACT inhaler, Inhale 1-2 puffs into the lungs every 6 (six) hours as needed for wheezing or shortness of breath., Disp: 1 Inhaler, Rfl: 0 .  ibuprofen (ADVIL,MOTRIN) 200 MG tablet, Take 200 mg by mouth every 6 (six) hours as needed for headache or cramping., Disp: , Rfl:  .  magic mouthwash w/lidocaine SOLN, Take 10 mLs by mouth every 2 (two) hours as needed for mouth pain., Disp: 360 mL, Rfl: 0  Exam: Current vital signs:  BP 112/84   Pulse 66   Temp 99.2 F (37.3 C) (Oral)   Resp 13   Ht 5\' 4"  (1.626 m)   Wt 83.9 kg   LMP 09/27/2018   SpO2 100%   BMI 31.76 kg/m  Vital signs in last 24 hours: Temp:  [99.2 F (37.3 C)] 99.2 F (37.3 C) (06/03 1534) Pulse Rate:  [66-100] 66 (06/04 0115) Resp:  [13-21] 13 (06/03 2100) BP: (112-139)/(65-87) 112/84 (06/04 0115) SpO2:  [100 %] 100 % (06/04 0115) Weight:  [83.9 kg] 83.9 kg (06/03 1900) GENERAL: Awake, alert in NAD HEENT: - Normocephalic and atraumatic, dry  mm, no LN++, no Thyromegally LUNGS - Clear to auscultation bilaterally with no wheezes CV - S1S2 RRR, no m/r/g, equal pulses bilaterally. ABDOMEN - Soft, nontender, nondistended with normoactive BS Ext: warm, well perfused, intact peripheral pulses, no edema  NEURO:  Mental Status: AA&Ox3  Language: speech is non-dysarthric.  Naming, repetition, fluency, and comprehension intact. Cranial Nerves: PERRL.  Subtle disconjugate eye movements with no diplopia at the end- broken smooth pursuit, no nystagmus,, visual fields full, no facial asymmetry, facial sensation intact, hearing intact, tongue/uvula/soft palate midline, normal sternocleidomastoid and trapezius muscle strength. No evidence of tongue atrophy or fibrillations Motor: Upper extremities 5/5 with no drift.  Lower extremities 5/5 with no drift with the exception of mild weakness 4+/5 on the right and left dorsiflexors.  Normal tone Tone: is normal and bulk is normal Sensation- Intact to light touch bilaterally, no extinction.  Somewhat reduced vibratory sense on the left toe compared to the right toes.  Normal sensation to vibration in the upper extremities. Coordination: FTN intact bilaterally Gait-normal DTRs: Mildly hyperreflexic in all 4 extremities.  Labs I have reviewed labs in epic and the results pertinent to this consultation are:  CBC    Component Value Date/Time   WBC 7.9 10/14/2018 1539   RBC 4.82 10/14/2018 1539   HGB 13.3 10/14/2018 1539   HCT 40.8 10/14/2018 1539   PLT 303 10/14/2018 1539   MCV 84.6 10/14/2018 1539   MCH 27.6 10/14/2018 1539   MCHC 32.6 10/14/2018 1539   RDW 13.1 10/14/2018 1539   LYMPHSABS 1.3 05/10/2018 1037   MONOABS 0.4 05/10/2018 1037   EOSABS 0.0 05/10/2018 1037   BASOSABS 0.0 05/10/2018 1037    CMP     Component Value Date/Time   NA 138 10/14/2018 1539   K 3.2 (L) 10/14/2018 1539   CL 106 10/14/2018 1539   CO2 20 (L) 10/14/2018 1539   GLUCOSE 105 (H) 10/14/2018 1539   BUN 12  10/14/2018 1539   CREATININE 0.70 10/14/2018 1539   CREATININE 0.63 03/11/2017 1557   CALCIUM 9.1 10/14/2018 1539   PROT 7.0 05/10/2018 1037   ALBUMIN 3.7 05/10/2018 1037   AST 15 05/10/2018 1037   ALT 15 05/10/2018 1037   ALKPHOS 50 05/10/2018 1037   BILITOT 0.7 05/10/2018 1037   GFRNONAA >60 10/14/2018 1539   GFRNONAA 126 03/11/2017 1557   GFRAA >60 10/14/2018 1539   GFRAA 147 03/11/2017 1557    Imaging I have reviewed the images obtained:  MRI examination of the brain with moderate T2 lesion load suggestive of demyelinating disease, with few enhancing lesions. MRI of the C-spine with increased T2 hyperintensity lesion load when compared to prior scan but no enhancement.  Assessment: 23 year old woman with a diagnosis of MS, not on disease modifying therapy, presenting to the emergency room for evaluation of leg pain, feet pain and weakness along with  shortness of breath that has been worsening since January, with MRI of the brain and C-spine showing progressive lesion load with enhancement with contrast in the brain indicating active demyelination. Likely exacerbation of multiple sclerosis. Also consider sending NMO antibodies, although the C-spine lesions are not longitudinally extensive.  Recommendations: Check urinalysis Check COVID-19 test Start on steroids if both negative-Solu-Medrol 1 g IV for 5 days. GI prophylaxis with PPI while on steroids. We will request the hospitalist team to adjust steroid administration timings such that hospitalization time can be minimized. Stressed the importance of outpatient follow-up and starting disease modifying therapy. Also recommended speaking with the outpatient neurologist prior to conceiving-patient reports that she and her husband have been trying to have a baby but have not been successful thus far.  Plan was discussed in detail with the ED provider  -- Milon DikesAshish Raynard Mapps, MD [facility] Pager: [phone] If 7pm to  7am, please call on call as listed on AMION.

## 2018-10-15 NOTE — Progress Notes (Signed)
Dr Caren Griffins met me in the hallway and said that 19 wanted to leave AMA. I went in and tried to explain the reasons not to leave. She said the plan wouldn't work anyway and we just wanted her money. AMA papaers signed.

## 2018-10-15 NOTE — ED Notes (Signed)
ED TO INPATIENT HANDOFF REPORT  ED Nurse Name and Phone #: Jess F   S Name/Age/Gender Kelly Benson 23 y.o. female Room/Bed: 045C/045C  Code Status   Code Status: Prior  Home/SNF/Other Home Patient oriented to: self, place, time and situation Is this baseline? Yes   Triage Complete: Triage complete  Chief Complaint SOB, CHEST PRESSURE  Triage Note Pt c/o chest pain and shortness of breath x 2 days, also reports weight gain in her abdomen. LMP- 09/2018   Allergies No Known Allergies  Level of Care/Admitting Diagnosis ED Disposition    ED Disposition Condition Comment   Admit  Hospital Area: [facility] [[id number]]  Level of Care: Telemetry Medical [104]  I expect the patient will be discharged within 24 hours: No (not a candidate for 5C-Observation unit)  Covid Evaluation: Screening Protocol (No Symptoms)  Diagnosis: Multiple sclerosis (HCC) [340.ICD-9-CM]  Admitting Physician: Eduard Clos [phone]  Attending Physician: Eduard Clos [phone]  PT Class (Do Not Modify): Observation [104]  PT Acc Code (Do Not Modify): Observation [10022]       B Medical/Surgery History Past Medical History:  Diagnosis Date  . MS (multiple sclerosis) (HCC)   . Scoliosis    History reviewed. No pertinent surgical history.   A IV Location/Drains/Wounds Patient Lines/Drains/Airways Status   Active Line/Drains/Airways    Name:   Placement date:   Placement time:   Site:   Days:   Peripheral IV 10/14/18 Left Antecubital   10/14/18    1914    Antecubital   1          Intake/Output Last 24 hours No intake or output data in the 24 hours ending 10/15/18 0326  Labs/Imaging Results for orders placed or performed during the hospital encounter of 10/14/18 (from the past 48 hour(s))  Basic metabolic panel     Status: Abnormal   Collection Time: 10/14/18  3:39 PM  Result Value Ref Range   Sodium 138 135 - 145 mmol/L   Potassium 3.2 (L) 3.5 - 5.1  mmol/L   Chloride 106 98 - 111 mmol/L   CO2 20 (L) 22 - 32 mmol/L   Glucose, Bld 105 (H) 70 - 99 mg/dL   BUN 12 6 - 20 mg/dL   Creatinine, Ser 9.24 0.44 - 1.00 mg/dL   Calcium 9.1 8.9 - 26.8 mg/dL   GFR calc non Af Amer >60 >60 mL/min   GFR calc Af Amer >60 >60 mL/min   Anion gap 12 5 - 15    Comment: Performed at [facility], [address], [place], [place] [address]  CBC     Status: None   Collection Time: 10/14/18  3:39 PM  Result Value Ref Range   WBC 7.9 4.0 - 10.5 K/uL   RBC 4.82 3.87 - 5.11 MIL/uL   Hemoglobin 13.3 12.0 - 15.0 g/dL   HCT 22.2 97.9 - 89.2 %   MCV 84.6 80.0 - 100.0 fL   MCH 27.6 26.0 - 34.0 pg   MCHC 32.6 30.0 - 36.0 g/dL   RDW 11.9 41.7 - 40.8 %   Platelets 303 150 - 400 K/uL   nRBC 0.0 0.0 - 0.2 %    Comment: Performed at [facility], [address], [place], [place] [address]  Troponin I - ONCE - STAT     Status: None   Collection Time: 10/14/18  3:39 PM  Result Value Ref Range   Troponin I <0.03 <0.03  ng/mL    Comment: Performed at [facility], [address], [place], [place] [address]  I-Stat beta hCG blood, ED     Status: None   Collection Time: 10/14/18  3:52 PM  Result Value Ref Range   I-stat hCG, quantitative <5.0 <5 mIU/mL   Comment 3            Comment:   GEST. AGE      CONC.  (mIU/mL)   <=1 WEEK        5 - 50     2 WEEKS       50 - 500     3 WEEKS       100 - 10,000     4 WEEKS     1,000 - 30,000        FEMALE AND NON-PREGNANT FEMALE:     LESS THAN 5 mIU/mL   D-dimer, quantitative (not at [facility])     Status: None   Collection Time: 10/14/18  7:59 PM  Result Value Ref Range   D-Dimer, Quant 0.35 0.00 - 0.50 ug/mL-FEU    Comment: (NOTE) At the manufacturer cut-off of 0.50 ug/mL FEU, this assay has been documented to exclude PE with a sensitivity and negative predictive value of 97 to 99%.  At this time, this assay has not been approved by the FDA to exclude DVT/VTE. Results should be correlated with clinical  presentation. Performed at [facility], [address] [place], [place] [address]   SARS Coronavirus 2 (CEPHEID - Performed in [facility] hospital lab), Hosp Order     Status: None   Collection Time: 10/15/18  1:53 AM  Result Value Ref Range   SARS Coronavirus 2 NEGATIVE NEGATIVE    Comment: (NOTE) If result is NEGATIVE SARS-CoV-2 target nucleic acids are NOT DETECTED. The SARS-CoV-2 RNA is generally detectable in upper and lower  respiratory specimens during the acute phase of infection. The lowest  concentration of SARS-CoV-2 viral copies this assay can detect is 250  copies / mL. A negative result does not preclude SARS-CoV-2 infection  and should not be used as the sole basis for treatment or other  patient management decisions.  A negative result may occur with  improper specimen collection / handling, submission of specimen other  than nasopharyngeal swab, presence of viral mutation(s) within the  areas targeted by this assay, and inadequate number of viral copies  (<250 copies / mL). A negative result must be combined with clinical  observations, patient history, and epidemiological information. If result is POSITIVE SARS-CoV-2 target nucleic acids are DETECTED. The SARS-CoV-2 RNA is generally detectable in upper and lower  respiratory specimens dur ing the acute phase of infection.  Positive  results are indicative of active infection with SARS-CoV-2.  Clinical  correlation with patient history and other diagnostic information is  necessary to determine patient infection status.  Positive results do  not rule out bacterial infection or co-infection with other viruses. If result is PRESUMPTIVE POSTIVE SARS-CoV-2 nucleic acids MAY BE PRESENT.   A presumptive positive result was obtained on the submitted specimen  and confirmed on repeat testing.  While 2019 novel coronavirus  (SARS-CoV-2) nucleic acids may be present in the submitted sample  additional confirmatory  testing may be necessary for epidemiological  and / or clinical management purposes  to differentiate between  SARS-CoV-2 and other Sarbecovirus currently known to infect humans.  If clinically indicated additional testing with an alternate test  methodology [id number]) is advised. The SARS-CoV-2 RNA is generally  detectable in upper and lower respiratory sp ecimens during the acute  phase of infection. The expected result is Negative. Fact Sheet for Patients:  BoilerBrush.com.cy Fact Sheet for Healthcare Providers: https://pope.com/ This test is not yet approved or cleared by the [place] FDA and has been authorized for detection and/or diagnosis of SARS-CoV-2 by FDA under an Emergency Use Authorization (EUA).  This EUA will remain in effect (meaning this test can be used) for the duration of the COVID-19 declaration under Section 564(b)(1) of the Act, 21 U.S.C. section 360bbb-3(b)(1), unless the authorization is terminated or revoked sooner. Performed at [facility], [address], [place], [place] [address]    Dg Chest 2 View  Result Date: 10/14/2018 CLINICAL DATA:  Chest pain and shortness of breath EXAM: CHEST - 2 VIEW COMPARISON:  05/10/2018 FINDINGS: The heart size and mediastinal contours are within normal limits. Both lungs are clear. The visualized skeletal structures are unremarkable. IMPRESSION: Normal exam. Electronically Signed   By: Francene Boyers M.D.   On: 10/14/2018 16:05   Mr Brain W And Wo Contrast  Result Date: 10/15/2018 CLINICAL DATA:  Multiple sclerosis. Foot discomfort. Difficulty breathing. EXAM: MRI HEAD WITHOUT AND WITH CONTRAST MRI CERVICAL SPINE WITHOUT AND WITH CONTRAST TECHNIQUE: Multiplanar, multiecho pulse sequences of the brain and surrounding structures, and cervical spine, to include the craniocervical junction and cervicothoracic junction, were obtained without and with intravenous contrast.  CONTRAST:  8 mL Gadavist COMPARISON:  Brain MRI 06/11/2016 FINDINGS: MRI HEAD FINDINGS BRAIN: There is no acute infarct, acute hemorrhage or extra-axial collection. The midline structures are normal. No midline shift or other mass effect. There are approximately 20 hyperintense T2-weighted signal lesions within the periventricular and juxtacortical white matter, 10-20 of which show corresponding low T1-weighted signal. There are no infratentorial lesions. At least 3 of the lesions are new or larger (series 13 images 20, 27 15). There is faint contrast enhancement associated with the new left temporal and posterior left frontal white matter lesions, suggesting active demyelination. There is no age-advanced atrophy. The cerebral and cerebellar volume are age-appropriate. No hydrocephalus. Susceptibility-sensitive sequences show no chronic microhemorrhage or superficial siderosis. No mass lesion. VASCULAR: The major intracranial arterial and venous sinus flow voids are normal. SKULL: Calvarial bone marrow signal is normal. There is no skull base mass. SINUSES/ORBITS: No fluid levels or advanced mucosal thickening. No mastoid or middle ear effusion. The orbits are normal. MRI CERVICAL SPINE FINDINGS Alignment: Physiologic. Vertebrae: No fracture, evidence of discitis, or bone lesion. Cord: There are multiple hyperintense T2-weighted signal lesions within the spinal cord. A lesion at C2 has greatly increased in size and now stretches to the inferior aspect of C3, measuring 2.6 cm in craniocaudal dimension. Lesions at the C4 level have also worsened. C6 lesion has greatly increased in size. There is mild contrast enhancement of these lesions. Posterior Fossa, vertebral arteries, paraspinal tissues: Negative. Disc levels: There is no disc herniation, spinal canal stenosis or neural impingement. IMPRESSION: 1. Two supratentorial brain and at least 3 cervical spinal cord active demyelinating lesions with contrast  enhancement. A third supratentorial brain lesion is new/increased in size from the prior study but is not show contrast enhancement. 2. Otherwise unchanged distribution of white matter lesions consistent with multiple sclerosis. 3. No age advanced atrophy. Electronically Signed   By: Deatra Robinson M.D.   On: 10/15/2018 01:04   Mr Cervical Spine W Or Wo Contrast  Result  Date: 10/15/2018 CLINICAL DATA:  Multiple sclerosis. Foot discomfort. Difficulty breathing. EXAM: MRI HEAD WITHOUT AND WITH CONTRAST MRI CERVICAL SPINE WITHOUT AND WITH CONTRAST TECHNIQUE: Multiplanar, multiecho pulse sequences of the brain and surrounding structures, and cervical spine, to include the craniocervical junction and cervicothoracic junction, were obtained without and with intravenous contrast. CONTRAST:  8 mL Gadavist COMPARISON:  Brain MRI 06/11/2016 FINDINGS: MRI HEAD FINDINGS BRAIN: There is no acute infarct, acute hemorrhage or extra-axial collection. The midline structures are normal. No midline shift or other mass effect. There are approximately 20 hyperintense T2-weighted signal lesions within the periventricular and juxtacortical white matter, 10-20 of which show corresponding low T1-weighted signal. There are no infratentorial lesions. At least 3 of the lesions are new or larger (series 13 images 20, 27 15). There is faint contrast enhancement associated with the new left temporal and posterior left frontal white matter lesions, suggesting active demyelination. There is no age-advanced atrophy. The cerebral and cerebellar volume are age-appropriate. No hydrocephalus. Susceptibility-sensitive sequences show no chronic microhemorrhage or superficial siderosis. No mass lesion. VASCULAR: The major intracranial arterial and venous sinus flow voids are normal. SKULL: Calvarial bone marrow signal is normal. There is no skull base mass. SINUSES/ORBITS: No fluid levels or advanced mucosal thickening. No mastoid or middle ear effusion.  The orbits are normal. MRI CERVICAL SPINE FINDINGS Alignment: Physiologic. Vertebrae: No fracture, evidence of discitis, or bone lesion. Cord: There are multiple hyperintense T2-weighted signal lesions within the spinal cord. A lesion at C2 has greatly increased in size and now stretches to the inferior aspect of C3, measuring 2.6 cm in craniocaudal dimension. Lesions at the C4 level have also worsened. C6 lesion has greatly increased in size. There is mild contrast enhancement of these lesions. Posterior Fossa, vertebral arteries, paraspinal tissues: Negative. Disc levels: There is no disc herniation, spinal canal stenosis or neural impingement. IMPRESSION: 1. Two supratentorial brain and at least 3 cervical spinal cord active demyelinating lesions with contrast enhancement. A third supratentorial brain lesion is new/increased in size from the prior study but is not show contrast enhancement. 2. Otherwise unchanged distribution of white matter lesions consistent with multiple sclerosis. 3. No age advanced atrophy. Electronically Signed   By: Deatra Robinson M.D.   On: 10/15/2018 01:04    Pending Labs Unresulted Labs (From admission, onward)    Start     Ordered   10/15/18 0214  Neuromyelitis optica autoab, IgG  Once,   R     10/15/18 0213          Vitals/Pain Today's Vitals   10/15/18 0115 10/15/18 0130 10/15/18 0158 10/15/18 0300  BP: 112/84 113/71  120/77  Pulse: 66 83  72  Resp:      Temp:      TempSrc:      SpO2: 100% 100%  96%  Weight:      Height:      PainSc:   0-No pain     Isolation Precautions No active isolations  Medications Medications  methylPREDNISolone sodium succinate (SOLU-MEDROL) 1,000 mg in sodium chloride 0.9 % 50 mL IVPB (has no administration in time range)  sodium chloride flush (NS) 0.9 % injection 3 mL (3 mLs Intravenous Given 10/14/18 1913)  gadobutrol (GADAVIST) 1 MMOL/ML injection 8 mL (8 mLs Intravenous Contrast Given 10/15/18 0036)     Mobility walks Low fall risk   Focused Assessments Neuro Assessment Handoff:  Swallow screen pass? Yes          Neuro Assessment:  Neuro Checks:      Last Documented NIHSS Modified Score:   Has TPA been given? No If patient is a Neuro Trauma and patient is going to OR before floor call report to 4N Charge nurse: [phone] or [phone]     R Recommendations: See Admitting Provider Note  Report given to:   Additional Notes: n/a

## 2018-10-15 NOTE — Progress Notes (Signed)
CMT called stated patient had a 2 second pause. Ilean Skill LPN

## 2018-10-15 NOTE — Discharge Summary (Signed)
Physician Discharge Summary  Kelly Benson EHU:[id number] DOB: [birth year] DOA: 10/14/2018  PCP: No primary care provider on file.  Admit date: 10/14/2018 Discharge date: 10/15/2018  Admitted From: Home Disposition: signed out AMA  Discharge Condition: Unknown CODE STATUS: Full  Brief/Interim Summary: 23 year old female with history of multiple sclerosis currently not on medications presented with shortness of breath and chest pressure over the last few months.  She was also having neck and upper extremity tingling and numbness along with feet pain.  Chest x-ray and EKG was negative on presentation with negative troponin.  MRI of the brain and C-spine showed contrast-enhancing lesions concerning for multiple sclerosis exacerbation.  Neurology evaluated the patient and she was started on intravenous Solu-Medrol.  She stated that Solu-Medrol does not work for her and she signed out AGAINST MEDICAL ADVICE.  Risks of signing out AGAINST MEDICAL ADVICE including death was explained to her.  Discharge Diagnoses:  Principal Problem:   Multiple sclerosis exacerbation (HCC) Active Problems:   Multiple sclerosis (HCC)  Multiple sclerosis exacerbation -Patient was started on intravenous Solu-Medrol as per neurology recommendations.  She stated that Solu-Medrol does not work for her and she signed out AGAINST MEDICAL ADVICE.  Risks of signing out AGAINST MEDICAL ADVICE including death was explained to her.  Chest pain and shortness of breath -Questionable cause.  Work-up so far was negative.  Chest x-ray, EKG, troponin and d-dimer were negative.  Consultations:  Neurology   Procedures/Studies: Dg Chest 2 View  Result Date: 10/14/2018 CLINICAL DATA:  Chest pain and shortness of breath EXAM: CHEST - 2 VIEW COMPARISON:  05/10/2018 FINDINGS: The heart size and mediastinal contours are within normal limits. Both lungs are clear. The visualized skeletal structures are unremarkable. IMPRESSION: Normal  exam. Electronically Signed   By: Francene Boyers M.D.   On: 10/14/2018 16:05   Mr Brain W And Wo Contrast  Result Date: 10/15/2018 CLINICAL DATA:  Multiple sclerosis. Foot discomfort. Difficulty breathing. EXAM: MRI HEAD WITHOUT AND WITH CONTRAST MRI CERVICAL SPINE WITHOUT AND WITH CONTRAST TECHNIQUE: Multiplanar, multiecho pulse sequences of the brain and surrounding structures, and cervical spine, to include the craniocervical junction and cervicothoracic junction, were obtained without and with intravenous contrast. CONTRAST:  8 mL Gadavist COMPARISON:  Brain MRI 06/11/2016 FINDINGS: MRI HEAD FINDINGS BRAIN: There is no acute infarct, acute hemorrhage or extra-axial collection. The midline structures are normal. No midline shift or other mass effect. There are approximately 20 hyperintense T2-weighted signal lesions within the periventricular and juxtacortical white matter, 10-20 of which show corresponding low T1-weighted signal. There are no infratentorial lesions. At least 3 of the lesions are new or larger (series 13 images 20, 27 15). There is faint contrast enhancement associated with the new left temporal and posterior left frontal white matter lesions, suggesting active demyelination. There is no age-advanced atrophy. The cerebral and cerebellar volume are age-appropriate. No hydrocephalus. Susceptibility-sensitive sequences show no chronic microhemorrhage or superficial siderosis. No mass lesion. VASCULAR: The major intracranial arterial and venous sinus flow voids are normal. SKULL: Calvarial bone marrow signal is normal. There is no skull base mass. SINUSES/ORBITS: No fluid levels or advanced mucosal thickening. No mastoid or middle ear effusion. The orbits are normal. MRI CERVICAL SPINE FINDINGS Alignment: Physiologic. Vertebrae: No fracture, evidence of discitis, or bone lesion. Cord: There are multiple hyperintense T2-weighted signal lesions within the spinal cord. A lesion at C2 has greatly  increased in size and now stretches to the inferior aspect of C3, measuring 2.6 cm in craniocaudal  dimension. Lesions at the C4 level have also worsened. C6 lesion has greatly increased in size. There is mild contrast enhancement of these lesions. Posterior Fossa, vertebral arteries, paraspinal tissues: Negative. Disc levels: There is no disc herniation, spinal canal stenosis or neural impingement. IMPRESSION: 1. Two supratentorial brain and at least 3 cervical spinal cord active demyelinating lesions with contrast enhancement. A third supratentorial brain lesion is new/increased in size from the prior study but is not show contrast enhancement. 2. Otherwise unchanged distribution of white matter lesions consistent with multiple sclerosis. 3. No age advanced atrophy. Electronically Signed   By: Deatra Robinson M.D.   On: 10/15/2018 01:04   Mr Cervical Spine W Or Wo Contrast  Result Date: 10/15/2018 CLINICAL DATA:  Multiple sclerosis. Foot discomfort. Difficulty breathing. EXAM: MRI HEAD WITHOUT AND WITH CONTRAST MRI CERVICAL SPINE WITHOUT AND WITH CONTRAST TECHNIQUE: Multiplanar, multiecho pulse sequences of the brain and surrounding structures, and cervical spine, to include the craniocervical junction and cervicothoracic junction, were obtained without and with intravenous contrast. CONTRAST:  8 mL Gadavist COMPARISON:  Brain MRI 06/11/2016 FINDINGS: MRI HEAD FINDINGS BRAIN: There is no acute infarct, acute hemorrhage or extra-axial collection. The midline structures are normal. No midline shift or other mass effect. There are approximately 20 hyperintense T2-weighted signal lesions within the periventricular and juxtacortical white matter, 10-20 of which show corresponding low T1-weighted signal. There are no infratentorial lesions. At least 3 of the lesions are new or larger (series 13 images 20, 27 15). There is faint contrast enhancement associated with the new left temporal and posterior left frontal white  matter lesions, suggesting active demyelination. There is no age-advanced atrophy. The cerebral and cerebellar volume are age-appropriate. No hydrocephalus. Susceptibility-sensitive sequences show no chronic microhemorrhage or superficial siderosis. No mass lesion. VASCULAR: The major intracranial arterial and venous sinus flow voids are normal. SKULL: Calvarial bone marrow signal is normal. There is no skull base mass. SINUSES/ORBITS: No fluid levels or advanced mucosal thickening. No mastoid or middle ear effusion. The orbits are normal. MRI CERVICAL SPINE FINDINGS Alignment: Physiologic. Vertebrae: No fracture, evidence of discitis, or bone lesion. Cord: There are multiple hyperintense T2-weighted signal lesions within the spinal cord. A lesion at C2 has greatly increased in size and now stretches to the inferior aspect of C3, measuring 2.6 cm in craniocaudal dimension. Lesions at the C4 level have also worsened. C6 lesion has greatly increased in size. There is mild contrast enhancement of these lesions. Posterior Fossa, vertebral arteries, paraspinal tissues: Negative. Disc levels: There is no disc herniation, spinal canal stenosis or neural impingement. IMPRESSION: 1. Two supratentorial brain and at least 3 cervical spinal cord active demyelinating lesions with contrast enhancement. A third supratentorial brain lesion is new/increased in size from the prior study but is not show contrast enhancement. 2. Otherwise unchanged distribution of white matter lesions consistent with multiple sclerosis. 3. No age advanced atrophy. Electronically Signed   By: Deatra Robinson M.D.   On: 10/15/2018 01:04        The results of significant diagnostics from this hospitalization (including imaging, microbiology, ancillary and laboratory) are listed below for reference.     Microbiology: Recent Results (from the past 240 hour(s))  SARS Coronavirus 2 (CEPHEID - Performed in [facility] hospital lab), Hosp Order      Status: None   Collection Time: 10/15/18  1:53 AM  Result Value Ref Range Status   SARS Coronavirus 2 NEGATIVE NEGATIVE Final    Comment: (NOTE) If result is  NEGATIVE SARS-CoV-2 target nucleic acids are NOT DETECTED. The SARS-CoV-2 RNA is generally detectable in upper and lower  respiratory specimens during the acute phase of infection. The lowest  concentration of SARS-CoV-2 viral copies this assay can detect is 250  copies / mL. A negative result does not preclude SARS-CoV-2 infection  and should not be used as the sole basis for treatment or other  patient management decisions.  A negative result may occur with  improper specimen collection / handling, submission of specimen other  than nasopharyngeal swab, presence of viral mutation(s) within the  areas targeted by this assay, and inadequate number of viral copies  (<250 copies / mL). A negative result must be combined with clinical  observations, patient history, and epidemiological information. If result is POSITIVE SARS-CoV-2 target nucleic acids are DETECTED. The SARS-CoV-2 RNA is generally detectable in upper and lower  respiratory specimens dur ing the acute phase of infection.  Positive  results are indicative of active infection with SARS-CoV-2.  Clinical  correlation with patient history and other diagnostic information is  necessary to determine patient infection status.  Positive results do  not rule out bacterial infection or co-infection with other viruses. If result is PRESUMPTIVE POSTIVE SARS-CoV-2 nucleic acids MAY BE PRESENT.   A presumptive positive result was obtained on the submitted specimen  and confirmed on repeat testing.  While 2019 novel coronavirus  (SARS-CoV-2) nucleic acids may be present in the submitted sample  additional confirmatory testing may be necessary for epidemiological  and / or clinical management purposes  to differentiate between  SARS-CoV-2 and other Sarbecovirus currently known to  infect humans.  If clinically indicated additional testing with an alternate test  methodology [phone]) is advised. The SARS-CoV-2 RNA is generally  detectable in upper and lower respiratory sp ecimens during the acute  phase of infection. The expected result is Negative. Fact Sheet for Patients:  BoilerBrush.com.cy Fact Sheet for Healthcare Providers: https://pope.com/ This test is not yet approved or cleared by the [place] FDA and has been authorized for detection and/or diagnosis of SARS-CoV-2 by FDA under an Emergency Use Authorization (EUA).  This EUA will remain in effect (meaning this test can be used) for the duration of the COVID-19 declaration under Section 564(b)(1) of the Act, 21 U.S.C. section 360bbb-3(b)(1), unless the authorization is terminated or revoked sooner. Performed at [facility], [address], [place], [place] [address]      Labs: BNP (last 3 results) No results for input(s): BNP in the last 8760 hours. Basic Metabolic Panel: Recent Labs  Lab 10/14/18 1539 10/15/18 0541  NA 138 138  K 3.2* 3.1*  CL 106 109  CO2 20* 22  GLUCOSE 105* 115*  BUN 12 9  CREATININE 0.70 0.61  CALCIUM 9.1 8.7*   Liver Function Tests: No results for input(s): AST, ALT, ALKPHOS, BILITOT, PROT, ALBUMIN in the last 168 hours. No results for input(s): LIPASE, AMYLASE in the last 168 hours. No results for input(s): AMMONIA in the last 168 hours. CBC: Recent Labs  Lab 10/14/18 1539 10/15/18 0541  WBC 7.9 8.0  HGB 13.3 12.3  HCT 40.8 36.3  MCV 84.6 82.7  PLT 303 259   Cardiac Enzymes: Recent Labs  Lab 10/14/18 1539  TROPONINI <0.03   BNP: Invalid input(s): POCBNP CBG: No results for input(s): GLUCAP in the last 168 hours. D-Dimer Recent Labs    10/14/18 1959  DDIMER 0.35   Hgb A1c No results for input(s): HGBA1C in  the last 72 hours. Lipid Profile No results for input(s): CHOL, HDL,  LDLCALC, TRIG, CHOLHDL, LDLDIRECT in the last 72 hours. Thyroid function studies No results for input(s): TSH, T4TOTAL, T3FREE, THYROIDAB in the last 72 hours.  Invalid input(s): FREET3 Anemia work up No results for input(s): VITAMINB12, FOLATE, FERRITIN, TIBC, IRON, RETICCTPCT in the last 72 hours. Urinalysis    Component Value Date/Time   COLORURINE YELLOW 05/10/2018 1001   APPEARANCEUR HAZY (A) 05/10/2018 1001   LABSPEC 1.027 05/10/2018 1001   PHURINE 6.0 05/10/2018 1001   GLUCOSEU NEGATIVE 05/10/2018 1001   HGBUR NEGATIVE 05/10/2018 1001   BILIRUBINUR NEGATIVE 05/10/2018 1001   KETONESUR NEGATIVE 05/10/2018 1001   PROTEINUR NEGATIVE 05/10/2018 1001   UROBILINOGEN 1.0 03/11/2017 1450   NITRITE NEGATIVE 05/10/2018 1001   LEUKOCYTESUR NEGATIVE 05/10/2018 1001   Sepsis Labs Invalid input(s): PROCALCITONIN,  WBC,  LACTICIDVEN Microbiology Recent Results (from the past 240 hour(s))  SARS Coronavirus 2 (CEPHEID - Performed in [facility] hospital lab), Hosp Order     Status: None   Collection Time: 10/15/18  1:53 AM  Result Value Ref Range Status   SARS Coronavirus 2 NEGATIVE NEGATIVE Final    Comment: (NOTE) If result is NEGATIVE SARS-CoV-2 target nucleic acids are NOT DETECTED. The SARS-CoV-2 RNA is generally detectable in upper and lower  respiratory specimens during the acute phase of infection. The lowest  concentration of SARS-CoV-2 viral copies this assay can detect is 250  copies / mL. A negative result does not preclude SARS-CoV-2 infection  and should not be used as the sole basis for treatment or other  patient management decisions.  A negative result may occur with  improper specimen collection / handling, submission of specimen other  than nasopharyngeal swab, presence of viral mutation(s) within the  areas targeted by this assay, and inadequate number of viral copies  (<250 copies / mL). A negative result must be combined with clinical  observations, patient  history, and epidemiological information. If result is POSITIVE SARS-CoV-2 target nucleic acids are DETECTED. The SARS-CoV-2 RNA is generally detectable in upper and lower  respiratory specimens dur ing the acute phase of infection.  Positive  results are indicative of active infection with SARS-CoV-2.  Clinical  correlation with patient history and other diagnostic information is  necessary to determine patient infection status.  Positive results do  not rule out bacterial infection or co-infection with other viruses. If result is PRESUMPTIVE POSTIVE SARS-CoV-2 nucleic acids MAY BE PRESENT.   A presumptive positive result was obtained on the submitted specimen  and confirmed on repeat testing.  While 2019 novel coronavirus  (SARS-CoV-2) nucleic acids may be present in the submitted sample  additional confirmatory testing may be necessary for epidemiological  and / or clinical management purposes  to differentiate between  SARS-CoV-2 and other Sarbecovirus currently known to infect humans.  If clinically indicated additional testing with an alternate test  methodology [phone] is advised. The SARS-CoV-2 RNA is generally  detectable in upper and lower respiratory sp ecimens during the acute  phase of infection. The expected result is Negative. Fact Sheet for Patients:  BoilerBrush.com.cyhttps://www.fda.gov/media/136312/download Fact Sheet for Healthcare Providers: https://pope.com/https://www.fda.gov/media/136313/download This test is not yet approved or cleared by the [place] FDA and has been authorized for detection and/or diagnosis of SARS-CoV-2 by FDA under an Emergency Use Authorization (EUA).  This EUA will remain in effect (meaning this test can be used) for the duration of the COVID-19 declaration under Section 564(b)(1) of the Act,  21 U.S.C. section 360bbb-3(b)(1), unless the authorization is terminated or revoked sooner. Performed at [facility], [address] [place], [place] [address]       SIGNED:   Glade Lloyd, MD  [facility] 10/15/2018, 10:39 AM

## 2018-10-15 NOTE — H&P (Signed)
History and Physical    Kelly Benson ZOX:[id number] DOB: [birth year] DOA: 10/14/2018  PCP: No primary care provider on file.  Patient coming from: Home.  Chief Complaint: Shortness of breath tingling and numbness of upper extremities and neck.  HPI: Kelly Benson is a 23 y.o. female with history of multiple sclerosis who has not been taking her medications for last 3 years presents to the ER with concerns of chest pressure and shortness of breath over the last few months.  Patient also has benign neck and upper extremity tingling and numbness characteristic of her multiple sclerosis exacerbation for last few months.  Has been exam feet pain.  The plantar aspect of the foot on both side hurts on waking up and decreased by evening.  Patient also states he has some epigastric fullness over the last few months.  ED Course: In the ER chest x-ray was negative EKG was showing normal sinus rhythm and d-dimer was negative troponin was negative.  Given the tingling and numbness of upper extremities MRI of the brain and C-spine was done which shows contrast-enhancing lesion concerning for multiple sclerosis exacerbation.  Neurologist on-call was consulted and patient is placed on IV Solu-Medrol for multiple sclerosis exacerbation.  Review of Systems: As per HPI, rest all negative.   Past Medical History:  Diagnosis Date  . MS (multiple sclerosis) (HCC)   . Scoliosis     History reviewed. No pertinent surgical history.   reports that she has quit smoking. Her smoking use included cigarettes. She has never used smokeless tobacco. She reports that she does not drink alcohol or use drugs.  No Known Allergies  Family History  Problem Relation Age of Onset  . Hypertension Father     Prior to Admission medications   Medication Sig Start Date End Date Taking? Authorizing Provider  albuterol (PROVENTIL HFA;VENTOLIN HFA) 108 (90 Base) MCG/ACT inhaler Inhale 1-2 puffs into the lungs every 6 (six)  hours as needed for wheezing or shortness of breath. 05/10/18  Yes Mesner, Barbara CowerJason, MD  B Complex-C (B-COMPLEX WITH VITAMIN C) tablet Take 1 tablet by mouth daily.   Yes [provider]  folic acid (FOLVITE) 1 MG tablet Take 1 mg by mouth daily.   Yes [provider]  guaiFENesin (MUCINEX) 600 MG 12 hr tablet Take 600 mg by mouth 2 (two) times daily as needed for cough or to loosen phlegm.   Yes [provider]  ibuprofen (ADVIL,MOTRIN) 200 MG tablet Take 200 mg by mouth every 6 (six) hours as needed for headache or cramping.   Yes [provider]  omega-3 acid ethyl esters (LOVAZA) 1 g capsule Take 2 g by mouth daily.   Yes [provider]  magic mouthwash w/lidocaine SOLN Take 10 mLs by mouth every 2 (two) hours as needed for mouth pain. Patient not taking: Reported on 10/15/2018 03/11/17   Bing NeighborsHarris, Kimberly S, FNP    Physical Exam: Vitals:   10/15/18 0100 10/15/18 0115 10/15/18 0130 10/15/18 0300  BP: 116/71 112/84 113/71 120/77  Pulse: 74 66 83 72  Resp:      Temp:      TempSrc:      SpO2: 100% 100% 100% 96%  Weight:      Height:          Constitutional: Moderately built and nourished. Vitals:   10/15/18 0100 10/15/18 0115 10/15/18 0130 10/15/18 0300  BP: 116/71 112/84 113/71 120/77  Pulse: 74 66 83 72  Resp:  Temp:      TempSrc:      SpO2: 100% 100% 100% 96%  Weight:      Height:       Eyes: Anicteric no pallor. ENMT: No discharge from the ears eyes nose and mouth. Neck: No mass felt.  No neck rigidity.  No JVD appreciated. Respiratory: No rhonchi or crepitations. Cardiovascular: S1-S2 heard. Abdomen: Soft nontender bowel sounds present. Musculoskeletal: No edema.  No joint effusion. Skin: No rash. Neurologic: Alert awake oriented to time place and person.  Moves all extremities. Psychiatric: Appears normal per normal affect.   Labs on Admission: I have personally reviewed following labs and imaging studies  CBC:  Recent Labs  Lab 10/14/18 1539  WBC 7.9  HGB 13.3  HCT 40.8  MCV 84.6  PLT 303   Basic Metabolic Panel: Recent Labs  Lab 10/14/18 1539  NA 138  K 3.2*  CL 106  CO2 20*  GLUCOSE 105*  BUN 12  CREATININE 0.70  CALCIUM 9.1   GFR: Estimated Creatinine Clearance: 113.7 mL/min (by C-G formula based on SCr of 0.7 mg/dL). Liver Function Tests: No results for input(s): AST, ALT, ALKPHOS, BILITOT, PROT, ALBUMIN in the last 168 hours. No results for input(s): LIPASE, AMYLASE in the last 168 hours. No results for input(s): AMMONIA in the last 168 hours. Coagulation Profile: No results for input(s): INR, PROTIME in the last 168 hours. Cardiac Enzymes: Recent Labs  Lab 10/14/18 1539  TROPONINI <0.03   BNP (last 3 results) No results for input(s): PROBNP in the last 8760 hours. HbA1C: No results for input(s): HGBA1C in the last 72 hours. CBG: No results for input(s): GLUCAP in the last 168 hours. Lipid Profile: No results for input(s): CHOL, HDL, LDLCALC, TRIG, CHOLHDL, LDLDIRECT in the last 72 hours. Thyroid Function Tests: No results for input(s): TSH, T4TOTAL, FREET4, T3FREE, THYROIDAB in the last 72 hours. Anemia Panel: No results for input(s): VITAMINB12, FOLATE, FERRITIN, TIBC, IRON, RETICCTPCT in the last 72 hours. Urine analysis:    Component Value Date/Time   COLORURINE YELLOW 05/10/2018 1001   APPEARANCEUR HAZY (A) 05/10/2018 1001   LABSPEC 1.027 05/10/2018 1001   PHURINE 6.0 05/10/2018 1001   GLUCOSEU NEGATIVE 05/10/2018 1001   HGBUR NEGATIVE 05/10/2018 1001   BILIRUBINUR NEGATIVE 05/10/2018 1001   KETONESUR NEGATIVE 05/10/2018 1001   PROTEINUR NEGATIVE 05/10/2018 1001   UROBILINOGEN 1.0 03/11/2017 1450   NITRITE NEGATIVE 05/10/2018 1001   LEUKOCYTESUR NEGATIVE 05/10/2018 1001   Sepsis Labs: @LABRCNTIP (procalcitonin:4,lacticidven:4) ) Recent Results (from the past 240 hour(s))  SARS Coronavirus 2 (CEPHEID - Performed in [facility] hospital lab), Hosp  Order     Status: None   Collection Time: 10/15/18  1:53 AM  Result Value Ref Range Status   SARS Coronavirus 2 NEGATIVE NEGATIVE Final    Comment: (NOTE) If result is NEGATIVE SARS-CoV-2 target nucleic acids are NOT DETECTED. The SARS-CoV-2 RNA is generally detectable in upper and lower  respiratory specimens during the acute phase of infection. The lowest  concentration of SARS-CoV-2 viral copies this assay can detect is 250  copies / mL. A negative result does not preclude SARS-CoV-2 infection  and should not be used as the sole basis for treatment or other  patient management decisions.  A negative result may occur with  improper specimen collection / handling, submission of specimen other  than nasopharyngeal swab, presence of viral mutation(s) within the  areas targeted by this assay, and inadequate number of viral copies  (<250 copies /  mL). A negative result must be combined with clinical  observations, patient history, and epidemiological information. If result is POSITIVE SARS-CoV-2 target nucleic acids are DETECTED. The SARS-CoV-2 RNA is generally detectable in upper and lower  respiratory specimens dur ing the acute phase of infection.  Positive  results are indicative of active infection with SARS-CoV-2.  Clinical  correlation with patient history and other diagnostic information is  necessary to determine patient infection status.  Positive results do  not rule out bacterial infection or co-infection with other viruses. If result is PRESUMPTIVE POSTIVE SARS-CoV-2 nucleic acids MAY BE PRESENT.   A presumptive positive result was obtained on the submitted specimen  and confirmed on repeat testing.  While 2019 novel coronavirus  (SARS-CoV-2) nucleic acids may be present in the submitted sample  additional confirmatory testing may be necessary for epidemiological  and / or clinical management purposes  to differentiate between  SARS-CoV-2 and other Sarbecovirus currently  known to infect humans.  If clinically indicated additional testing with an alternate test  methodology [phone]) is advised. The SARS-CoV-2 RNA is generally  detectable in upper and lower respiratory sp ecimens during the acute  phase of infection. The expected result is Negative. Fact Sheet for Patients:  BoilerBrush.com.cy Fact Sheet for Healthcare Providers: https://pope.com/ This test is not yet approved or cleared by the [place] FDA and has been authorized for detection and/or diagnosis of SARS-CoV-2 by FDA under an Emergency Use Authorization (EUA).  This EUA will remain in effect (meaning this test can be used) for the duration of the COVID-19 declaration under Section 564(b)(1) of the Act, 21 U.S.C. section 360bbb-3(b)(1), unless the authorization is terminated or revoked sooner. Performed at [facility], [address] [place], [place] [address]      Radiological Exams on Admission: Dg Chest 2 View  Result Date: 10/14/2018 CLINICAL DATA:  Chest pain and shortness of breath EXAM: CHEST - 2 VIEW COMPARISON:  05/10/2018 FINDINGS: The heart size and mediastinal contours are within normal limits. Both lungs are clear. The visualized skeletal structures are unremarkable. IMPRESSION: Normal exam. Electronically Signed   By: Francene Boyers M.D.   On: 10/14/2018 16:05   Mr Brain W And Wo Contrast  Result Date: 10/15/2018 CLINICAL DATA:  Multiple sclerosis. Foot discomfort. Difficulty breathing. EXAM: MRI HEAD WITHOUT AND WITH CONTRAST MRI CERVICAL SPINE WITHOUT AND WITH CONTRAST TECHNIQUE: Multiplanar, multiecho pulse sequences of the brain and surrounding structures, and cervical spine, to include the craniocervical junction and cervicothoracic junction, were obtained without and with intravenous contrast. CONTRAST:  8 mL Gadavist COMPARISON:  Brain MRI 06/11/2016 FINDINGS: MRI HEAD FINDINGS BRAIN: There is no acute infarct,  acute hemorrhage or extra-axial collection. The midline structures are normal. No midline shift or other mass effect. There are approximately 20 hyperintense T2-weighted signal lesions within the periventricular and juxtacortical white matter, 10-20 of which show corresponding low T1-weighted signal. There are no infratentorial lesions. At least 3 of the lesions are new or larger (series 13 images 20, 27 15). There is faint contrast enhancement associated with the new left temporal and posterior left frontal white matter lesions, suggesting active demyelination. There is no age-advanced atrophy. The cerebral and cerebellar volume are age-appropriate. No hydrocephalus. Susceptibility-sensitive sequences show no chronic microhemorrhage or superficial siderosis. No mass lesion. VASCULAR: The major intracranial arterial and venous sinus flow voids are normal. SKULL: Calvarial bone marrow signal is normal. There is no skull base mass. SINUSES/ORBITS: No fluid levels or advanced mucosal thickening.  No mastoid or middle ear effusion. The orbits are normal. MRI CERVICAL SPINE FINDINGS Alignment: Physiologic. Vertebrae: No fracture, evidence of discitis, or bone lesion. Cord: There are multiple hyperintense T2-weighted signal lesions within the spinal cord. A lesion at C2 has greatly increased in size and now stretches to the inferior aspect of C3, measuring 2.6 cm in craniocaudal dimension. Lesions at the C4 level have also worsened. C6 lesion has greatly increased in size. There is mild contrast enhancement of these lesions. Posterior Fossa, vertebral arteries, paraspinal tissues: Negative. Disc levels: There is no disc herniation, spinal canal stenosis or neural impingement. IMPRESSION: 1. Two supratentorial brain and at least 3 cervical spinal cord active demyelinating lesions with contrast enhancement. A third supratentorial brain lesion is new/increased in size from the prior study but is not show contrast enhancement.  2. Otherwise unchanged distribution of white matter lesions consistent with multiple sclerosis. 3. No age advanced atrophy. Electronically Signed   By: Deatra Robinson M.D.   On: 10/15/2018 01:04   Mr Cervical Spine W Or Wo Contrast  Result Date: 10/15/2018 CLINICAL DATA:  Multiple sclerosis. Foot discomfort. Difficulty breathing. EXAM: MRI HEAD WITHOUT AND WITH CONTRAST MRI CERVICAL SPINE WITHOUT AND WITH CONTRAST TECHNIQUE: Multiplanar, multiecho pulse sequences of the brain and surrounding structures, and cervical spine, to include the craniocervical junction and cervicothoracic junction, were obtained without and with intravenous contrast. CONTRAST:  8 mL Gadavist COMPARISON:  Brain MRI 06/11/2016 FINDINGS: MRI HEAD FINDINGS BRAIN: There is no acute infarct, acute hemorrhage or extra-axial collection. The midline structures are normal. No midline shift or other mass effect. There are approximately 20 hyperintense T2-weighted signal lesions within the periventricular and juxtacortical white matter, 10-20 of which show corresponding low T1-weighted signal. There are no infratentorial lesions. At least 3 of the lesions are new or larger (series 13 images 20, 27 15). There is faint contrast enhancement associated with the new left temporal and posterior left frontal white matter lesions, suggesting active demyelination. There is no age-advanced atrophy. The cerebral and cerebellar volume are age-appropriate. No hydrocephalus. Susceptibility-sensitive sequences show no chronic microhemorrhage or superficial siderosis. No mass lesion. VASCULAR: The major intracranial arterial and venous sinus flow voids are normal. SKULL: Calvarial bone marrow signal is normal. There is no skull base mass. SINUSES/ORBITS: No fluid levels or advanced mucosal thickening. No mastoid or middle ear effusion. The orbits are normal. MRI CERVICAL SPINE FINDINGS Alignment: Physiologic. Vertebrae: No fracture, evidence of discitis, or bone  lesion. Cord: There are multiple hyperintense T2-weighted signal lesions within the spinal cord. A lesion at C2 has greatly increased in size and now stretches to the inferior aspect of C3, measuring 2.6 cm in craniocaudal dimension. Lesions at the C4 level have also worsened. C6 lesion has greatly increased in size. There is mild contrast enhancement of these lesions. Posterior Fossa, vertebral arteries, paraspinal tissues: Negative. Disc levels: There is no disc herniation, spinal canal stenosis or neural impingement. IMPRESSION: 1. Two supratentorial brain and at least 3 cervical spinal cord active demyelinating lesions with contrast enhancement. A third supratentorial brain lesion is new/increased in size from the prior study but is not show contrast enhancement. 2. Otherwise unchanged distribution of white matter lesions consistent with multiple sclerosis. 3. No age advanced atrophy. Electronically Signed   By: Deatra Robinson M.D.   On: 10/15/2018 01:04    EKG: Independently reviewed.  Normal sinus rhythm.  Assessment/Plan Principal Problem:   Multiple sclerosis exacerbation (HCC) Active Problems:   Multiple sclerosis (HCC)  1. Multiple sclerosis exacerbation -appreciate neurology consult and patient has been placed on Solu-Medrol pulse dose.  Will add Protonix. 2. Chest pain and shortness of breath has been persistent not related to exertion has been going on for few months.  In addition patient also complains of epigastric fullness.  Will check troponin d-dimer was negative.  In addition we will add LFTs and lipase.  Protonix has been added.  If LFTs are elevated of the epigastric discomfort does not improve may consider sonogram of the abdomen. 3. Foot pain likely could be plantar fasciitis. 4. Mild hypokalemia -repeat metabolic panel is pending.  Will replace if potassium is still low.   DVT prophylaxis: Lovenox. Code Status: Full code. Family Communication: Discussed with patient.  Disposition Plan: Home. Consults called: Neurology. Admission status: Observation.   Eduard Clos MD [facility] Pager [phone].  If 7PM-7AM, please contact night-coverage [email or web address] Password [email or web address]  10/15/2018, 3:36 AM

## 2018-10-16 LAB — NEUROMYELITIS OPTICA AUTOAB, IGG: NMO-IgG: <1.5 U/mL (ref 0.0–3.0)

## 2019-02-24 ENCOUNTER — Other Ambulatory Visit: Payer: Self-pay

## 2019-02-24 ENCOUNTER — Inpatient Hospital Stay (HOSPITAL_COMMUNITY)
Admission: EM | Admit: 2019-02-24 | Discharge: 2019-03-01 | DRG: 060 | Disposition: A | Discharge status: Home / Self Care | Payer: Self-pay | Attending: Internal Medicine | Admitting: Internal Medicine

## 2019-02-24 ENCOUNTER — Encounter (HOSPITAL_COMMUNITY): Payer: Self-pay

## 2019-02-24 DIAGNOSIS — R270 Ataxia, unspecified: Secondary | ICD-10-CM | POA: Diagnosis present

## 2019-02-24 DIAGNOSIS — Z8249 Family history of ischemic heart disease and other diseases of the circulatory system: Secondary | ICD-10-CM

## 2019-02-24 DIAGNOSIS — R739 Hyperglycemia, unspecified: Secondary | ICD-10-CM | POA: Diagnosis not present

## 2019-02-24 DIAGNOSIS — Z79899 Other long term (current) drug therapy: Secondary | ICD-10-CM

## 2019-02-24 DIAGNOSIS — Z87891 Personal history of nicotine dependence: Secondary | ICD-10-CM

## 2019-02-24 DIAGNOSIS — D72829 Elevated white blood cell count, unspecified: Secondary | ICD-10-CM | POA: Diagnosis not present

## 2019-02-24 DIAGNOSIS — Z20828 Contact with and (suspected) exposure to other viral communicable diseases: Secondary | ICD-10-CM | POA: Diagnosis present

## 2019-02-24 DIAGNOSIS — T380X5A Adverse effect of glucocorticoids and synthetic analogues, initial encounter: Secondary | ICD-10-CM | POA: Diagnosis not present

## 2019-02-24 DIAGNOSIS — G35 Multiple sclerosis: Principal | ICD-10-CM | POA: Diagnosis present

## 2019-02-24 DIAGNOSIS — M419 Scoliosis, unspecified: Secondary | ICD-10-CM | POA: Diagnosis present

## 2019-02-24 LAB — CBC
HCT: 38.6 % (ref 36.0–46.0)
Hemoglobin: 12.7 g/dL (ref 12.0–15.0)
MCH: 27.8 pg (ref 26.0–34.0)
MCHC: 32.9 g/dL (ref 30.0–36.0)
MCV: 84.5 fL (ref 80.0–100.0)
Platelets: 343 10*3/uL (ref 150–400)
RBC: 4.57 MIL/uL (ref 3.87–5.11)
RDW: 13.5 % (ref 11.5–15.5)
WBC: 8.1 10*3/uL (ref 4.0–10.5)
nRBC: 0 % (ref 0.0–0.2)

## 2019-02-24 LAB — I-STAT BETA HCG BLOOD, ED (MC, WL, AP ONLY): I-stat hCG, quantitative: <5 m[IU]/mL (ref <5)

## 2019-02-24 LAB — CBG MONITORING, ED: Glucose-Capillary: 108 mg/dL — ABNORMAL HIGH (ref 70–99)

## 2019-02-24 MED ORDER — SODIUM CHLORIDE 0.9% FLUSH: 3.0000 mL | Freq: Once | INTRAVENOUS | Status: DC

## 2019-02-24 NOTE — ED Triage Notes (Signed)
Pt c.o left sided numbnes, tingling and weakness for the past 2 weeks. Pt also reports intermittent dizziness for years but worse this week. Pt a.o, no neuro deficits noted.

## 2019-02-25 ENCOUNTER — Encounter (HOSPITAL_COMMUNITY): Payer: Self-pay | Admitting: Internal Medicine

## 2019-02-25 ENCOUNTER — Inpatient Hospital Stay (HOSPITAL_COMMUNITY): Payer: Self-pay

## 2019-02-25 DIAGNOSIS — G35 Multiple sclerosis: Principal | ICD-10-CM

## 2019-02-25 LAB — URINALYSIS, ROUTINE W REFLEX MICROSCOPIC
Bilirubin Urine: NEGATIVE
Glucose, UA: NEGATIVE mg/dL
Ketones, ur: NEGATIVE mg/dL
Leukocytes,Ua: NEGATIVE
Nitrite: NEGATIVE
Protein, ur: NEGATIVE mg/dL
Specific Gravity, Urine: 1.026 (ref 1.005–1.030)
pH: 5 (ref 5.0–8.0)

## 2019-02-25 LAB — BASIC METABOLIC PANEL
Anion gap: 9 (ref 5–15)
BUN: 12 mg/dL (ref 6–20)
CO2: 22 mmol/L (ref 22–32)
Calcium: 9 mg/dL (ref 8.9–10.3)
Chloride: 108 mmol/L (ref 98–111)
Creatinine, Ser: 0.62 mg/dL (ref 0.44–1.00)
GFR calc Af Amer: >60 mL/min (ref >60)
GFR calc non Af Amer: >60 mL/min (ref >60)
Glucose, Bld: 107 mg/dL — ABNORMAL HIGH (ref 70–99)
Potassium: 3.4 mmol/L — ABNORMAL LOW (ref 3.5–5.1)
Sodium: 139 mmol/L (ref 135–145)

## 2019-02-25 LAB — SARS CORONAVIRUS 2 (TAT 6-24 HRS): SARS Coronavirus 2: NEGATIVE

## 2019-02-25 MED ORDER — ONDANSETRON HCL 4 MG PO TABS: 4.0000 mg | ORAL_TABLET | Freq: Four times a day (QID) | ORAL | Status: DC | PRN

## 2019-02-25 MED ORDER — SODIUM CHLORIDE 0.9 % IV SOLN
Freq: Once | INTRAVENOUS | Status: AC
  Administered 2019-02-25: 07:00:00 via INTRAVENOUS

## 2019-02-25 MED ORDER — SODIUM CHLORIDE 0.9 % IV SOLN
1000.0000 mg | Freq: Every day | INTRAVENOUS | Status: AC
  Administered 2019-02-25 – 2019-03-01 (×5): 1000 mg via INTRAVENOUS
  Filled 2019-02-25 (×5): qty 8

## 2019-02-25 MED ORDER — B COMPLEX-C PO TABS
1.0000 | ORAL_TABLET | Freq: Every day | ORAL | Status: DC
  Administered 2019-02-25 – 2019-03-01 (×5): 1 via ORAL
  Filled 2019-02-25 (×5): qty 1

## 2019-02-25 MED ORDER — GADOBUTROL 1 MMOL/ML IV SOLN
10.0000 mL | Freq: Once | INTRAVENOUS | Status: AC | PRN
  Administered 2019-02-25: 10 mL via INTRAVENOUS

## 2019-02-25 MED ORDER — ENOXAPARIN SODIUM 40 MG/0.4ML ~~LOC~~ SOLN
40.0000 mg | SUBCUTANEOUS | Status: DC
  Filled 2019-02-25 (×4): qty 0.4

## 2019-02-25 MED ORDER — ONDANSETRON HCL 4 MG/2ML IJ SOLN: 4.0000 mg | Freq: Four times a day (QID) | INTRAMUSCULAR | Status: DC | PRN

## 2019-02-25 MED ORDER — ACETAMINOPHEN 650 MG RE SUPP: 650.0000 mg | Freq: Four times a day (QID) | RECTAL | Status: DC | PRN

## 2019-02-25 MED ORDER — PANTOPRAZOLE SODIUM 40 MG PO TBEC
40.0000 mg | DELAYED_RELEASE_TABLET | Freq: Every day | ORAL | Status: DC
  Administered 2019-02-25 – 2019-03-01 (×5): 40 mg via ORAL
  Filled 2019-02-25 (×5): qty 1

## 2019-02-25 MED ORDER — FOLIC ACID 1 MG PO TABS
1.0000 mg | ORAL_TABLET | Freq: Every day | ORAL | Status: DC
  Administered 2019-02-25 – 2019-03-01 (×5): 1 mg via ORAL
  Filled 2019-02-25 (×5): qty 1

## 2019-02-25 MED ORDER — ACETAMINOPHEN 325 MG PO TABS: 650.0000 mg | ORAL_TABLET | Freq: Four times a day (QID) | ORAL | Status: DC | PRN

## 2019-02-25 NOTE — ED Provider Notes (Signed)
[facility] EMERGENCY DEPARTMENT Provider Note   CSN: [id number] Arrival date & time: 02/24/19  2138     History   Chief Complaint Chief Complaint  Patient presents with  . Weakness    HPI Kelly Benson is a 23 y.o. female with a past medical history of MS, chronic dizziness, not currently on any treatments, who presents today for evaluation of left-sided sensory changes and worsening dizziness.  She states that she has been having decreased sensation in the left arm on the ulnar aspect and over the Left third, fourth, and fifth fingers.  She also reports decreased sensation in a band over the left side of her abdomen.  She reports subjective weakness worsening over the past 2 weeks and her left arm and leg.  She denies any fevers.  She reports that her dizziness is normally intermittent however has been much worse and continuous recently.  She states that today she was unable to work due to her symptoms causing her to come in.  She denies any trauma.  No recent sick contacts.  No headache, nausea, vomiting or diarrhea.      HPI  Past Medical History:  Diagnosis Date  . Anginal pain (HCC)   . Dyspnea   . Headache   . MS (multiple sclerosis) (HCC)   . Scoliosis     Patient Active Problem List   Diagnosis Date Noted  . Multiple sclerosis exacerbation (HCC) 06/11/2016  . Does not have health insurance 06/11/2016  . Vaginal discharge 09/14/2015  . Multiple sclerosis (HCC)   . Dizziness   . Demyelination, corpus callosum central (HCC)   . MS (multiple sclerosis) (HCC) 09/02/2015    History reviewed. No pertinent surgical history.   OB History   No obstetric history on file.      Home Medications    Prior to Admission medications   Medication Sig Start Date End Date Taking? Authorizing Provider  B Complex-C (B-COMPLEX WITH VITAMIN C) tablet Take 1 tablet by mouth daily.   Yes [provider]  folic acid (FOLVITE) 1 MG tablet Take  1 mg by mouth daily.   Yes [provider]  omega-3 acid ethyl esters (LOVAZA) 1 g capsule Take 2 g by mouth daily.   Yes [provider]  albuterol (PROVENTIL HFA;VENTOLIN HFA) 108 (90 Base) MCG/ACT inhaler Inhale 1-2 puffs into the lungs every 6 (six) hours as needed for wheezing or shortness of breath. Patient not taking: Reported on 02/25/2019 05/10/18   Mesner, Barbara Cower, MD  magic mouthwash w/lidocaine SOLN Take 10 mLs by mouth every 2 (two) hours as needed for mouth pain. Patient not taking: Reported on 10/15/2018 03/11/17   Bing Neighbors, FNP    Family History Family History  Problem Relation Age of Onset  . Hypertension Father     Social History Social History   Tobacco Use  . Smoking status: Former Smoker    Types: Cigarettes  . Smokeless tobacco: Never Used  Substance Use Topics  . Alcohol use: No  . Drug use: No     Allergies   Patient has no known allergies.   Review of Systems Review of Systems  Constitutional: Negative for chills and fever.  HENT: Negative for congestion, drooling and sore throat.   Eyes: Negative for visual disturbance.  Respiratory: Negative for chest tightness and shortness of breath.   Cardiovascular: Negative for chest pain.  Gastrointestinal: Negative for abdominal pain, diarrhea, nausea and vomiting.  Genitourinary: Negative for  dysuria.  Musculoskeletal: Negative for back pain.  Skin: Negative for color change and rash.  Neurological: Positive for dizziness, weakness and numbness (decreased sensation). Negative for seizures, speech difficulty, light-headedness and headaches.  Psychiatric/Behavioral: Negative for confusion.  All other systems reviewed and are negative.    Physical Exam Updated Vital Signs BP 116/74   Pulse 65   Temp 99 F (37.2 C) (Oral)   Resp 18   SpO2 100%   Physical Exam Vitals signs and nursing note reviewed.  Constitutional:      General: She is not in acute distress.     Appearance: She is well-developed. She is not ill-appearing.  HENT:     Head: Normocephalic and atraumatic.     Nose: Nose normal.     Mouth/Throat:     Mouth: Mucous membranes are moist.  Eyes:     Conjunctiva/sclera: Conjunctivae normal.  Neck:     Musculoskeletal: Normal range of motion and neck supple. No neck rigidity.  Cardiovascular:     Rate and Rhythm: Normal rate and regular rhythm.     Heart sounds: No murmur.  Pulmonary:     Effort: Pulmonary effort is normal. No respiratory distress.     Breath sounds: Normal breath sounds.  Abdominal:     Palpations: Abdomen is soft.     Tenderness: There is no abdominal tenderness.  Skin:    General: Skin is warm and dry.  Neurological:     Mental Status: She is alert and oriented to person, place, and time.     GCS: GCS eye subscore is 4. GCS verbal subscore is 5. GCS motor subscore is 6.     Cranial Nerves: Cranial nerves are intact.     Deep Tendon Reflexes:     Reflex Scores:      Patellar reflexes are 2+ on the right side and 2+ on the left side.    Comments: Mental Status:  Alert, oriented, thought content appropriate, able to give a coherent history. Speech fluent without evidence of aphasia. Able to follow 2 step commands without difficulty.  Cranial Nerves:  II:  Peripheral visual fields grossly normal, pupils equal, round, reactive to light III,IV, VI: ptosis not present, extra-ocular motions intact bilaterally  V,VII: smile symmetric, facial light touch sensation equal VIII: hearing grossly normal to voice  X: uvula elevates symmetrically  XI: bilateral shoulder shrug symmetric and strong XII: midline tongue extension without fassiculations Motor:  Normal tone.  5/5 strength right sided upper and lower extremity.  She has 4+/5 strength in the left-sided upper and lower extremity.  No pronator drift. Cerebellar: Mild tremor with left-sided finger-nose-finger.  Right-sided normal.  She has normal bilateral rapid  alternating movements.  She has intact heel to toe to knee.   CV: distal pulses palpable throughout  Subjective decreased sensation to light touch over the left ulnar forearm and third, fourth, and fifth fingers.  Negative left cubital tunnel Tinel's sign. She has subjective decreased sensation to light touch over the left-sided mid abdomen.  Normal sensation to light touch over bilateral lower extremities.   Psychiatric:        Mood and Affect: Mood normal.        Behavior: Behavior normal.      ED Treatments / Results  Labs (all labs ordered are listed, but only abnormal results are displayed) Labs Reviewed  BASIC METABOLIC PANEL - Abnormal; Notable for the following components:      Result Value   Potassium 3.4 (*)  Glucose, Bld 107 (*)    All other components within normal limits  URINALYSIS, ROUTINE W REFLEX MICROSCOPIC - Abnormal; Notable for the following components:   APPearance CLOUDY (*)    Hgb urine dipstick SMALL (*)    Bacteria, UA RARE (*)    All other components within normal limits  CBG MONITORING, ED - Abnormal; Notable for the following components:   Glucose-Capillary 108 (*)    All other components within normal limits  SARS CORONAVIRUS 2 (TAT 6-24 HRS)  CBC  I-STAT BETA HCG BLOOD, ED ([facility], [facility], [facility] ONLY)    EKG None  Radiology No results found.  Procedures Procedures (including critical care time)  Medications Ordered in ED Medications  sodium chloride flush (NS) 0.9 % injection 3 mL (3 mLs Intravenous Not Given 02/25/19 0646)  methylPREDNISolone sodium succinate (SOLU-MEDROL) 1,000 mg in sodium chloride 0.9 % 50 mL IVPB (has no administration in time range)  0.9 %  sodium chloride infusion ( Intravenous New Bag/Given 02/25/19 0631)     Initial Impression / Assessment and Plan / ED Course  I have reviewed the triage vital signs and the nursing notes.  Pertinent labs & imaging results that were available during my care of the patient were  reviewed by me and considered in my medical decision making (see chart for details).  Clinical Course as of Feb 24 657  Thu Feb 25, 2019  0611 Spoke with Dr. Kathrynn Speed of neurology, he recommends MRI brain with and without contrast.  If it is positive she will need admission.   [EH]  [phone] Spoke with Dr. Leonel Ramsay from neurology.  He reports that he thinks patient is having a MS flare and to go ahead and start her on 1 g of Solu-Medrol daily for 3 to 5 days and admit her into the hospital.   [EH]    Clinical Course User Index [EH] Lorin Glass, PA-C      ELAISHA ZAHNISER is a 23 year old woman with a past medical history of MS who presents today for evaluation of worsening dizziness, subjective decreased sensation to light touch, and weakness on the left side over the past 2 weeks.   Labs are obtained and reviewed, BMP and CBC without significant hematologic or electrolyte derangements.  She is not pregnant.  UA is contaminated with 11-20 squamous epithelial cells.  Given the presence of more epithelial cells and bacteria I do not suspect a true infection.  I spoke with Dr. Leonel Ramsay of neurology who recommends obtaining MRI brain with and without contrast.  After he evaluated her he states that she should be started on 1 g of Solu-Medrol daily for 3 to 5 days and be admitted into the hospital pending MRI as he suspects she is having a flare.  Patient remained hemodynamically stable while in my care.  Patient to be admitted.     Final Clinical Impressions(s) / ED Diagnoses   Final diagnoses:  Multiple sclerosis exacerbation [facility])    ED Discharge Orders    None       Lorin Glass, [place] 02/25/19 9798    Merryl Hacker, MD 02/25/19 [phone]

## 2019-02-25 NOTE — ED Notes (Signed)
Lunch tray ordered 

## 2019-02-25 NOTE — ED Notes (Signed)
Pt is still at MRI

## 2019-02-25 NOTE — ED Notes (Signed)
Pt returned to room from MRI.

## 2019-02-25 NOTE — H&P (Signed)
History and Physical    Kelly Benson XJO:[id number] DOB: 05/04/1996 DOA: 02/24/2019  PCP: Patient, No Pcp Per Consultants:  None Patient coming from:  Home - lives with husband; NOK: Husband, [phone]  Chief Complaint: L-sided numbness, weakness, tingling  HPI: Kelly Benson is a 23 y.o. female with medical history significant of multiple sclerosis not on maintenance medications presenting with left-sided numbness/weakness/tingling.  She has noticed weakness on the left side, especially the last 3 fingers.  Also with abdominal numbness and her feet are numb.  She has been extremely dizzy and weak.  The numbness started about 2 weeks ago, but the dizziness and weakness have been intermittent since her diagnosis 3 years ago.  She has never been on maintenance medications for her MS. She could not afford treatments previously.  Her insurance will start on Nov 17 and she will be ready to proceed with treatment then.   ED Course:  MS flare, neuro suggests Solumedrol, MRI.  Review of Systems: As per HPI; otherwise review of systems reviewed and negative.   Ambulatory Status:  Ambulates with a cane  Past Medical History:  Diagnosis Date  . MS (multiple sclerosis) (HCC)   . Scoliosis     History reviewed. No pertinent surgical history.  Social History   Socioeconomic History  . Marital status: Married    Spouse name: Not on file  . Number of children: Not on file  . Years of education: Not on file  . Highest education level: Not on file  Occupational History  . Occupation: Surveyor, minerals  Social Needs  . Financial resource strain: Not on file  . Food insecurity    Worry: Not on file    Inability: Not on file  . Transportation needs    Medical: Not on file    Non-medical: Not on file  Tobacco Use  . Smoking status: Never Smoker  . Smokeless tobacco: Never Used  Substance and Sexual Activity  . Alcohol use: No  . Drug use: No  .  Sexual activity: Yes    Birth control/protection: None  Lifestyle  . Physical activity    Days per week: Not on file    Minutes per session: Not on file  . Stress: Not on file  Relationships  . Social Musician on phone: Not on file    Gets together: Not on file    Attends religious service: Not on file    Active member of club or organization: Not on file    Attends meetings of clubs or organizations: Not on file    Relationship status: Not on file  . Intimate partner violence    Fear of current or ex partner: Not on file    Emotionally abused: Not on file    Physically abused: Not on file    Forced sexual activity: Not on file  Other Topics Concern  . Not on file  Social History Narrative  . Not on file    No Known Allergies  Family History  Problem Relation Age of Onset  . Hypertension Father   . Multiple sclerosis Neg Hx     Prior to Admission medications   Medication Sig Start Date End Date Taking? Authorizing Provider  B Complex-C (B-COMPLEX WITH VITAMIN C) tablet Take 1 tablet by mouth daily.   Yes [provider]  folic acid (FOLVITE) 1 MG tablet Take 1 mg by mouth daily.   Yes [provider]  omega-3 acid ethyl esters (LOVAZA) 1 g capsule Take 2 g by mouth daily.   Yes [provider]  albuterol (PROVENTIL HFA;VENTOLIN HFA) 108 (90 Base) MCG/ACT inhaler Inhale 1-2 puffs into the lungs every 6 (six) hours as needed for wheezing or shortness of breath. Patient not taking: Reported on 02/25/2019 05/10/18   Mesner, Barbara Cower, MD  magic mouthwash w/lidocaine SOLN Take 10 mLs by mouth every 2 (two) hours as needed for mouth pain. Patient not taking: Reported on 10/15/2018 03/11/17   Bing Neighbors, FNP    Physical Exam: Vitals:   02/25/19 0416 02/25/19 0623 02/25/19 0624 02/25/19 0630  BP: 116/82 103/87  116/74  Pulse: 66  65   Resp: 18     Temp:      TempSrc:      SpO2: 100%  100%      . General:  Appears calm and  comfortable and is NAD . Eyes:  PERRL, EOMI, normal lids, iris . ENT:  grossly normal hearing, lips & tongue, mmm; appropriate dentition . Neck:  no LAD, masses or thyromegaly . Cardiovascular:  RRR, no m/r/g. No LE edema.  Marland Kitchen Respiratory:   CTA bilaterally with no wheezes/rales/rhonchi.  Normal respiratory effort. . Abdomen:  soft, NT, ND, NABS . Skin:  no rash or induration seen on limited exam . Musculoskeletal:  grossly normal tone BUE/BLE with 4-5/5 strength of LUE < LLE, good ROM, no bony abnormality . Lower extremity:  No LE edema.  Limited foot exam with no ulcerations.  2+ distal pulses. Marland Kitchen Psychiatric:  grossly normal mood and affect, speech fluent and appropriate, AOx3 . Neurologic:  CN 2-12 grossly intact, moves all extremities in coordinated fashion with mildly decreased strength on L as above, sensation intact on face but impaired along L arm and leg with increased sensations along the extremities with fine touch    Radiological Exams on Admission: No results found.  EKG: Independently reviewed.  NSR with rate 80; no evidence of acute ischemia   Labs on Admission: I have personally reviewed the available labs and imaging studies at the time of the admission.  Pertinent labs:   K+ 3.4 Glucose 107 Normal CBC HCG < 5 UA: small Hgb, rare bacteria COVID pending   Assessment/Plan Active Problems:   Multiple sclerosis exacerbation (HCC)   MS flare -Patient with probable recurrent MS - presenting with left-sided sensory and motor deficits along with dizziness and generalized weakness -MRI of brain and C-spine are pending -Will admit to Med Surg -Will treat with 1 gram Solumedrol IV daily x 5 days -Needs empiric PPI while on steroids -Neurology consultation is appreciated -Physical/occupational therapy consults.   -She has never been on disease-modifying therapy (reportedly due to insurance) and absolutely needs to start -Dr. Amada Jupiter is working to ensure access to  outpatient neurologic care -CM consult for medication assistance   Note: This patient has been tested and is pending for the novel coronavirus COVID-19.   DVT prophylaxis:   Lovenox Code Status:  Full  Family Communication: None present  Disposition Plan:  Home once clinically improved Consults called: Neurology; CM/PT/OT  Admission status: Admit - It is my clinical opinion that admission to INPATIENT is reasonable and necessary because of the expectation that this patient will require hospital care that crosses at least 2 midnights to treat this condition based on the medical complexity of the problems presented.  Given the aforementioned information, the predictability of an adverse outcome is felt to be significant.  Karmen Bongo MD [facility]   How to contact the [facility] Attending or Consulting provider [place] or covering provider during after hours [place], for this patient?  1. Check the care team in [facility] and look for a) attending/consulting [facility] provider listed and b) the [facility] team listed 2. Log into [email or web address] and use [place] universal password to access. If you do not have the password, please contact the hospital operator. 3. Locate the [facility] provider you are looking for under [facility] and page to a number that you can be directly reached. 4. If you still have difficulty reaching the provider, please page the [facility] (Director on Call) for the Hospitalists listed on amion for assistance.   02/25/2019, 7:56 AM

## 2019-02-25 NOTE — Progress Notes (Addendum)
Patient has arrived to 3w. A&O x4, states no pain. Weakness of left side, walks with cane at baseline. Numbness on three fingers on left hand. Left leg weak with decreased sensation. Clothing, cell phone, jewelry (rings) at bedside. Call bell within reach and explained how to use. Nurse will continue to monitor. [place]

## 2019-02-25 NOTE — Consult Note (Signed)
Neurology Consultation Reason for Consult: Left-sided weakness and numbness Referring Physician: Horton, C  CC: Left-sided weakness and numbness  History is obtained from: Patient  HPI: Kelly Benson is a 23 y.o. female with a history of MS who is not taking any type of prophylactic therapy due to lack of insurance who presents with left-sided weakness and numbness worsening over the past 2 weeks.  She states that she has had difficulty with walking over the past few days and is having trouble managing at work and is for that reason that she is presenting to the emergency department.  At home, she is having some difficulty with her activities of daily living including getting dressed.  She has never had left-sided numbness and weakness before.  She states that there is tingling as well.  She has been seen here multiple times for MS flares, on her last discharge, she followed up with [facility] and was referred to an MS specialist at [facility].  Unfortunately, there was some issue with transfer of her records and she never managed follow-up.  She does not have insurance, complicating follow-up care.  She states that she should have insurance starting in November.  She denies fever, chills, dysuria, nausea vomiting, or any other symptoms of illness.    ROS: A 14 point ROS was performed and is negative except as noted in the HPI.   Past Medical History:  Diagnosis Date  . Anginal pain (HCC)   . Dyspnea   . Headache   . MS (multiple sclerosis) (HCC)   . Scoliosis      Family History  Problem Relation Age of Onset  . Hypertension Father      Social History:  reports that she has quit smoking. Her smoking use included cigarettes. She has never used smokeless tobacco. She reports that she does not drink alcohol or use drugs.   Exam: Current vital signs: BP 116/74   Pulse 65   Temp 99 F (37.2 C) (Oral)   Resp 18   SpO2 100%  Vital signs in last 24 hours: Temp:  [99  F (37.2 C)] 99 F (37.2 C) (10/14 2147) Pulse Rate:  [65-98] 65 (10/15 0624) Resp:  [18] 18 (10/15 0416) BP: (103-131)/(74-103) 116/74 (10/15 0630) SpO2:  [97 %-100 %] 100 % (10/15 0865)   Physical Exam  Constitutional: Appears well-developed and well-nourished.  Psych: Affect appropriate to situation Eyes: No scleral injection HENT: No OP obstrucion Head: Normocephalic.  Cardiovascular: Normal rate and regular rhythm.  Respiratory: Effort normal, non-labored breathing GI: Soft.  No distension. There is no tenderness.  Skin: WDI  Neuro: Mental Status: Patient is awake, alert, oriented to person, place, month, year, and situation. Patient is able to give a clear and coherent history. No signs of aphasia or neglect Cranial Nerves: II: Visual Fields are full. Pupils are equal, round, and reactive to light.   III,IV, VI: EOMI without ptosis or diploplia.  V: Facial sensation is diminished on the left VII: Facial movement is symmetric.  VIII: hearing is intact to voice X: Uvula elevates symmetrically XI: Shoulder shrug is symmetric. XII: tongue is midline without atrophy or fasciculations.  Motor: Tone is normal. Bulk is normal. 5/5 strength was present on the right side, on the left she has 4/5 weakness of the left arm and leg with drift Sensory: Sensation is diminished in a patchy distribution on the left side Cerebellar: She has ataxia on finger-nose-finger bilaterally.    I have reviewed labs  in epic and the results pertinent to this consultation are: Beta-hCG negative BMP-unremarkable  I have reviewed the images obtained: Previous MRIs reviewed with multifocal demyelinating disease consistent with MS  Impression: 23 year old female with multiple sclerosis not on prophylactic treatment who presents with worsening left-sided weakness consistent with MS flare.  I would favor going ahead and starting her on IV Solu-Medrol 1 g daily for 3 to 5 days depending on  response.  Recommendations: 1) IV Solu-Medrol 1 g daily for 3 to 5 days 2) MRI brain and cervical spine with/without contrast 3) she will need outpatient neurology follow-up for disease modifying therapy.   Roland Rack, MD [facility] [phone]  If 7pm- 7am, please page neurology on call as listed in [place].

## 2019-02-25 NOTE — ED Notes (Addendum)
Patient transported to MRI 

## 2019-02-25 NOTE — ED Notes (Signed)
Admitting physician at bedside

## 2019-02-26 DIAGNOSIS — T380X5A Adverse effect of glucocorticoids and synthetic analogues, initial encounter: Secondary | ICD-10-CM

## 2019-02-26 DIAGNOSIS — E876 Hypokalemia: Secondary | ICD-10-CM

## 2019-02-26 DIAGNOSIS — R739 Hyperglycemia, unspecified: Secondary | ICD-10-CM

## 2019-02-26 LAB — BASIC METABOLIC PANEL
Anion gap: 8 (ref 5–15)
BUN: 14 mg/dL (ref 6–20)
CO2: 21 mmol/L — ABNORMAL LOW (ref 22–32)
Calcium: 9.5 mg/dL (ref 8.9–10.3)
Chloride: 107 mmol/L (ref 98–111)
Creatinine, Ser: 0.56 mg/dL (ref 0.44–1.00)
GFR calc Af Amer: >60 mL/min (ref >60)
GFR calc non Af Amer: >60 mL/min (ref >60)
Glucose, Bld: 154 mg/dL — ABNORMAL HIGH (ref 70–99)
Potassium: 3.7 mmol/L (ref 3.5–5.1)
Sodium: 136 mmol/L (ref 135–145)

## 2019-02-26 LAB — CBC
HCT: 37.7 % (ref 36.0–46.0)
Hemoglobin: 12.6 g/dL (ref 12.0–15.0)
MCH: 27.7 pg (ref 26.0–34.0)
MCHC: 33.4 g/dL (ref 30.0–36.0)
MCV: 82.9 fL (ref 80.0–100.0)
Platelets: 353 10*3/uL (ref 150–400)
RBC: 4.55 MIL/uL (ref 3.87–5.11)
RDW: 13.4 % (ref 11.5–15.5)
WBC: 16 10*3/uL — ABNORMAL HIGH (ref 4.0–10.5)
nRBC: 0 % (ref 0.0–0.2)

## 2019-02-26 NOTE — Evaluation (Signed)
Occupational Therapy Evaluation Patient Details Name: Kelly Benson MRN: [id number] DOB: 01/23/1996 Today's Date: 02/26/2019    History of Present Illness Patient is a 23 year old female whith MS who was admitted with numbness and tingling on left side. Left 3 fingers, abdomen, Left foot.   Clinical Impression   Pt admitted with the above diagnosis and has the deficits listed below. Pt is most limited by LUE and LLE weakness and decreased sensation/proprioception in these limbs.  Pt overall can complete basic adls but struggles greatly with UE dressing/bathing b/c L side is so weak.  Feel she would benefit from cont OT to increase ease in doing these adls and increase strength and coordination in LUE to make adls more efficient.  Feel pt would greatly benefit from OPOT but understanding she does not have insurance until next month.  Home exercise program given but follow up OT necessary.  Spoke with pt at length about driving as well and things she can think about to make driving easier due to weakness in her arms.  Will follow closely in acute as unsure what status US after acute with follow up therapy.     Follow Up Recommendations  Outpatient OT;Supervision - Intermittent    Equipment Recommendations  3 in 1 bedside commode;Tub/shower seat    Recommendations for Other Services       Precautions / Restrictions Precautions Precautions: Fall Precaution Comments: insteady with ambulation Restrictions Weight Bearing Restrictions: No      Mobility Bed Mobility Overal bed mobility: Modified Independent             General bed mobility comments: no physical assist needed.  Just extra time.  Transfers Overall transfer level: Modified independent Equipment used: Rolling walker (2 wheeled)             General transfer comment: Slow moving but safe.    Balance Overall balance assessment: Needs assistance Sitting-balance support: Feet supported Sitting balance-Leahy  Scale: Normal     Standing balance support: Bilateral upper extremity supported;During functional activity Standing balance-Leahy Scale: Fair Standing balance comment: improved balance with B UE support.                           ADL either performed or assessed with clinical judgement   ADL Overall ADL's : Needs assistance/impaired Eating/Feeding: Independent;Sitting Eating/Feeding Details (indicate cue type and reason): struggles at times cutting and using LUE to assist but does not require assist. Grooming: Wash/dry hands;Wash/dry face;Oral care;Supervision/safety;Standing Grooming Details (indicate cue type and reason): Pt able to complete the tasks through compensation but has L hand weakness that makes things more difficult. Upper Body Bathing: Minimal assistance;Sitting Upper Body Bathing Details (indicate cue type and reason): Pt requires assist at times when using LUE to wash the RUE.  Pt may benefit from a was mitt of some sort.  Lower Body Bathing: Minimal assistance;Sit to/from stand Lower Body Bathing Details (indicate cue type and reason): assist needed for balance when in standing. Upper Body Dressing : Minimal assistance;Sitting Upper Body Dressing Details (indicate cue type and reason): Pt would benefit from being taught some hemi dressing techniques just to make things easier.  Also thought instruction about how to donn a bra and easier bras to purchase. Lower Body Dressing: Minimal assistance;Sit to/from stand Lower Body Dressing Details (indicate cue type and reason): assist for balance in standing when not holding to the walker. Toilet Transfer: Hotel manager Details (indicate  cue type and reason): Pt walked to bathroom with walker.  Pt able to toilet without physical assist.  Heavily dependent on the rail.  Would benefit from 3:1 over commode. Toileting- Architect and Hygiene: Min guard;Sit to/from stand        Functional mobility during ADLs: Supervision/safety General ADL Comments: Pt most limited when having to heavily depend on LUE.     Vision Baseline Vision/History: No visual deficits Patient Visual Report: No change from baseline Vision Assessment?: No apparent visual deficits     Perception Perception Perception Tested?: No   Praxis Praxis Praxis tested?: Within functional limits    Pertinent Vitals/Pain Pain Assessment: No/denies pain     Hand Dominance Right   Extremity/Trunk Assessment Upper Extremity Assessment Upper Extremity Assessment: RUE deficits/detail;LUE deficits/detail RUE Deficits / Details: AROM WNL.  Strength:  generally 4/5 throughout RUE Sensation: WNL RUE Coordination: WNL LUE Deficits / Details: AROM WNL  Strength: shoulder 4-/5, biceps 4/5, triceps 3+/5, grip 3+/5 LUE Sensation: decreased light touch;decreased proprioception LUE Coordination: decreased fine motor   Lower Extremity Assessment Lower Extremity Assessment: Defer to PT evaluation LLE Deficits / Details: numbness on bottom of foot. LLE Sensation: decreased light touch   Cervical / Trunk Assessment Cervical / Trunk Assessment: Normal   Communication Communication Communication: No difficulties   Cognition Arousal/Alertness: Awake/alert Behavior During Therapy: WFL for tasks assessed/performed Overall Cognitive Status: Within Functional Limits for tasks assessed                                 General Comments: Pt does state she has started to have some difficulties with prcessing a lot of instructions from her supervisor at one time.  Memory appears intact.    General Comments  Pt most limited by poor balance on her feet and LUE weakness during adls. 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  Both jobs are difficult with these deficits.    Exercises Exercises: Other exercises Other Exercises Other Exercises: Pt provided with hand exerciser and theraputty  exercises.   Shoulder Instructions      Home Living Family/patient expects to be discharged to:: Private residence Living Arrangements: Spouse/significant other Available Help at Discharge: Family Type of Home: Apartment Home Access: Stairs to enter Secretary/administrator of Steps: 1 flight Entrance Stairs-Rails: Right Home Layout: One level     Bathroom Shower/Tub: Tub/shower unit;Door   [employer] Toilet: Standard     Home Equipment: Gilmer Mor - single point          Prior Functioning/Environment Level of Independence: Independent with assistive device(s)        Comments: works 2 jobs, Production designer, 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 Problem List: Decreased strength;Impaired balance (sitting and/or standing);Decreased coordination;Decreased knowledge of use of DME or AE;Impaired sensation;Impaired UE functional use      OT Treatment/Interventions: Self-care/ADL training;Neuromuscular education;Therapeutic exercise;DME and/or AE instruction;Energy conservation;Therapeutic activities;Balance training    OT Goals(Current goals can be found in the care plan section) Acute Rehab OT Goals Patient Stated Goal: to improve, OT Goal Formulation: With patient Time For Goal Achievement: 03/12/19 Potential to Achieve Goals: Good ADL Goals Pt/caregiver will Perform Home Exercise Program: With theraputty;Left upper extremity;Independently;With written HEP provided Additional ADL Goal #2: Pt will be educated in hemi dressing techniques so she can more easily donn bras/clothes on days the LUE feels heavy and weak and will dress with mod  I. Additional ADL Goal #3: Pt wll walk to bathroom and toilet with 3:1 over commode with mod I. Additional ADL Goal #4: Pt will bathe self with mod I sitting in shower on seat with wash mitt if needed.  OT Frequency: Min 3X/week   Barriers to D/C: Decreased caregiver support  she and her husband both work       Co-evaluation               AM-PAC OT "6 Clicks" Daily Activity     Outcome Measure Help from another person eating meals?: None Help from another person taking care of personal grooming?: None Help from another person toileting, which includes using toliet, bedpan, or urinal?: None Help from another person bathing (including washing, rinsing, drying)?: A Little Help from another person to put on and taking off regular upper body clothing?: A Little Help from another person to put on and taking off regular lower body clothing?: A Little 6 Click Score: 21   End of Session Equipment Utilized During Treatment: Rolling walker Nurse Communication: Mobility status  Activity Tolerance: Patient tolerated treatment well Patient left: in bed;with call bell/phone within reach  OT Visit Diagnosis: Unsteadiness on feet (R26.81);Muscle weakness (generalized) (M62.81);Other symptoms and signs involving the nervous system (R29.898);Hemiplegia and hemiparesis Hemiplegia - Right/Left: Left Hemiplegia - dominant/non-dominant: Non-Dominant 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

## 2019-02-26 NOTE — Progress Notes (Signed)
PROGRESS NOTE    Kelly Benson   TMH:[id number] DOB: August 03, 1995 DOA: 02/24/2019  Admitted from: Home PCP: Patient, No Pcp Per   Hospital Summary  This is a 23 year old female with past medical history of multiple sclerosis not on any medications due to previous lack of insurance who presented with left hand abdomen and leg numbness, weakness, paresthesias.  Symptoms started about 2 weeks ago but have begun worsening.  Her left hand symptoms are new from previous and has not had any signs of infection recently.  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  She has since been evaluated by neurology.  A & P   Active Problems:   Multiple sclerosis exacerbation (HCC)   Multiple sclerosis flare unfortunately she has not been able to receive disease modifying therapy since diagnosed 3 years ago due to lack of insurance.  Likely true MS flare as no recent febrile illness or other signs of infection as well as new findings including left hand paresthesias and abdominal paresthesias.  Patient reported she had an episode of bladder incontinence.  This was discussed with neurology who did not recommend lumbar MRI as it would not change management since this is from her MS. Insurance is to start November 17 and would be ready to proceed with treatment at that time.    Continue Solu-Medrol 1 g daily for 3 to 5 days (today is day 2)  Will need outpatient neuro follow-up for disease modifying therapy  OT/PT  Neurology is trying to find out if there is anything they can do to expedite patient receiving disease modifying therapy and if not we will start work on getting approval for these agents while in the  hospital  DVT prophylaxis: Lovenox   Code Status: Full Code  Diet: Regular Family Communication: Offered to call patient's family to update but she said that she would update them personally  Disposition Plan: Working with neurology and case management to see if the patient will be able to receive disease modifying therapy during his hospital stay or as soon as possible as she has been without any treatment since diagnosis 3 years ago and her disease is progressing  Consultants   Neurology  Procedures   MRI       Subjective   Patient seen and examined at bedside no acute distress and resting comfortably.  She states that she was in her usual state of health until around 2 weeks ago when she began having paresthesias in her left hand and left side and was trying to ignore this but it became worse with some weakness and realize she needed to be evaluated.  She states she did have an episode of bladder incontinence several days ago when she woke up with urine has not had this since and does not have any back pain.  She states she was diagnosed 3 years ago after having aphasia while at work on the phone.  She ports she has had intermittent flareups since then.  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  Currently admits to numbness and tingling along lateral left leg, left flank and left palm.  Otherwise denies any other complaints  Objective   Vitals:   02/26/19 0445 02/26/19 0727 02/26/19 1231 02/26/19 1657  BP: 127/69 115/73 113/64 117/71  Pulse: (!) 105 86 76 98  Resp: 17 16 20 20   Temp: 97.7 F (36.5 C) (!) 97.5 F (36.4 C) 98.4 F (36.9 C) 98 F (36.7 C)  TempSrc: Oral Oral Oral Oral  SpO2: 97% 98% 99% 98%   No intake or output data in the 24 hours ending 02/26/19 1906 There were no vitals filed for this visit.  Examination:  Physical Exam Vitals signs and nursing note reviewed.  Constitutional:      General: She is not in  acute distress.    Appearance: Normal appearance.  HENT:     Head: Normocephalic and atraumatic.     Nose: Nose normal.     Mouth/Throat:     Mouth: Mucous membranes are moist.  Eyes:     Extraocular Movements: Extraocular movements intact.  Neck:     Musculoskeletal: Normal range of motion. No neck rigidity.  Cardiovascular:     Rate and Rhythm: Normal rate and regular rhythm.  Pulmonary:     Effort: Pulmonary effort is normal.     Breath sounds: Normal breath sounds.  Abdominal:     General: Abdomen is flat.     Palpations: Abdomen is soft.     Comments: Decreased sensation along left leg  Musculoskeletal: Normal range of motion.        General: No swelling.  Neurological:     Mental Status: She is alert.     Comments: Weak left hand grip  Psychiatric:        Mood and Affect: Mood normal.        Behavior: Behavior normal.     Data Reviewed: I have personally reviewed following labs and imaging studies  CBC: Recent Labs  Lab 02/24/19 2251 02/26/19 0413  WBC 8.1 16.0*  HGB 12.7 12.6  HCT 38.6 37.7  MCV 84.5 82.9  PLT 343 353   Basic Metabolic Panel: Recent Labs  Lab 02/24/19 2251 02/26/19 0413  NA 139 136  K 3.4* 3.7  CL 108 107  CO2 22 21*  GLUCOSE 107* 154*  BUN 12 14  CREATININE 0.62 0.56  CALCIUM 9.0 9.5   GFR: CrCl cannot be calculated (Unknown ideal weight.). Liver Function Tests: No results for input(s): AST, ALT, ALKPHOS, BILITOT, PROT, ALBUMIN in the last 168 hours. No results for input(s): LIPASE, AMYLASE in the last 168 hours. No results for input(s): AMMONIA in the last 168 hours. Coagulation Profile: No results for input(s): INR, PROTIME in the last 168 hours. Cardiac Enzymes: No results for input(s): CKTOTAL, CKMB, CKMBINDEX, TROPONINI in the last 168 hours. BNP (last 3 results) No results for input(s): PROBNP in the last 8760 hours. HbA1C: No results for input(s): HGBA1C in the last 72 hours. CBG: Recent Labs  Lab 02/24/19 2249   GLUCAP 108*   Lipid Profile: No results for input(s): CHOL, HDL, LDLCALC, TRIG, CHOLHDL, LDLDIRECT in the last 72 hours. Thyroid Function Tests: No results for input(s): TSH, T4TOTAL, FREET4, T3FREE, THYROIDAB in the last 72 hours. Anemia Panel: No results for input(s): VITAMINB12, FOLATE, FERRITIN, TIBC, IRON, RETICCTPCT in the last 72 hours. Sepsis Labs: No results for input(s): PROCALCITON, LATICACIDVEN in the last 168 hours.  Recent Results (from the past 240 hour(s))  SARS CORONAVIRUS 2 (TAT 6-24 HRS) Nasopharyngeal Nasopharyngeal Swab     Status: None   Collection Time: 02/25/19  8:35 AM   Specimen:  Nasopharyngeal Swab  Result Value Ref Range Status   SARS Coronavirus 2 NEGATIVE NEGATIVE Final    Comment: (NOTE) SARS-CoV-2 target nucleic acids are NOT DETECTED. The SARS-CoV-2 RNA is generally detectable in upper and lower respiratory specimens during the acute phase of infection. Negative results do not preclude SARS-CoV-2 infection, do not rule out co-infections with other pathogens, and should not be used as the sole basis for treatment or other patient management decisions. Negative results must be combined with clinical observations, patient history, and epidemiological information. The expected result is Negative. Fact Sheet for Patients: HairSlick.nohttps://www.fda.gov/media/138098/download Fact Sheet for Healthcare Providers: quierodirigir.comhttps://www.fda.gov/media/138095/download This test is not yet approved or cleared by the [place] FDA and  has been authorized for detection and/or diagnosis of SARS-CoV-2 by FDA under an Emergency Use Authorization (EUA). This EUA will remain  in effect (meaning this test can be used) for the duration of the COVID-19 declaration under Section 56 4(b)(1) of the Act, 21 U.S.C. section 360bbb-3(b)(1), unless the authorization is terminated or revoked sooner. Performed at [facility], [address] [place], [place] [address]           Radiology Studies: Mr Laqueta JeanBrain W And Wo Contrast  Result Date: 02/25/2019 CLINICAL DATA:  Multiple sclerosis with new neurologic event EXAM: MRI HEAD WITHOUT AND WITH CONTRAST MRI CERVICAL SPINE WITHOUT AND WITH CONTRAST TECHNIQUE: Multiplanar, multiecho pulse sequences of the brain and surrounding structures, and cervical spine, to include the craniocervical junction and cervicothoracic junction, were obtained without and with intravenous contrast. CONTRAST:  10mL GADAVIST GADOBUTROL 1 MMOL/ML IV SOLN COMPARISON:  10/15/2018 FINDINGS: MRI HEAD FINDINGS Brain: Numerous demyelinating plaques in the periventricular and juxta cortical cerebral white matter with unchanged extent and distribution when compared to prior. Mild infratentorial disease with 2 small FLAIR hyperintensities in the ventral right mid brain and pons on sagittal FLAIR imaging. No restricted diffusion when accounting for shine through. No abnormal enhancement. No incidental infarct, hemorrhage, hydrocephalus, or mass. Brain volume remains normal. Vascular: Normal flow voids and vascular enhancement Skull and upper cervical spine: No focal marrow lesion Sinuses/Orbits: Negative MRI CERVICAL SPINE FINDINGS Alignment: Positional reversal of cervical lordosis. Vertebrae: C2-3 incomplete segmentation. No bone lesion or fracture. Cord: Patchy short segment cord signal abnormalities between C2 and C6 on sagittal fluid sensitive imaging that appears stable. There is new left lateral enhancing cord plaque at C2 and right lateral cord plaque at C3-4. On sagittal postcontrast imaging there may be a small dorsal cord enhancing plaque at C5-6. Posterior Fossa, vertebral arteries, paraspinal tissues: Negative Disc levels: No significant degenerative changes and no neural impingement IMPRESSION: Brain MRI: Multiple sclerosis with no evidence of acute or interval demyelination. Cervical MRI: 2 or 3 enhancing plaques in the lateral and dorsal cord that  are interval from June 2020 and compatible with active demyelination. 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 Electronically Signed   By: Marnee SpringJonathon  Watts M.D.   On: 02/25/2019 11:09   Mr Cervical Spine W Wo Contrast  Result Date: 02/25/2019 CLINICAL DATA:  Multiple sclerosis with new neurologic event EXAM: MRI HEAD WITHOUT AND WITH CONTRAST MRI CERVICAL SPINE WITHOUT AND WITH CONTRAST TECHNIQUE: Multiplanar, multiecho pulse sequences of the brain and surrounding structures, and cervical spine, to include the craniocervical junction and cervicothoracic junction, were obtained without and with intravenous contrast. CONTRAST:  10mL GADAVIST GADOBUTROL 1 MMOL/ML IV SOLN COMPARISON:  10/15/2018 FINDINGS: MRI HEAD FINDINGS Brain: Numerous demyelinating plaques in the periventricular and  juxta cortical cerebral white matter with unchanged extent and distribution when compared to prior. Mild infratentorial disease with 2 small FLAIR hyperintensities in the ventral right mid brain and pons on sagittal FLAIR imaging. No restricted diffusion when accounting for shine through. No abnormal enhancement. No incidental infarct, hemorrhage, hydrocephalus, or mass. Brain volume remains normal. Vascular: Normal flow voids and vascular enhancement Skull and upper cervical spine: No focal marrow lesion Sinuses/Orbits: Negative MRI CERVICAL SPINE FINDINGS Alignment: Positional reversal of cervical lordosis. Vertebrae: C2-3 incomplete segmentation. No bone lesion or fracture. Cord: Patchy short segment cord signal abnormalities between C2 and C6 on sagittal fluid sensitive imaging that appears stable. There is new left lateral enhancing cord plaque at C2 and right lateral cord plaque at C3-4. On sagittal postcontrast imaging there may be a small dorsal cord enhancing plaque at C5-6. Posterior Fossa, vertebral arteries, paraspinal tissues: Negative Disc levels: No significant degenerative changes  and no neural impingement IMPRESSION: Brain MRI: Multiple sclerosis with no evidence of acute or interval demyelination. Cervical MRI: 2 or 3 enhancing plaques in the lateral and dorsal cord that are interval from June 2020 and compatible with active demyelination. 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 Electronically Signed   By: Marnee Spring M.D.   On: 02/25/2019 11:09        Scheduled Meds:  B-complex with vitamin C  1 tablet Oral Daily   enoxaparin (LOVENOX) injection  40 mg Subcutaneous Q24H   folic acid  1 mg Oral Daily   pantoprazole  40 mg Oral Daily   Continuous Infusions:  methylPREDNISolone (SOLU-MEDROL) injection 1,000 mg (02/26/19 1134)     LOS: 1 day    Time spent: 35 minutes    Jae Dire, DO [facility] Pager [phone]  If 7PM-7AM, please contact night-coverage [email or web address] Password [email or web address] 02/26/2019, 7:06 PM

## 2019-02-26 NOTE — Progress Notes (Addendum)
NEURO HOSPITALIST PROGRESS NOTE   Subjective: Patient pleasant awake, alert, NAD. Does want medication for when she leaves hospital to bridge her until her insurance starts November 17th and she can schedule for a neurology appointment. Patient walks with cane at baseline   Exam: Vitals:   02/26/19 0445 02/26/19 0727  BP: 127/69 115/73  Pulse: (!) 105 86  Resp: 17 16  Temp: 97.7 F (36.5 C) (!) 97.5 F (36.4 C)  SpO2: 97% 98%    Physical Exam   HEENT-  Normocephalic, no lesions, without obvious abnormality.  Normal external eye and conjunctiva.   Cardiovascular- S1-S2 audible, pulses palpable throughout   Lungs-no rhonchi or wheezing noted, no excessive working breathing.  Saturations within normal limits Abdomen- All 4 quadrants palpated and nontender Extremities- Warm, dry and intact Musculoskeletal-no joint tenderness, deformity or swelling Skin-warm and dry, no hyperpigmentation, vitiligo, or suspicious lesions   Neuro:  Mental Status: Alert, oriented, thought content appropriate.  Speech fluent without evidence of aphasia.  Able to follow  commands without difficulty. Cranial Nerves: II:  Visual fields grossly normal,  III,IV, VI: ptosis not present, extra-ocular motions intact bilaterally pupils equal, round, reactive to light and accommodation V,VII: smile symmetric, facial light touch sensation normal bilaterally VIII: hearing normal bilaterally IX,X: uvula rises symmetrically XI: bilateral shoulder shrug XII: midline tongue extension Motor: Right : Upper extremity   5/5   Left:     Upper extremity   4/5  Lower extremity   5/5    Lower extremity   4/5 with left leg drift Tone and bulk:normal tone throughout; no atrophy noted Sensory:  light touch intact throughout, bilaterally Cerebellar: Slight ataxia on FNF bilaterally  Gait: deferred    Medications:  Scheduled: . B-complex with vitamin C  1 tablet Oral Daily  . enoxaparin (LOVENOX)  injection  40 mg Subcutaneous Q24H  . folic acid  1 mg Oral Daily  . pantoprazole  40 mg Oral Daily   Continuous: . methylPREDNISolone (SOLU-MEDROL) injection Stopped (02/25/19 0841)   KWI:[id number] **OR** acetaminophen, ondansetron **OR** ondansetron (ZOFRAN) IV  Pertinent Labs/Diagnostics:   Mr Jeri Cos And Wo Contrast  Result Date: 02/25/2019 CLINICAL DATA:  Multiple sclerosis with new neurologic event EXAM: MRI HEAD WITHOUT AND WITH CONTRAST MRI CERVICAL SPINE WITHOUT AND WITH CONTRAST TECHNIQUE: Multiplanar, multiecho pulse sequences of the brain and surrounding structures, and cervical spine, to include the craniocervical junction and cervicothoracic junction, were obtained without and with intravenous contrast. CONTRAST:  19mL GADAVIST GADOBUTROL 1 MMOL/ML IV SOLN COMPARISON:  10/15/2018 FINDINGS: MRI HEAD FINDINGS Brain: Numerous demyelinating plaques in the periventricular and juxta cortical cerebral white matter with unchanged extent and distribution when compared to prior. Mild infratentorial disease with 2 small FLAIR hyperintensities in the ventral right mid brain and pons on sagittal FLAIR imaging. No restricted diffusion when accounting for shine through. No abnormal enhancement. No incidental infarct, hemorrhage, hydrocephalus, or mass. Brain volume remains normal. Vascular: Normal flow voids and vascular enhancement Skull and upper cervical spine: No focal marrow lesion Sinuses/Orbits: Negative MRI CERVICAL SPINE FINDINGS Alignment: Positional reversal of cervical lordosis. Vertebrae: C2-3 incomplete segmentation. No bone lesion or fracture. Cord: Patchy short segment cord signal abnormalities between C2 and C6 on sagittal fluid sensitive imaging that appears stable. There is new left lateral enhancing cord plaque at C2 and right lateral cord plaque at C3-4. On sagittal postcontrast imaging  there may be a small dorsal cord enhancing plaque at C5-6. Posterior Fossa, vertebral  arteries, paraspinal tissues: Negative Disc levels: No significant degenerative changes and no neural impingement IMPRESSION: Brain MRI: Multiple sclerosis with no evidence of acute or interval demyelination. Cervical MRI: 2 or 3 enhancing plaques in the lateral and dorsal cord that are interval from June 2020 and compatible with active demyelination. 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 Electronically Signed   By: Marnee Spring M.D.   On: 02/25/2019 11:09   Mr Cervical Spine W Wo Contrast  Result Date: 02/25/2019 CLINICAL DATA:  Multiple sclerosis with new neurologic event EXAM: MRI HEAD WITHOUT AND WITH CONTRAST MRI CERVICAL SPINE WITHOUT AND WITH CONTRAST TECHNIQUE: Multiplanar, multiecho pulse sequences of the brain and surrounding structures, and cervical spine, to include the craniocervical junction and cervicothoracic junction, were obtained without and with intravenous contrast. CONTRAST:  65mL GADAVIST GADOBUTROL 1 MMOL/ML IV SOLN COMPARISON:  10/15/2018 FINDINGS: MRI HEAD FINDINGS Brain: Numerous demyelinating plaques in the periventricular and juxta cortical cerebral white matter with unchanged extent and distribution when compared to prior. Mild infratentorial disease with 2 small FLAIR hyperintensities in the ventral right mid brain and pons on sagittal FLAIR imaging. No restricted diffusion when accounting for shine through. No abnormal enhancement. No incidental infarct, hemorrhage, hydrocephalus, or mass. Brain volume remains normal. Vascular: Normal flow voids and vascular enhancement Skull and upper cervical spine: No focal marrow lesion Sinuses/Orbits: Negative MRI CERVICAL SPINE FINDINGS Alignment: Positional reversal of cervical lordosis. Vertebrae: C2-3 incomplete segmentation. No bone lesion or fracture. Cord: Patchy short segment cord signal abnormalities between C2 and C6 on sagittal fluid sensitive imaging that appears stable. There is new  left lateral enhancing cord plaque at C2 and right lateral cord plaque at C3-4. On sagittal postcontrast imaging there may be a small dorsal cord enhancing plaque at C5-6. Posterior Fossa, vertebral arteries, paraspinal tissues: Negative Disc levels: No significant degenerative changes and no neural impingement IMPRESSION: Brain MRI: Multiple sclerosis with no evidence of acute or interval demyelination. Cervical MRI: 2 or 3 enhancing plaques in the lateral and dorsal cord that are interval from June 2020 and compatible with active demyelination. 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 Electronically Signed   By: Marnee Spring M.D.   On: 02/25/2019 11:09   Assessment:  Impression: 23 year old female with multiple sclerosis not on prophylactic treatment who presents with worsening left-sided weakness consistent with MS flare.  I would favor going ahead and starting her on IV Solu-Medrol 1 g daily for 3 to 5 days depending on response. MRI brain: MS with no evidence of acute or interval demyelination. MRI C-spine: 2 or 3 enhancing plaques in lateral and dorsal cord compatible with active demyelination.   MS flare  Recommendations: -- continue IV Solu-Medrol 1 g daily for 3 to 5 days ( dose 2 today) -- she will need outpatient neurology follow-up for disease modifying therapy. -OT/PT --neurology to follow  Valentina Lucks, MSN, NP-C [facility] [phone]  Attending neurologist's note to follow    02/26/2019, 10:16 AM    NEUROHOSPITALIST ADDENDUM Performed a face to face diagnostic evaluation.   I have reviewed the contents of history and physical exam as documented by PA/ARNP/Resident and agree with above documentation.  I have discussed and formulated the above plan as documented. Edits to the note have been made as needed.  Patient with relapsing remitting MS diagnosed in 2017 presents with left-sided paresthesias  and worsening  left-sided weakness.  MRI C-spine shows new lesions, with 2 or 3 enhancing plaques suggestive of active demyelination.  MRI brain does not show interval new lesions consistent with MS.  She is admitted for IV steroids, feels only slightly better.   Unfortunately there is been a major issue going on with insurance and inability of her neurology practice to start disease modifying therapy so as to prevent future relapses.  I have sent a message to Dr. Epimenio Foot of [facility] to see if there is anything they can do to set up an expedited follow-up and start disease modifying therapy.  If this is not possible, I do think it is reasonable to start and work on getting approval for disease modifying agent while in the hospital despite its difficulty to do so.     Georgiana Spinner Aayan Haskew MD [facility] [phone]   If 7pm to 7am, please call on call as listed on AMION.

## 2019-02-26 NOTE — Evaluation (Signed)
Physical Therapy Evaluation Patient Details Name: Kelly Benson MRN: [id number] DOB: 1996-03-31 Today's Date: 02/26/2019   History of Present Illness  Patient is a 23 year old female whith MS who was admitted with numbness and tingling on left side. Left 3 fingers, abdomen, Left foot.  Clinical Impression  Patient received standing at sink brushing teeth. Agreeable to PT eval. Patient demonstrates decreased balance with ambulation using SPC. She requires min guard assist for safety with mobility due to being weak and tremulous. She ambulated 100 feet with RW and demonstrates improved balance and safety as well as increased cadence. Patient will benefit from continued skilled PT while here to ensure safety with mobility.     Follow Up Recommendations No PT follow up    Equipment Recommendations  Rolling walker with 5" wheels    Recommendations for Other Services       Precautions / Restrictions Precautions Precautions: Fall Precaution Comments: low fall, but unsteady with mobility using cane Restrictions Weight Bearing Restrictions: No      Mobility  Bed Mobility               General bed mobility comments: patient standing at sink upon entry to room  Transfers Overall transfer level: Modified independent Equipment used: Straight cane                Ambulation/Gait Ambulation/Gait assistance: Min guard Gait Distance (Feet): 200 Feet Assistive device: Straight cane;Rolling walker (2 wheeled) Gait Pattern/deviations: Step-through pattern;Drifts right/left Gait velocity: decreased   General Gait Details: patient ambulated initially with PSC, unsteady with this, requires min guard for safety, Tremulous. Ambulated 100 feet with RW and much improved steadiness, improved cadence.  Stairs Stairs: Yes Stairs assistance: Min guard Stair Management: Two rails;Alternating pattern Number of Stairs: 3 General stair comments: unsure of self on steps, cues for  sequencing  Wheelchair Mobility    Modified Rankin (Stroke Patients Only)       Balance Overall balance assessment: Needs assistance Sitting-balance support: Feet supported Sitting balance-Leahy Scale: Normal     Standing balance support: Single extremity supported;During functional activity;Bilateral upper extremity supported Standing balance-Leahy Scale: Fair Standing balance comment: improved balance with B UE support.                             Pertinent Vitals/Pain Pain Assessment: No/denies pain    Home Living Family/patient expects to be discharged to:: Private residence Living Arrangements: Spouse/significant other Available Help at Discharge: Family Type of Home: Apartment Home Access: Stairs to enter Entrance Stairs-Rails: Right Entrance Stairs-Number of Steps: 1 flight Home Layout: One level Home Equipment: Cane - single point      Prior Function Level of Independence: Independent with assistive device(s)         Comments: works 2 jobs, Production designer, theatre/television/film at store and owns [employer]   Dominant Hand: Right    Extremity/Trunk Assessment   Upper Extremity Assessment Upper Extremity Assessment: Defer to OT evaluation    Lower Extremity Assessment Lower Extremity Assessment: Generalized weakness;LLE deficits/detail LLE Deficits / Details: numbness on bottom of foot. LLE Sensation: decreased light touch    Cervical / Trunk Assessment Cervical / Trunk Assessment: Normal  Communication   Communication: No difficulties  Cognition Arousal/Alertness: Awake/alert Behavior During Therapy: WFL for tasks assessed/performed Overall Cognitive Status: Within Functional Limits for tasks assessed  General Comments      Exercises     Assessment/Plan    PT Assessment Patient needs continued PT services  PT Problem List Decreased strength;Decreased balance;Decreased  activity tolerance;Impaired sensation;Decreased knowledge of use of DME       PT Treatment Interventions Therapeutic activities;DME instruction;Gait training;Therapeutic exercise;Stair training;Functional mobility training;Balance training;Patient/family education    PT Goals (Current goals can be found in the Care Plan section)  Acute Rehab PT Goals Patient Stated Goal: to improve, PT Goal Formulation: With patient Time For Goal Achievement: 03/05/19 Potential to Achieve Goals: Good    Frequency Min 3X/week   Barriers to discharge        Co-evaluation               AM-PAC PT "6 Clicks" Mobility  Outcome Measure Help needed turning from your back to your side while in a flat bed without using bedrails?: None Help needed moving from lying on your back to sitting on the side of a flat bed without using bedrails?: None Help needed moving to and from a bed to a chair (including a wheelchair)?: None Help needed standing up from a chair using your arms (e.g., wheelchair or bedside chair)?: None Help needed to walk in hospital room?: A Little Help needed climbing 3-5 steps with a railing? : A Little 6 Click Score: 22    End of Session Equipment Utilized During Treatment: Gait belt Activity Tolerance: Patient tolerated treatment well Patient left: in bed;with call bell/phone within reach Nurse Communication: Mobility status PT Visit Diagnosis: Unsteadiness on feet (R26.81);Muscle weakness (generalized) (M62.81);Difficulty in walking, not elsewhere classified (R26.2)    Time: 6546-5035 PT Time Calculation (min) (ACUTE ONLY): 31 min   Charges:   PT Evaluation $PT Eval Low Complexity: 1 Low PT Treatments $Gait Training: 8-22 mins        Khari Mally, PT, GCS 02/26/19,9:54 AM

## 2019-02-27 LAB — BASIC METABOLIC PANEL
Anion gap: 7 (ref 5–15)
BUN: 14 mg/dL (ref 6–20)
CO2: 23 mmol/L (ref 22–32)
Calcium: 9.2 mg/dL (ref 8.9–10.3)
Chloride: 108 mmol/L (ref 98–111)
Creatinine, Ser: 0.69 mg/dL (ref 0.44–1.00)
GFR calc Af Amer: >60 mL/min (ref >60)
GFR calc non Af Amer: >60 mL/min (ref >60)
Glucose, Bld: 164 mg/dL — ABNORMAL HIGH (ref 70–99)
Potassium: 3.6 mmol/L (ref 3.5–5.1)
Sodium: 138 mmol/L (ref 135–145)

## 2019-02-27 LAB — CBC
HCT: 38.3 % (ref 36.0–46.0)
Hemoglobin: 12.7 g/dL (ref 12.0–15.0)
MCH: 27.7 pg (ref 26.0–34.0)
MCHC: 33.2 g/dL (ref 30.0–36.0)
MCV: 83.6 fL (ref 80.0–100.0)
Platelets: 343 10*3/uL (ref 150–400)
RBC: 4.58 MIL/uL (ref 3.87–5.11)
RDW: 13.7 % (ref 11.5–15.5)
WBC: 19 10*3/uL — ABNORMAL HIGH (ref 4.0–10.5)
nRBC: 0 % (ref 0.0–0.2)

## 2019-02-27 NOTE — Progress Notes (Signed)
PROGRESS NOTE    Kelly Benson   YIR:[id number] DOB: 1995-11-02 DOA: 02/24/2019  Admitted from: Home PCP: Patient, No Pcp Per   Hospital Summary  This is a 23 year old female with past medical history of multiple sclerosis not on any medications due to previous lack of insurance who presented with left hand abdomen and leg numbness, weakness, paresthesias.  Symptoms started about 2 weeks ago but have begun worsening.  Her left hand symptoms are new from previous and has not had any signs of infection recently.  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  She has since been evaluated by neurology.  A & P   Active Problems:   Multiple sclerosis exacerbation (HCC)   Multiple sclerosis flare unfortunately she has not been able to receive disease modifying therapy since diagnosed 3 years ago due to lack of insurance.  Likely true MS flare as no recent febrile illness or other signs of infection as well as new findings including left hand paresthesias and abdominal paresthesias.  Patient reported she had an episode of bladder incontinence.  This was discussed with neurology who did not recommend lumbar MRI as it would not change management since this is from her MS. Insurance is to start November 17 and would be ready to proceed with treatment at that time.    Continue Solu-Medrol 1 g daily for 3 to 5 days (today is day 3/5)  Will need outpatient neuro follow-up for disease modifying therapy  OT/PT  Neurology is trying to find out if there is anything they can do to expedite patient receiving disease modifying therapy and if not we will start work on getting approval for these agents while in the  hospital  Steroid-induced hyperglycemia and leukocytosis afebrile without signs of infection  DVT prophylaxis: Lovenox   Code Status: Full Code  Diet: Regular Family Communication: I had a long discussion with the patient and her father at bedside today along with the neurologist, Dr. Lorraine Lax.  We expressed the importance of prophylactic treatment for MS.  I showed patient and her father her MRI images today at the request Disposition Plan: Working with neurology and case management to see if the patient will be able to receive disease modifying therapy during his hospital stay or as soon as possible as she has been without any treatment since diagnosis 3 years ago and her disease is progressing.  Planning for discharge hopefully Monday  Consultants   Neurology  Procedures   MRI       Subjective   Patient seen and examined at bedside lying flat no acute distress and resting comfortably with father in room.  Patient states her symptoms are persisting and not improved from yesterday despite having steroids.  Neurology at bedside expressed that this can be typical of MS flares and she may not reach her previous baseline.  Patient expressed understanding.  Patient denies any other complaints at this time  Objective   Vitals:   02/26/19 2016 02/27/19 0051 02/27/19 0339 02/27/19 0742  BP: 116/68 113/60 116/88 107/64  Pulse: 92 79 84 66  Resp: 16 18 16 18   Temp: 98.1 F (36.7 C)  98 F (36.7 C) 98.9 F (37.2 C)  TempSrc: Oral  Oral Oral  SpO2: 97% 97% 98% 98%   No intake or output data in the 24 hours ending 02/27/19 0833 There were no vitals filed for this visit.  Examination:  Physical Exam Vitals signs and nursing note reviewed.  Constitutional:      Appearance: Normal appearance.  HENT:     Head: Normocephalic and atraumatic.     Nose: Nose normal.     Mouth/Throat:     Mouth: Mucous membranes are moist.  Eyes:     Extraocular Movements: Extraocular movements intact.   Neck:     Musculoskeletal: Normal range of motion. No neck rigidity.  Pulmonary:     Effort: Pulmonary effort is normal.     Breath sounds: Normal breath sounds.  Abdominal:     General: Abdomen is flat.     Palpations: Abdomen is soft.  Musculoskeletal: Normal range of motion.        General: No swelling.  Neurological:     Mental Status: She is alert.     Comments: Sensory changes at left hand, left flank, left leg  No clonus  No pronator drift  Bilateral patellar reflexes intact  Psychiatric:        Mood and Affect: Mood normal.        Behavior: Behavior normal.     Data Reviewed: I have personally reviewed following labs and imaging studies  CBC: Recent Labs  Lab 02/24/19 2251 02/26/19 0413 02/27/19 0305  WBC 8.1 16.0* 19.0*  HGB 12.7 12.6 12.7  HCT 38.6 37.7 38.3  MCV 84.5 82.9 83.6  PLT 343 353 343   Basic Metabolic Panel: Recent Labs  Lab 02/24/19 2251 02/26/19 0413 02/27/19 0305  NA 139 136 138  K 3.4* 3.7 3.6  CL 108 107 108  CO2 22 21* 23  GLUCOSE 107* 154* 164*  BUN 12 14 14   CREATININE 0.62 0.56 0.69  CALCIUM 9.0 9.5 9.2   GFR: CrCl cannot be calculated (Unknown ideal weight.). Liver Function Tests: No results for input(s): AST, ALT, ALKPHOS, BILITOT, PROT, ALBUMIN in the last 168 hours. No results for input(s): LIPASE, AMYLASE in the last 168 hours. No results for input(s): AMMONIA in the last 168 hours. Coagulation Profile: No results for input(s): INR, PROTIME in the last 168 hours. Cardiac Enzymes: No results for input(s): CKTOTAL, CKMB, CKMBINDEX, TROPONINI in the last 168 hours. BNP (last 3 results) No results for input(s): PROBNP in the last 8760 hours. HbA1C: No results for input(s): HGBA1C in the last 72 hours. CBG: Recent Labs  Lab 02/24/19 2249  GLUCAP 108*   Lipid Profile: No results for input(s): CHOL, HDL, LDLCALC, TRIG, CHOLHDL, LDLDIRECT in the last 72 hours. Thyroid Function Tests: No results for input(s):  TSH, T4TOTAL, FREET4, T3FREE, THYROIDAB in the last 72 hours. Anemia Panel: No results for input(s): VITAMINB12, FOLATE, FERRITIN, TIBC, IRON, RETICCTPCT in the last 72 hours. Sepsis Labs: No results for input(s): PROCALCITON, LATICACIDVEN in the last 168 hours.  Recent Results (from the past 240 hour(s))  SARS CORONAVIRUS 2 (TAT 6-24 HRS) Nasopharyngeal Nasopharyngeal Swab     Status: None   Collection Time: 02/25/19  8:35 AM   Specimen: Nasopharyngeal Swab  Result Value Ref Range Status   SARS Coronavirus 2 NEGATIVE NEGATIVE Final    Comment: (NOTE) SARS-CoV-2 target nucleic acids are NOT DETECTED. The SARS-CoV-2 RNA is generally detectable in upper and lower respiratory specimens during the acute phase of infection. Negative results do not preclude SARS-CoV-2 infection, do not rule  out co-infections with other pathogens, and should not be used as the sole basis for treatment or other patient management decisions. Negative results must be combined with clinical observations, patient history, and epidemiological information. The expected result is Negative. Fact Sheet for Patients: HairSlick.no Fact Sheet for Healthcare Providers: quierodirigir.com This test is not yet approved or cleared by the [place] FDA and  has been authorized for detection and/or diagnosis of SARS-CoV-2 by FDA under an Emergency Use Authorization (EUA). This EUA will remain  in effect (meaning this test can be used) for the duration of the COVID-19 declaration under Section 56 4(b)(1) of the Act, 21 U.S.C. section 360bbb-3(b)(1), unless the authorization is terminated or revoked sooner. Performed at [facility], [address], [place], [place] [address]          Radiology Studies: Mr Laqueta Jean And Wo Contrast  Result Date: 02/25/2019 CLINICAL DATA:  Multiple sclerosis with new neurologic event EXAM: MRI HEAD WITHOUT AND WITH  CONTRAST MRI CERVICAL SPINE WITHOUT AND WITH CONTRAST TECHNIQUE: Multiplanar, multiecho pulse sequences of the brain and surrounding structures, and cervical spine, to include the craniocervical junction and cervicothoracic junction, were obtained without and with intravenous contrast. CONTRAST:  72mL GADAVIST GADOBUTROL 1 MMOL/ML IV SOLN COMPARISON:  10/15/2018 FINDINGS: MRI HEAD FINDINGS Brain: Numerous demyelinating plaques in the periventricular and juxta cortical cerebral white matter with unchanged extent and distribution when compared to prior. Mild infratentorial disease with 2 small FLAIR hyperintensities in the ventral right mid brain and pons on sagittal FLAIR imaging. No restricted diffusion when accounting for shine through. No abnormal enhancement. No incidental infarct, hemorrhage, hydrocephalus, or mass. Brain volume remains normal. Vascular: Normal flow voids and vascular enhancement Skull and upper cervical spine: No focal marrow lesion Sinuses/Orbits: Negative MRI CERVICAL SPINE FINDINGS Alignment: Positional reversal of cervical lordosis. Vertebrae: C2-3 incomplete segmentation. No bone lesion or fracture. Cord: Patchy short segment cord signal abnormalities between C2 and C6 on sagittal fluid sensitive imaging that appears stable. There is new left lateral enhancing cord plaque at C2 and right lateral cord plaque at C3-4. On sagittal postcontrast imaging there may be a small dorsal cord enhancing plaque at C5-6. Posterior Fossa, vertebral arteries, paraspinal tissues: Negative Disc levels: No significant degenerative changes and no neural impingement IMPRESSION: Brain MRI: Multiple sclerosis with no evidence of acute or interval demyelination. Cervical MRI: 2 or 3 enhancing plaques in the lateral and dorsal cord that are interval from June 2020 and compatible with active demyelination. 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 Electronically Signed   By:  Marnee Spring M.D.   On: 02/25/2019 11:09   Mr Cervical Spine W Wo Contrast  Result Date: 02/25/2019 CLINICAL DATA:  Multiple sclerosis with new neurologic event EXAM: MRI HEAD WITHOUT AND WITH CONTRAST MRI CERVICAL SPINE WITHOUT AND WITH CONTRAST TECHNIQUE: Multiplanar, multiecho pulse sequences of the brain and surrounding structures, and cervical spine, to include the craniocervical junction and cervicothoracic junction, were obtained without and with intravenous contrast. CONTRAST:  23mL GADAVIST GADOBUTROL 1 MMOL/ML IV SOLN COMPARISON:  10/15/2018 FINDINGS: MRI HEAD FINDINGS Brain: Numerous demyelinating plaques in the periventricular and juxta cortical cerebral white matter with unchanged extent and distribution when compared to prior. Mild infratentorial disease with 2 small FLAIR hyperintensities in the ventral right mid brain and pons on sagittal FLAIR imaging. No restricted diffusion when accounting for shine through. No abnormal enhancement. No incidental infarct, hemorrhage, hydrocephalus, or mass. Brain volume remains normal.  Vascular: Normal flow voids and vascular enhancement Skull and upper cervical spine: No focal marrow lesion Sinuses/Orbits: Negative MRI CERVICAL SPINE FINDINGS Alignment: Positional reversal of cervical lordosis. Vertebrae: C2-3 incomplete segmentation. No bone lesion or fracture. Cord: Patchy short segment cord signal abnormalities between C2 and C6 on sagittal fluid sensitive imaging that appears stable. There is new left lateral enhancing cord plaque at C2 and right lateral cord plaque at C3-4. On sagittal postcontrast imaging there may be a small dorsal cord enhancing plaque at C5-6. Posterior Fossa, vertebral arteries, paraspinal tissues: Negative Disc levels: No significant degenerative changes and no neural impingement IMPRESSION: Brain MRI: Multiple sclerosis with no evidence of acute or interval demyelination. Cervical MRI: 2 or 3 enhancing plaques in the lateral  and dorsal cord that are interval from June 2020 and compatible with active demyelination. 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 Electronically Signed   By: Marnee SpringJonathon  Watts M.D.   On: 02/25/2019 11:09        Scheduled Meds:  B-complex with vitamin C  1 tablet Oral Daily   enoxaparin (LOVENOX) injection  40 mg Subcutaneous Q24H   folic acid  1 mg Oral Daily   pantoprazole  40 mg Oral Daily   Continuous Infusions:  methylPREDNISolone (SOLU-MEDROL) injection Stopped (02/26/19 1234)     LOS: 2 days    Time spent: 35 minutes    Kelly DireJared E Tayte Mcwherter, DO [facility] Pager [phone]  If 7PM-7AM, please contact night-coverage [email or web address] Password [email or web address] 02/27/2019, 8:33 AM

## 2019-02-27 NOTE — Progress Notes (Signed)
Occupational Therapy Treatment Patient Details Name: Kelly Benson MRN: [id number] DOB: Mar 24, 1996 Today's Date: 02/27/2019    History of present illness Patient is a 23 year old female whith MS who was admitted with numbness and tingling on left side. Left 3 fingers, abdomen, Left foot.   OT comments  Pt progressing towards acute OT goals. Focus of session was LUE fine and gross motor coordination exercises and functional mobility. Pt reports, and observed to have, improved stability utilizing rw while OOB this session. D/c plan remains appropriate.    Follow Up Recommendations  Outpatient OT;Supervision - Intermittent    Equipment Recommendations  3 in 1 bedside commode;Tub/shower seat    Recommendations for Other Services      Precautions / Restrictions Precautions Precautions: Fall Restrictions Weight Bearing Restrictions: No       Mobility Bed Mobility Overal bed mobility: Modified Independent             General bed mobility comments: no physical assist needed.  Just extra time.  Transfers Overall transfer level: Modified independent Equipment used: Rolling walker (2 wheeled)             General transfer comment: improved stability with rw (vs cane)    Balance Overall balance assessment: Needs assistance Sitting-balance support: Feet supported Sitting balance-Leahy Scale: Normal     Standing balance support: Bilateral upper extremity supported;During functional activity Standing balance-Leahy Scale: Fair Standing balance comment: improved balance with B UE support.                           ADL either performed or assessed with clinical judgement   ADL                   Upper Body Dressing : Minimal assistance;Sitting Upper Body Dressing Details (indicate cue type and reason): discussed hemi dressing technique                 Functional mobility during ADLs: Supervision/safety;Rolling walker General ADL Comments:  LUE impacting ADLs, unsteadiness noted with ambulation. Pt reports, and observed to have improved stability with rw.      Vision       Perception     Praxis      Cognition Arousal/Alertness: Awake/alert Behavior During Therapy: WFL for tasks assessed/performed Overall Cognitive Status: Within Functional Limits for tasks assessed                                          Exercises Exercises: Other exercises Other Exercises Other Exercises: Pt provided teachback and return demo of [facility] and gross motor coordination exercises for LUE.   Shoulder Instructions       General Comments Pt most limited by poor balance on her feet and LUE weakness during adls. 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  Both jobs are difficult with these deficits.    Pertinent Vitals/ Pain       Pain Assessment: No/denies pain  Home Living                                          Prior Functioning/Environment              Frequency  Min 3X/week  Progress Toward Goals  OT Goals(current goals can now be found in the care plan section)  Progress towards OT goals: Progressing toward goals  Acute Rehab OT Goals Patient Stated Goal: to improve, OT Goal Formulation: With patient Time For Goal Achievement: 03/12/19 Potential to Achieve Goals: Good ADL Goals Pt/caregiver will Perform Home Exercise Program: With theraputty;Left upper extremity;Independently;With written HEP provided Additional ADL Goal #2: Pt will be educated in hemi dressing techniques so she can more easily donn bras/clothes on days the LUE feels heavy and weak and will dress with mod I. Additional ADL Goal #3: Pt wll walk to bathroom and toilet with 3:1 over commode with mod I. Additional ADL Goal #4: Pt will bathe self with mod I sitting in shower on seat with wash mitt if needed.  Plan Discharge plan remains appropriate    Co-evaluation                 AM-PAC OT "6  Clicks" Daily Activity     Outcome Measure   Help from another person eating meals?: None Help from another person taking care of personal grooming?: None Help from another person toileting, which includes using toliet, bedpan, or urinal?: None Help from another person bathing (including washing, rinsing, drying)?: A Little Help from another person to put on and taking off regular upper body clothing?: A Little Help from another person to put on and taking off regular lower body clothing?: A Little 6 Click Score: 21    End of Session Equipment Utilized During Treatment: Rolling walker  OT Visit Diagnosis: Unsteadiness on feet (R26.81);Muscle weakness (generalized) (M62.81);Other symptoms and signs involving the nervous system (R29.898);Hemiplegia and hemiparesis Hemiplegia - Right/Left: Left Hemiplegia - dominant/non-dominant: Non-Dominant   Activity Tolerance Patient tolerated treatment well   Patient Left in bed;with call bell/phone within reach   Nurse Communication          Time: 9476-5465 OT Time Calculation (min): 21 min  Charges: OT General Charges $OT Visit: 1 Visit OT Treatments $Self Care/Home Management : 8-22 mins  Tyrone Schimke, OT Acute Rehabilitation Services Pager: [phone] Office: [phone]    Hortencia Pilar 02/27/2019, 4:12 PM

## 2019-02-28 LAB — GLUCOSE, CAPILLARY: Glucose-Capillary: 129 mg/dL — ABNORMAL HIGH (ref 70–99)

## 2019-02-28 LAB — CBC
HCT: 39 % (ref 36.0–46.0)
Hemoglobin: 12.5 g/dL (ref 12.0–15.0)
MCH: 27.4 pg (ref 26.0–34.0)
MCHC: 32.1 g/dL (ref 30.0–36.0)
MCV: 85.3 fL (ref 80.0–100.0)
Platelets: 291 10*3/uL (ref 150–400)
RBC: 4.57 MIL/uL (ref 3.87–5.11)
RDW: 13.9 % (ref 11.5–15.5)
WBC: 19.4 10*3/uL — ABNORMAL HIGH (ref 4.0–10.5)
nRBC: 0 % (ref 0.0–0.2)

## 2019-02-28 LAB — BASIC METABOLIC PANEL
Anion gap: 13 (ref 5–15)
BUN: 19 mg/dL (ref 6–20)
CO2: 23 mmol/L (ref 22–32)
Calcium: 9.1 mg/dL (ref 8.9–10.3)
Chloride: 104 mmol/L (ref 98–111)
Creatinine, Ser: 0.75 mg/dL (ref 0.44–1.00)
GFR calc Af Amer: >60 mL/min (ref >60)
GFR calc non Af Amer: >60 mL/min (ref >60)
Glucose, Bld: 190 mg/dL — ABNORMAL HIGH (ref 70–99)
Potassium: 3.3 mmol/L — ABNORMAL LOW (ref 3.5–5.1)
Sodium: 140 mmol/L (ref 135–145)

## 2019-02-28 MED ORDER — INSULIN ASPART 100 UNIT/ML ~~LOC~~ SOLN
0.0000 [IU] | Freq: Three times a day (TID) | SUBCUTANEOUS | Status: DC
  Administered 2019-02-28 – 2019-03-01 (×2): 1 [IU] via SUBCUTANEOUS

## 2019-02-28 MED ORDER — POTASSIUM CHLORIDE CRYS ER 20 MEQ PO TBCR
20.0000 meq | EXTENDED_RELEASE_TABLET | Freq: Once | ORAL | Status: AC
  Administered 2019-02-28: 14:00:00 20 meq via ORAL
  Filled 2019-02-28: qty 1

## 2019-02-28 NOTE — Progress Notes (Signed)
Interim note  Updated patient and family with regards to effort to start her on DMD. Reassured patient that her left side numbness may not resolve immediately but will likely improve over time.

## 2019-02-28 NOTE — Progress Notes (Addendum)
PROGRESS NOTE    Kelly Benson   ZOX:[id number] DOB: [birth year] DOA: 02/24/2019  Admitted from: Home PCP: Patient, No Pcp Per   Hospital Summary  This is a 23 year old female with past medical history of multiple sclerosis not on any medications due to previous lack of insurance who presented with left hand abdomen and leg numbness, weakness, paresthesias.  Symptoms started about 2 weeks ago but have begun worsening.  Her left hand symptoms are new from previous and has not had any signs of infection recently.  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  She has since been evaluated by neurology.  A & P   Active Problems:   Multiple sclerosis exacerbation (HCC)   Multiple sclerosis flare unfortunately she has not been able to receive disease modifying therapy since diagnosed 3 years ago due to lack of insurance.  Likely true MS flare as no recent febrile illness or other signs of infection as well as new findings including left hand paresthesias and abdominal paresthesias.  Patient reported she had an episode of bladder incontinence.  This was discussed with neurology who did not recommend lumbar MRI as it would not change management since this is from her MS. Insurance is to start November 17 and would be ready to proceed with treatment at that time.  Patient with minimal improvement on steroids . Continue Solu-Medrol 1 g daily for  (today is day 4/5) rash that . Will need outpatient neuro follow-up for disease modifying therapy . OT/PT: Outpatient OT with rolling walker at discharge . Neurology is trying to find out if there is anything they can do to expedite patient receiving disease modifying therapy  and if not we will start work on getting approval for these agents while in the hospital  Hypokalemia repleted  Steroid-induced hyperglycemia and leukocytosis afebrile without signs of infection  Sensitive sliding scale  DVT prophylaxis: Lovenox   Code Status: Full Code  Diet: Regular Family Communication: No family at bedside Disposition Plan: Plan for discharge tomorrow after steroids pending neurology plan  Consultants  . Neurology  Procedures  . MRI       Subjective   Patient seen and examined at bedside.  She is very frustrated today she has not had too much improvement symptomatically with steroids and wishes to go home.  Explained to her that she should complete her 5 days of steroids, tomorrow is the last day.  She understood and agreed.  Other than paresthesias in the left hand, flank, lower extremity she denies any other complaints and is tolerating her diet.  Objective   Vitals:   02/27/19 1924 02/28/19 0053 02/28/19 0439 02/28/19 0735  BP: 116/72 116/84 125/72 120/70  Pulse: 94 83 69 60  Resp: 16 16 18 18   Temp: 98.4 F (36.9 C) 98.8 F (37.1 C) 98.3 F (36.8 C) 98.4 F (36.9 C)  TempSrc: Oral Oral  Oral  SpO2: 97% 98% 98% 97%    Intake/Output Summary (Last 24 hours) at 02/28/2019 1241 Last data filed at 02/28/2019 0900 Gross per 24 hour  Intake 684 ml  Output -  Net 684 ml   There were no vitals filed for this visit.  Examination:  Physical Exam Vitals  signs and nursing note reviewed.  Constitutional:      Appearance: Normal appearance.  HENT:     Head: Normocephalic and atraumatic.     Nose: Nose normal.     Mouth/Throat:     Mouth: Mucous membranes are moist.  Eyes:     Extraocular Movements: Extraocular movements intact.  Neck:     Musculoskeletal: Normal range of motion. No neck rigidity.  Pulmonary:     Effort: Pulmonary effort is normal.     Breath sounds: Normal breath sounds.  Abdominal:     General: Abdomen is flat.      Palpations: Abdomen is soft.  Musculoskeletal:        General: No swelling.     Comments: Right upper extremity 5/5 muscle strength Left upper extremity 4/5 muscle strength  Neurological:     Mental Status: She is alert.     Comments: Paresthesias in the left hand 3 -5 digits  Paresthesias left flank  Paresthesia on medial left leg  Psychiatric:        Mood and Affect: Mood normal.        Behavior: Behavior normal.     Data Reviewed: I have personally reviewed following labs and imaging studies  CBC: Recent Labs  Lab 02/24/19 2251 02/26/19 0413 02/27/19 0305 02/28/19 0556  WBC 8.1 16.0* 19.0* 19.4*  HGB 12.7 12.6 12.7 12.5  HCT 38.6 37.7 38.3 39.0  MCV 84.5 82.9 83.6 85.3  PLT 343 353 343 291   Basic Metabolic Panel: Recent Labs  Lab 02/24/19 2251 02/26/19 0413 02/27/19 0305 02/28/19 0556  NA 139 136 138 140  K 3.4* 3.7 3.6 3.3*  CL 108 107 108 104  CO2 22 21* 23 23  GLUCOSE 107* 154* 164* 190*  BUN 12 14 14 19   CREATININE 0.62 0.56 0.69 0.75  CALCIUM 9.0 9.5 9.2 9.1   GFR: CrCl cannot be calculated (Unknown ideal weight.). Liver Function Tests: No results for input(s): AST, ALT, ALKPHOS, BILITOT, PROT, ALBUMIN in the last 168 hours. No results for input(s): LIPASE, AMYLASE in the last 168 hours. No results for input(s): AMMONIA in the last 168 hours. Coagulation Profile: No results for input(s): INR, PROTIME in the last 168 hours. Cardiac Enzymes: No results for input(s): CKTOTAL, CKMB, CKMBINDEX, TROPONINI in the last 168 hours. BNP (last 3 results) No results for input(s): PROBNP in the last 8760 hours. HbA1C: No results for input(s): HGBA1C in the last 72 hours. CBG: Recent Labs  Lab 02/24/19 2249  GLUCAP 108*   Lipid Profile: No results for input(s): CHOL, HDL, LDLCALC, TRIG, CHOLHDL, LDLDIRECT in the last 72 hours. Thyroid Function Tests: No results for input(s): TSH, T4TOTAL, FREET4, T3FREE, THYROIDAB in the last 72 hours. Anemia Panel:  No results for input(s): VITAMINB12, FOLATE, FERRITIN, TIBC, IRON, RETICCTPCT in the last 72 hours. Sepsis Labs: No results for input(s): PROCALCITON, LATICACIDVEN in the last 168 hours.  Recent Results (from the past 240 hour(s))  SARS CORONAVIRUS 2 (TAT 6-24 HRS) Nasopharyngeal Nasopharyngeal Swab     Status: None   Collection Time: 02/25/19  8:35 AM   Specimen: Nasopharyngeal Swab  Result Value Ref Range Status   SARS Coronavirus 2 NEGATIVE NEGATIVE Final    Comment: (NOTE) SARS-CoV-2 target nucleic acids are NOT DETECTED. The SARS-CoV-2 RNA is generally detectable in upper and lower respiratory specimens during the acute phase of infection. Negative results do not preclude SARS-CoV-2 infection, do not rule out co-infections with other pathogens, and should not be  used as the sole basis for treatment or other patient management decisions. Negative results must be combined with clinical observations, patient history, and epidemiological information. The expected result is Negative. Fact Sheet for Patients: SugarRoll.be Fact Sheet for Healthcare Providers: https://www.woods-mathews.com/ This test is not yet approved or cleared by the [place] FDA and  has been authorized for detection and/or diagnosis of SARS-CoV-2 by FDA under an Emergency Use Authorization (EUA). This EUA will remain  in effect (meaning this test can be used) for the duration of the COVID-19 declaration under Section 56 4(b)(1) of the Act, 21 U.S.C. section 360bbb-3(b)(1), unless the authorization is terminated or revoked sooner. Performed at [facility], [place] [address], [place], [place] [address]          Radiology Studies: No results found.      Scheduled Meds: . B-complex with vitamin C  1 tablet Oral Daily  . enoxaparin (LOVENOX) injection  40 mg Subcutaneous Q24H  . folic acid  1 mg Oral Daily  . pantoprazole  40 mg Oral Daily    Continuous Infusions: . methylPREDNISolone (SOLU-MEDROL) injection 1,000 mg (02/28/19 1020)     LOS: 3 days    Time spent: 15 minutes    Harold Hedge, DO [facility] Pager [phone]  If 7PM-7AM, please contact night-coverage [email or web address] Password [email or web address] 02/28/2019, 12:41 PM

## 2019-03-01 ENCOUNTER — Telehealth: Payer: Self-pay | Admitting: *Deleted

## 2019-03-01 LAB — HEMOGLOBIN A1C
Hgb A1c MFr Bld: 5.4 % (ref 4.8–5.6)
Mean Plasma Glucose: 108 mg/dL

## 2019-03-01 NOTE — Discharge Summary (Signed)
Physician Discharge Summary  Kelly Benson JZP:[id number] DOB: 10-05-1995 DOA: 02/24/2019  PCP: Patient, No Pcp Per  Admit date: 02/24/2019 Discharge date: 03/01/2019  Admitted From: Home Discharged to: Home Home Health: None Equipment/Devices: 3 in 1 and walker Discharge Condition: Stable  Recommendations for Outpatient Follow-up   1. Follow-up with Dr. Epimenio Foot this Wednesday for MS 2. Please follow up BMP/CBC 3. Needs a PCP.  Patient stated she would range herself 4. Please assess for medical appropriateness to return to work  Medication Adjustments at Discharge  1. None   Hospital Summary  This is a 23 year old female with past medical history of multiple sclerosis not on any medications due to previous lack of insurance who presented with left hand abdomen and leg numbness, weakness, paresthesias.  Symptoms started about 2 weeks ago but have begun worsening.  Her left hand symptoms are new from previous and has not had any signs of infection recently.  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  She has since been evaluated by neurology received 5 days of steroids without much improvement in her sensation of the left upper extremity, left flank and left lower extremity however did have some improvement in her strength.  Patient is to follow-up and establish care with Dr. Epimenio Foot, MS neurology, this Wednesday as an outpatient with plan to hopefully start on disease modifying therapy.  Apparently, her insurance does not start until November 17.  Patient also is to arrange for a PCP appointment on her own.  She was instructed not to return to work until medically cleared.  A & P   Active Problems:  Multiple sclerosis exacerbation (HCC)    Code Status: Full Code   Subjective  Patient seen and examined at bedside no acute distress and resting comfortably.  No events overnight.  Tolerating diet. In good spirits and anticipating discharge.   Denies any chest pain, fever, nausea, vomiting, urinary or bowel complaints.  Admits to persistent paresthesias left upper extremity, left flank and left leg.  Otherwise ROS negative   Objective   Discharge Exam: Vitals:   03/01/19 0800 03/01/19 1200  BP: 132/69 131/65  Pulse: 77 73  Resp: 18 18  Temp: 97.8 F (36.6 C)   SpO2: 100% 100%   Vitals:   03/01/19 0027 03/01/19 0500 03/01/19 0800 03/01/19 1200  BP: 102/82 111/67 132/69 131/65  Pulse: 73 (!) 53 77 73  Resp: 16 18 18 18   Temp: 98.5 F (36.9 C) 98 F (36.7 C) 97.8 F (36.6 C)   TempSrc: Oral Oral Oral   SpO2: 98% 100% 100% 100%    Physical Exam Vitals signs and nursing note reviewed. Exam conducted with a chaperone present.  Constitutional:      Appearance: Normal appearance.  HENT:     Head: Normocephalic and atraumatic.     Nose: Nose normal.     Mouth/Throat:     Mouth: Mucous membranes are moist.  Eyes:     Extraocular Movements: Extraocular movements intact.  Neck:     Musculoskeletal: Normal range of motion. No neck rigidity.  Cardiovascular:     Rate and Rhythm: Normal rate and regular rhythm.  Pulmonary:     Effort: Pulmonary effort is normal.  Breath sounds: Normal breath sounds.  Abdominal:     General: Abdomen is flat.     Palpations: Abdomen is soft.  Musculoskeletal: Normal range of motion.        General: No swelling.  Neurological:     Mental Status: She is alert.     Comments: Improving strength 5/5 bilateral upper and lower extremities.  Persistent sensory deficit pain left hand ulnar distribution, left flank and left calf  Psychiatric:        Mood and Affect: Mood normal.        Behavior: Behavior normal.       The results of  significant diagnostics from this hospitalization (including imaging, microbiology, ancillary and laboratory) are listed below for reference.     Microbiology: Recent Results (from the past 240 hour(s))  SARS CORONAVIRUS 2 (TAT 6-24 HRS) Nasopharyngeal Nasopharyngeal Swab     Status: None   Collection Time: 02/25/19  8:35 AM   Specimen: Nasopharyngeal Swab  Result Value Ref Range Status   SARS Coronavirus 2 NEGATIVE NEGATIVE Final    Comment: (NOTE) SARS-CoV-2 target nucleic acids are NOT DETECTED. The SARS-CoV-2 RNA is generally detectable in upper and lower respiratory specimens during the acute phase of infection. Negative results do not preclude SARS-CoV-2 infection, do not rule out co-infections with other pathogens, and should not be used as the sole basis for treatment or other patient management decisions. Negative results must be combined with clinical observations, patient history, and epidemiological information. The expected result is Negative. Fact Sheet for Patients: HairSlick.nohttps://www.fda.gov/media/138098/download Fact Sheet for Healthcare Providers: quierodirigir.comhttps://www.fda.gov/media/138095/download This test is not yet approved or cleared by the [place] FDA and  has been authorized for detection and/or diagnosis of SARS-CoV-2 by FDA under an Emergency Use Authorization (EUA). This EUA will remain  in effect (meaning this test can be used) for the duration of the COVID-19 declaration under Section 56 4(b)(1) of the Act, 21 U.S.C. section 360bbb-3(b)(1), unless the authorization is terminated or revoked sooner. Performed at [facility], [address] [place], [place] [address]      Labs: BNP (last 3 results) No results for input(s): BNP in the last 8760 hours. Basic Metabolic Panel: Recent Labs  Lab 02/24/19 2251 02/26/19 0413 02/27/19 0305 02/28/19 0556  NA 139 136 138 140  K 3.4* 3.7 3.6 3.3*  CL 108 107 108 104  CO2 22 21* 23 23  GLUCOSE 107* 154*  164* 190*  BUN 12 14 14 19   CREATININE 0.62 0.56 0.69 0.75  CALCIUM 9.0 9.5 9.2 9.1   Liver Function Tests: No results for input(s): AST, ALT, ALKPHOS, BILITOT, PROT, ALBUMIN in the last 168 hours. No results for input(s): LIPASE, AMYLASE in the last 168 hours. No results for input(s): AMMONIA in the last 168 hours. CBC: Recent Labs  Lab 02/24/19 2251 02/26/19 0413 02/27/19 0305 02/28/19 0556  WBC 8.1 16.0* 19.0* 19.4*  HGB 12.7 12.6 12.7 12.5  HCT 38.6 37.7 38.3 39.0  MCV 84.5 82.9 83.6 85.3  PLT 343 353 343 291   Cardiac Enzymes: No results for input(s): CKTOTAL, CKMB, CKMBINDEX, TROPONINI in the last 168 hours. BNP: Invalid input(s): POCBNP CBG: Recent Labs  Lab 02/24/19 2249 02/28/19 1629  GLUCAP 108* 129*   D-Dimer No results for input(s): DDIMER in the last 72 hours. Hgb A1c Recent Labs    02/28/19 0556  HGBA1C 5.4   Lipid Profile No results for input(s): CHOL, HDL, LDLCALC, TRIG, CHOLHDL, LDLDIRECT in the last  72 hours. Thyroid function studies No results for input(s): TSH, T4TOTAL, T3FREE, THYROIDAB in the last 72 hours.  Invalid input(s): FREET3 Anemia work up No results for input(s): VITAMINB12, FOLATE, FERRITIN, TIBC, IRON, RETICCTPCT in the last 72 hours. Urinalysis    Component Value Date/Time   COLORURINE YELLOW 02/25/2019 0006   APPEARANCEUR CLOUDY (A) 02/25/2019 0006   LABSPEC 1.026 02/25/2019 0006   PHURINE 5.0 02/25/2019 0006   GLUCOSEU NEGATIVE 02/25/2019 0006   HGBUR SMALL (A) 02/25/2019 0006   BILIRUBINUR NEGATIVE 02/25/2019 0006   KETONESUR NEGATIVE 02/25/2019 0006   PROTEINUR NEGATIVE 02/25/2019 0006   UROBILINOGEN 1.0 03/11/2017 1450   NITRITE NEGATIVE 02/25/2019 0006   LEUKOCYTESUR NEGATIVE 02/25/2019 0006   Sepsis Labs Invalid input(s): PROCALCITONIN,  WBC,  LACTICIDVEN Microbiology Recent Results (from the past 240 hour(s))  SARS CORONAVIRUS 2 (TAT 6-24 HRS) Nasopharyngeal Nasopharyngeal Swab     Status: None    Collection Time: 02/25/19  8:35 AM   Specimen: Nasopharyngeal Swab  Result Value Ref Range Status   SARS Coronavirus 2 NEGATIVE NEGATIVE Final    Comment: (NOTE) SARS-CoV-2 target nucleic acids are NOT DETECTED. The SARS-CoV-2 RNA is generally detectable in upper and lower respiratory specimens during the acute phase of infection. Negative results do not preclude SARS-CoV-2 infection, do not rule out co-infections with other pathogens, and should not be used as the sole basis for treatment or other patient management decisions. Negative results must be combined with clinical observations, patient history, and epidemiological information. The expected result is Negative. Fact Sheet for Patients: SugarRoll.be Fact Sheet for Healthcare Providers: https://www.woods-mathews.com/ This test is not yet approved or cleared by the [place] FDA and  has been authorized for detection and/or diagnosis of SARS-CoV-2 by FDA under an Emergency Use Authorization (EUA). This EUA will remain  in effect (meaning this test can be used) for the duration of the COVID-19 declaration under Section 56 4(b)(1) of the Act, 21 U.S.C. section 360bbb-3(b)(1), unless the authorization is terminated or revoked sooner. Performed at [facility], [place] [address], [place], [place] [address]     Discharge Instructions     Discharge Instructions    Ambulatory referral to Neurology   Complete by: As directed    I have spoken to Dr.Sater and office will contact patient to obtain office visit this Wednesday   Diet - low sodium heart healthy   Complete by: As directed    Discharge instructions   Complete by: As directed    Seen in the hospital for a flareup of her multiple sclerosis and oliguric.  You were evaluated by hospitalist as well as a neurologist.  You were started on 5 days of steroids and completed your treatment.  You are being referred to the outpatient  neurologist, Dr. Felecia Shelling who is to follow-up with you hopefully sometime this week and get you started on disease modifying therapy ASAP.  If you have any significant change or worsening of your symptoms please not hesitate return to the ED.   Increase activity slowly   Complete by: As directed      Allergies as of 03/01/2019   No Known Allergies     Medication List    TAKE these medications   B-complex with vitamin C tablet Take 1 tablet by mouth daily.   folic acid 1 MG tablet Commonly known as: FOLVITE Take 1 mg by mouth daily.   omega-3 acid ethyl esters 1 g capsule Commonly known as: LOVAZA Take 2 g  by mouth daily.            Durable Medical Equipment  (From admission, onward)         Start     Ordered   03/01/19 1216  DME 3-in-1  Once     03/01/19 1215   03/01/19 1216  DME tub bench  Once     03/01/19 1215   03/01/19 1036  For home use only DME 3 n 1  Once     03/01/19 1035   03/01/19 1036  For home use only DME Walker rolling  Once    Question:  Patient needs a walker to treat with the following condition  Answer:  Multiple sclerosis (HCC)   03/01/19 1035         Follow-up Information    [facility]. Schedule an appointment as soon as possible for a visit in 2 week(s).   Why: You may call for a PCP appointment at this location or:  [facility] patient care center: [phone] (sickle cell center) Contact information: [address] [place] [address] [phone]         No Known Allergies  Time coordinating discharge: Over 30 minutes   SIGNED:   Jae Dire, D.O. [facility] 03/01/2019, 12:42 PM

## 2019-03-01 NOTE — Progress Notes (Signed)
Patient is being discharged home.  Patient to be transported by her husband.  IV removed with the catheter intact.  Discharge instructions given to the patient who verbalized understanding.

## 2019-03-01 NOTE — Progress Notes (Signed)
Physical Therapy Treatment Patient Details Name: Kelly Benson MRN: [id number] DOB: Jul 17, 1995 Today's Date: 03/01/2019    History of Present Illness Patient is a 23 year old female whith MS who was admitted with numbness and tingling on left side. Left 3 fingers, abdomen, Left foot.    PT Comments    Pt very open to education regarding MS and energy conservation. Spoke at length regarding energy conservation techniques and provided hand out. Pt more steady and feels more comfortable with RW than SPC. Pt educated on safe stair negotiation as well. Acute PT to cont to follow.   Follow Up Recommendations  No PT follow up     Equipment Recommendations  Rolling walker with 5" wheels    Recommendations for Other Services       Precautions / Restrictions Precautions Precautions: Fall Precaution Comments: L UE and LE numbness Restrictions Weight Bearing Restrictions: No    Mobility  Bed Mobility Overal bed mobility: Modified Independent             General bed mobility comments: limited use of L UE to aide in transfer  Transfers Overall transfer level: Modified independent Equipment used: None             General transfer comment: pt used cane in standing and then RW, pt reports "I need something to hold on to, i just feel unsteady  Ambulation/Gait Ambulation/Gait assistance: Min guard Gait Distance (Feet): 200 Feet Assistive device: Straight cane;Rolling walker (2 wheeled) Gait Pattern/deviations: Step-through pattern;Drifts right/left Gait velocity: dec   General Gait Details: pt educated on proper sequencing of cane in R hand with L LE, pt reports "I feel better with the RW"", pt more fluid and steady with RW compared to [facility]   Stairs Stairs: Yes Stairs assistance: Min guard Stair Management: One rail Left;Step to pattern;Sideways Number of Stairs: 4 General stair comments: limited by IV pole, pt educated on sideways and forward going down, pts hand  rail is on the L and has difficulty holding onto things with the L   Wheelchair Mobility    Modified Rankin (Stroke Patients Only)       Balance Overall balance assessment: Needs assistance Sitting-balance support: Feet supported Sitting balance-Leahy Scale: Normal     Standing balance support: Bilateral upper extremity supported;During functional activity Standing balance-Leahy Scale: Fair Standing balance comment: improved balance with B UE support.                            Cognition Arousal/Alertness: Awake/alert Behavior During Therapy: WFL for tasks assessed/performed Overall Cognitive Status: Within Functional Limits for tasks assessed                                 General Comments: pt extremely motivated and eager to learn      Exercises      General Comments General comments (skin integrity, edema, etc.): spoke at length regarding energy conservation techniques and provided hand out. discussed about doing grocery shopping on line and other ways to do laundry, cleaning, and cooking      Pertinent Vitals/Pain Pain Assessment: No/denies pain    Home Living                      Prior Function            PT Goals (current goals can now be  found in the care plan section) Acute Rehab PT Goals Patient Stated Goal: improve L sided numbness Progress towards PT goals: Progressing toward goals    Frequency    Min 3X/week      PT Plan Current plan remains appropriate    Co-evaluation              AM-PAC PT "6 Clicks" Mobility   Outcome Measure  Help needed turning from your back to your side while in a flat bed without using bedrails?: None Help needed moving from lying on your back to sitting on the side of a flat bed without using bedrails?: None Help needed moving to and from a bed to a chair (including a wheelchair)?: None Help needed standing up from a chair using your arms (e.g., wheelchair or bedside  chair)?: None Help needed to walk in hospital room?: A Little Help needed climbing 3-5 steps with a railing? : A Little 6 Click Score: 22    End of Session Equipment Utilized During Treatment: Gait belt Activity Tolerance: Patient tolerated treatment well Patient left: in bed;with call bell/phone within reach Nurse Communication: Mobility status PT Visit Diagnosis: Unsteadiness on feet (R26.81);Muscle weakness (generalized) (M62.81);Difficulty in walking, not elsewhere classified (R26.2)     Time: 1610-9604 PT Time Calculation (min) (ACUTE ONLY): 41 min  Charges:  $Gait Training: 23-37 mins $Therapeutic Activity: 8-22 mins                     Kittie Plater, PT, DPT Acute Rehabilitation Services Pager #: [phone] Office #: [phone]    Berline Lopes 03/01/2019, 2:59 PM

## 2019-03-01 NOTE — Telephone Encounter (Signed)
Called and spoke with pt. Schedule new pt visit for 03/03/2019 at 900am, check in 830am. Advised her to wear mask to appt, explained new check in procedure. She has no insurance right now, it will become effective 03/30/19 (she will have [employer]). Spoke with Gaynell and pt agreed to bring 100.00 to appt with her.

## 2019-03-01 NOTE — Plan of Care (Signed)
Plan of care adequate for discharge.

## 2019-03-01 NOTE — TOC Transition Note (Signed)
Transition of Care (TOC) - CM/SW Discharge Note   Patient Details  Name: Kelly Benson MRN: [id number] Date of Birth: 10/16/1995  Transition of Care (TOC) CM/SW Contact:  Kelli F Willard, RN Phone Number: 03/01/2019, 10:40 AM   Clinical Narrative:    Pt discharging home today with self care. CM met with the patient and she will have insurance starting Nov 17th through her spouse. Currently she is uninsured. She was going to [facility] but didn't feel she needed to continue. CM asked about getting her into a PCP. She prefers that information be provided and she will arrange. CM provided her 2 clinics information.  Pt will have walker and 3 in 1 delivered to room. [employer] [facility] to provide under charity care.  Pt has transportation home.    Final next level of care: Home/Self Care Barriers to Discharge: Inadequate or no insurance, Barriers Unresolved (comment)   Patient Goals and CMS Choice        Discharge Placement                       Discharge Plan and Services                DME Arranged: 3-N-1, Walker rolling DME Agency: [facility] Date DME Agency Contacted: 03/01/19   Representative spoke with at DME Agency: Zack            Social Determinants of Health (SDOH) Interventions     Readmission Risk Interventions No flowsheet data found.     

## 2019-03-01 NOTE — Progress Notes (Addendum)
NEUROLOGY PROGRESS NOTE  Subjective: Today will receive her last dose of Solu-Medrol.  She states that the dizziness has improved. However, she still has the tingling on the left side.  Exam: Vitals:   03/01/19 0500 03/01/19 0800  BP: 111/67 132/69  Pulse: (!) 53 77  Resp: 18 18  Temp: 98 F (36.7 C) 97.8 F (36.6 C)  SpO2: 100% 100%    Physical Exam   HEENT-  Normocephalic, no lesions, without obvious abnormality.  Normal external eye and conjunctiva.   Extremities- Warm, dry and intact Musculoskeletal-no joint tenderness, deformity or swelling Skin-warm and dry, no hyperpigmentation, vitiligo, or suspicious lesions  Neuro:  Mental Status: Alert, oriented, thought content appropriate.  Speech fluent without evidence of aphasia.  Able to follow 3 step commands without difficulty. Cranial Nerves: II:  Visual fields grossly normal,  III,IV, VI: ptosis not present, extra-ocular motions intact bilaterally pupils equal, round, reactive to light and accommodation V,VII: smile symmetric, facial light touch sensation normal bilaterally VIII: hearing normal bilaterally IX,X: Palate rises midline XI: bilateral shoulder shrug XII: midline tongue extension Motor: Right : Upper extremity   5/5    Left:     Upper extremity   4/5  Lower extremity   5/5     Lower extremity   4/5 with leg Tone and bulk:normal tone throughout; no atrophy noted Sensory: Pinprick and light touch intact throughout, bilaterally Deep Tendon Reflexes: 2+ and symmetric throughout Plantars: Right: downgoing   Left: downgoing Cerebellar: normal finger-to-nose,      Medications:  Scheduled: . B-complex with vitamin C  1 tablet Oral Daily  . enoxaparin (LOVENOX) injection  40 mg Subcutaneous Q24H  . folic acid  1 mg Oral Daily  . insulin aspart  0-9 Units Subcutaneous TID WC  . pantoprazole  40 mg Oral Daily   Continuous: . methylPREDNISolone (SOLU-MEDROL) injection 1,000 mg (02/28/19 1020)    Etta Quill PA-C [facility] [phone]   Assessment: Patient with relapsing-remitting MS diagnosed in 2017, presenting with left-sided paresthesias and worsening left-sided weakness.   1. MRI of C-spine showed new lesions, with 2 or 3 enhancing plaques suggestive of active demyelination. MRI brain showed chronic demyelinating lesions.  2. Patient has received 4 doses IV Solu-Medrol with last dose today.   3. Dizziness has improved. However, she still has tingling in her left side.  Spoke to Dr. Felecia Shelling of [facility] who is going to try to get her an appointment with his clinic on Wednesday of this week in order to discuss ongoing DMD.  Recommendations: - Complete dose 5/5 of IV methylprednisolone today -- Outpatient follow-up with Dr. Felecia Shelling  Electronically signed: Dr. Kerney Elbe 03/01/2019, 9:21 AM

## 2019-03-01 NOTE — Plan of Care (Signed)
Patient is progressing towards goals.

## 2019-03-01 NOTE — Progress Notes (Signed)
Occupational Therapy Treatment Patient Details Name: BRANTLEIGH MIFFLIN MRN: [id number] DOB: [birth year] Today's Date: 03/01/2019    History of present illness Patient is a 23 year old female whith MS who was admitted with numbness and tingling on left side. Left 3 fingers, abdomen, Left foot.   OT comments  Pt making steady progress towards OT goals this session. Session focus on compensatory strategies for LUE weakness and discussion of ADL routine at home. Education and visual demonstration  on using tub transfer bench to assist with energy conservation during ADL routine. Discussed UB dressing technique to assist with LUE weakness; demo'ed strategy with pt verbalizing understanding. Provided compensatory strategies  to incorporate into ADL/ IADL routine to maximize functional independence. DC plan remains appropriate; will continue to follow acutely for OT needs.    Follow Up Recommendations  Outpatient OT;Supervision - Intermittent    Equipment Recommendations  3 in 1 bedside commode;Tub/shower seat    Recommendations for Other Services      Precautions / Restrictions Precautions Precautions: Fall Precaution Comments: insteady with ambulation Restrictions Weight Bearing Restrictions: No       Mobility Bed Mobility Overal bed mobility: Modified Independent             General bed mobility comments: pt declined OOB transfer  Transfers                 General transfer comment: declined OOB transfer    Balance                                           ADL either performed or assessed with clinical judgement   ADL         Grooming Details (indicate cue type and reason): education on compensatory strategies for LUE weakness           Upper Body Dressing Details (indicate cue type and reason): education on dressing techniques for LUE weakness                   General ADL Comments: session focus on ADL education and  compensatory strategies for LUE weakness; pt receptive to education and verbalizes understanding     Vision Baseline Vision/History: No visual deficits Patient Visual Report: No change from baseline Vision Assessment?: No apparent visual deficits   Perception     Praxis      Cognition Arousal/Alertness: Awake/alert Behavior During Therapy: WFL for tasks assessed/performed Overall Cognitive Status: Within Functional Limits for tasks assessed                                 General Comments: pt motivated and receptive to education        Exercises     Shoulder Instructions       General Comments pts husband entered during session; supportive and encouraging    Pertinent Vitals/ Pain       Pain Assessment: No/denies pain  Home Living                                          Prior Functioning/Environment              Frequency  Min 3X/week  Progress Toward Goals  OT Goals(current goals can now be found in the care plan section)  Progress towards OT goals: Progressing toward goals  Acute Rehab OT Goals Patient Stated Goal: to improve, OT Goal Formulation: With patient Time For Goal Achievement: 03/12/19 Potential to Achieve Goals: Good  Plan Discharge plan remains appropriate    Co-evaluation                 AM-PAC OT "6 Clicks" Daily Activity     Outcome Measure   Help from another person eating meals?: None Help from another person taking care of personal grooming?: None Help from another person toileting, which includes using toliet, bedpan, or urinal?: None Help from another person bathing (including washing, rinsing, drying)?: A Little Help from another person to put on and taking off regular upper body clothing?: A Little Help from another person to put on and taking off regular lower body clothing?: A Little 6 Click Score: 21    End of Session    OT Visit Diagnosis: Unsteadiness on feet  (R26.81);Muscle weakness (generalized) (M62.81);Other symptoms and signs involving the nervous system (R29.898);Hemiplegia and hemiparesis Hemiplegia - Right/Left: Left Hemiplegia - dominant/non-dominant: Non-Dominant   Activity Tolerance Patient tolerated treatment well   Patient Left in bed;with call bell/phone within reach;with family/visitor present   Nurse Communication Mobility status        Time: 5885-0277 OT Time Calculation (min): 23 min  Charges: OT General Charges $OT Visit: 1 Visit OT Treatments $Self Care/Home Management : 23-37 mins  [place], [place] [phone] [place] 03/01/2019, 10:47 AM

## 2019-03-03 ENCOUNTER — Ambulatory Visit: Payer: Self-pay | Admitting: Neurology

## 2019-03-03 ENCOUNTER — Telehealth: Payer: Self-pay | Admitting: *Deleted

## 2019-03-03 ENCOUNTER — Other Ambulatory Visit: Payer: Self-pay

## 2019-03-03 ENCOUNTER — Encounter: Payer: Self-pay | Admitting: Neurology

## 2019-03-03 VITALS — BP 110/60 | HR 73 | Temp 97.7°F | Ht 64.0 in | Wt 196.0 lb

## 2019-03-03 DIAGNOSIS — Z5989 Other problems related to housing and economic circumstances: Secondary | ICD-10-CM

## 2019-03-03 DIAGNOSIS — G35 Multiple sclerosis: Secondary | ICD-10-CM

## 2019-03-03 DIAGNOSIS — R269 Unspecified abnormalities of gait and mobility: Secondary | ICD-10-CM

## 2019-03-03 DIAGNOSIS — Z598 Other problems related to housing and economic circumstances: Secondary | ICD-10-CM

## 2019-03-03 DIAGNOSIS — R42 Dizziness and giddiness: Secondary | ICD-10-CM

## 2019-03-03 DIAGNOSIS — R200 Anesthesia of skin: Secondary | ICD-10-CM | POA: Insufficient documentation

## 2019-03-03 NOTE — Progress Notes (Signed)
[facility]  PATIENT: Kelly Benson DOB: 15-Aug-1995  REFERRING DOCTOR OR PCP: None SOURCE: Patient, emergency room and hospital admission and discharge notes, imaging and laboratory reports, MRI images personally reviewed.  _________________________________   HISTORICAL  CHIEF COMPLAINT:  Chief Complaint  Patient presents with  . New Patient (Initial Visit)    RM 79 with husband (temp: 98.0). Hospital f/u for MS. Was d/c'd from [facility] on 03/01/2019. She received IV steroids x5 days. Still having balance issues, shakiness in legs, dizziness, numbness/tingling on left side in fingers, arm, calf/toes, stomach. Has intermittent spasms in chest/back.   . Gait Problem    Ambulates with cane or rolling walker, has unstable gait. No falls recently per pt.   . Eye Problem    Denies any vision issues.   . Multiple Sclerosis    Has never been on DMT. No family hx of MS.     HISTORY OF PRESENT ILLNESS:  I had the pleasure of seeing your patient, Kelly Benson, at the [facility] for neurologic consultation regarding her recent diagnosis of MS.  She is a 23 year old woman who was diagnosed with MS in April 2017.   In retrospect in 2015 or 2016 she had the onset of slurred speech with a lisp that mostly persisted.    In 2017, the speech was more slurred and she felt off balanced.  She went to the ED and MRI was consistent with MS.   She did not follow up with a neurologist.   She did well until 2 weeks ago.    She had a numbing sensation in her left middle finger that then increased to the 4th and 5th finger.  Over the next 2 days, numbness developed in the left body from the hand on down.   Looking down, she has a Lhermitte sign.   She went to the ED on 02/24/19, a few days after symptoms appeared.    She received 5 days of IV Solumedrol and she did PT in the hospital and occupational therapy.   She notes left sided weakness and clumsiness.     She has severe numbness in the 3rd-5th fingers on the left and moderate numbness in the left  flank and perineum.   Bladder function is worse with urge incontinence at times.    She notes that her vision is worse OD.      I personally reviewed the MRIs of the brain from 02/25/2019 and compared them to the MRI from 10/14/2018.  MRI of the brain shows multiple T2/flair hyperintense foci in the hemispheres and also a focus in the right cerebral peduncle and 1 in the left thalamus.  There are several T2 hyperintense foci in the spinal cord.  There is an enhancing lesion to the left adjacent to C2 and another lesion posteriorly to the right at that level.  There is a posterior lesion, a little more to the left, a little more to the left at C4-C5 with subtle enhancement.  There is another focus centrally and anteriorly adjacent to C6-C7.  The focus to the left at C2 was not present on the previous MRI from June 2020.  I also reviewed her first MRI from 09/02/2015.  There are fewer foci on that MRI compared to the 2020 studies but some of the foci were enhancing at that time.Marland Kitchen  REVIEW OF SYSTEMS: Constitutional: No fevers, chills, sweats, or change in appetite Eyes: No visual changes, double vision,  eye pain Ear, nose and throat: No hearing loss, ear pain, nasal congestion, sore throat Cardiovascular: No chest pain, palpitations Respiratory: No shortness of breath at rest or with exertion.   No wheezes GastrointestinaI: No nausea, vomiting, diarrhea, abdominal pain, fecal incontinence Genitourinary: No dysuria, urinary retention or frequency.  No nocturia. Musculoskeletal: No neck pain, back pain Integumentary: No rash, pruritus, skin lesions Neurological: as above Psychiatric: No depression at this time.  No anxiety Endocrine: No palpitations, diaphoresis, change in appetite, change in weigh or increased thirst Hematologic/Lymphatic: No anemia, purpura, petechiae. Allergic/Immunologic: No itchy/runny  eyes, nasal congestion, recent allergic reactions, rashes  ALLERGIES: No Known Allergies  HOME MEDICATIONS:  Current Outpatient Medications:  .  B Complex-C (B-COMPLEX WITH VITAMIN C) tablet, Take 1 tablet by mouth daily., Disp: , Rfl:  .  folic acid (FOLVITE) 1 MG tablet, Take 1 mg by mouth daily., Disp: , Rfl:  .  omega-3 acid ethyl esters (LOVAZA) 1 g capsule, Take 2 g by mouth daily., Disp: , Rfl:   PAST MEDICAL HISTORY: Past Medical History:  Diagnosis Date  . MS (multiple sclerosis) (HCC)   . Scoliosis     PAST SURGICAL HISTORY: Past Surgical History:  Procedure Laterality Date  . NO PAST SURGERIES      FAMILY HISTORY: Family History  Problem Relation Age of Onset  . Hypertension Father   . Multiple sclerosis Neg Hx     SOCIAL HISTORY:  Social History   Socioeconomic History  . Marital status: Married    Spouse name: Not on file  . Number of children: 0  . Years of education: College 15 years  . Highest education level: Not on file  Occupational History  . Occupation: [employer]  Social Needs  . Financial resource strain: Not on file  . Food insecurity    Worry: Not on file    Inability: Not on file  . Transportation needs    Medical: Not on file    Non-medical: Not on file  Tobacco Use  . Smoking status: Never Smoker  . Smokeless tobacco: Never Used  Substance and Sexual Activity  . Alcohol use: Yes    Comment: rare  . Drug use: No  . Sexual activity: Yes    Birth control/protection: None  Lifestyle  . Physical activity    Days per week: Not on file    Minutes per session: Not on file  . Stress: Not on file  Relationships  . Social Musicianconnections    Talks on phone: Not on file    Gets together: Not on file    Attends religious service: Not on file    Active member of club or organization: Not on file    Attends meetings of clubs or organizations: Not on file    Relationship status: Not on file  . Intimate partner violence    Fear of  current or ex partner: Not on file    Emotionally abused: Not on file    Physically abused: Not on file    Forced sexual activity: Not on file  Other Topics Concern  . Not on file  Social History Narrative    Right handed    Tea/soda daily   Lives with husband     PHYSICAL EXAM  Vitals:   03/03/19 0913  BP: 110/60  Pulse: 73  Temp: 97.7 F (36.5 C)  SpO2: 98%  Weight: 196 lb (88.9 kg)  Height: 5\' 4"  (1.626 m)    Body mass index  is 33.64 kg/m.   General: The patient is well-developed and well-nourished and in no acute distress  HEENT:  Head is [place]/AT.  Sclera are anicteric.  Funduscopic exam shows normal optic discs and retinal vessels.  Neck: No carotid bruits are noted.  The neck is nontender.  Cardiovascular: The heart has a regular rate and rhythm with a normal S1 and S2. There were no murmurs, gallops or rubs.    Skin: Extremities are without rash or  edema.  Musculoskeletal:  Back is nontender  Neurologic Exam  Mental status: The patient is alert and oriented x 3 at the time of the examination. The patient has apparent normal recent and remote memory, with an apparently normal attention span and concentration ability.   Speech is normal.  Cranial nerves: Extraocular movements are full.  She has a mild right APD.  There is reduced color saturation OD.  Facial symmetry is present. There is good facial sensation to soft touch bilaterally.Facial strength is normal.  Trapezius and sternocleidomastoid strength is normal. No dysarthria is noted.  The tongue is midline, and the patient has symmetric elevation of the soft palate. No obvious hearing deficits are noted.  Motor:  Muscle bulk is normal.   Tone is normal. Strength is  4- / 5 in left grip, triceps 4/5 biceps, 4/5 left leg.   Sensory: Sensory testing is slightly reduced to vibration in the left 5th finger.   Touch/.temp very reduced on the left 3-5th fingers and chest but abdomen and legs are more symmetric.   Coordination: Cerebellar testing reveals fairly normal finger-nose-finger and heel-to-shin (for reduced strength).  Gait and station: Station is normal.   Gait is ataxic and she can walk in the room without support but needs a cane for longer distance.. Romberg is positive.   Reflexes: Deep tendon reflexes are symmetric and normal bilaterally.   Plantar responses are flexor.    DIAGNOSTIC DATA (LABS, IMAGING, TESTING) - I reviewed patient records, labs, notes, testing and imaging myself where available.  Lab Results  Component Value Date   WBC 19.4 (H) 02/28/2019   HGB 12.5 02/28/2019   HCT 39.0 02/28/2019   MCV 85.3 02/28/2019   PLT 291 02/28/2019       ASSESSMENT AND PLAN  MS (multiple sclerosis) (HCC)  Dizziness  Does not have health insurance  Gait disturbance  Numbness   In summary, Ms. Sellick is a 23 year old woman who was diagnosed with MS several years ago who had a recent exacerbation with symptoms referable to her spinal cord plaques.  Specifically, she has weakness and clumsiness on the left.  We discussed the need to get on a disease modifying therapy to prevent additional relapses.  She does not have insurance at this time.  I can get her started on Vumerity quickly.  We do have an open label study with Ocrevus but her EDSS score would currently be 6.0 so she would not qualify.  We did discuss that if she does have breakthrough activity we will try to get her on either Tysabri or Ocrevus.  If sensory symptoms worsen consider adding gabapentin or another agent.  She will return to see me in a couple months or sooner if there are new or worsening neurologic symptoms.  I also asked her to call our office if she does not hear back from the drug company within 2 weeks.  Thank you for asking me to see Ms. Greenhouse.  Please let me know if I can be of  further assistance with her or other patients in the future.    A. Epimenio Foot, MD, [facility] 03/03/2019, 9:25 AM  Certified in Neurology, Clinical Neurophysiology, Sleep Medicine and Neuroimaging  [facility] [address], [address] [place], [place] [address] [phone]

## 2019-03-03 NOTE — Telephone Encounter (Signed)
Faxed completed/signed Vumerity start form to [facility] at [phone]. Received fax confirmation. Requested pt be enrolled in free drug program d/t having no active insurance coverage. She should be getting [employer] in about a month.

## 2019-03-04 NOTE — Telephone Encounter (Signed)
Received fax from [facility] that they received start form for Vumerity and processing. Questions? [phone].

## 2019-03-08 NOTE — Telephone Encounter (Signed)
Received fax notification from [facility] that free drug program confirmed that Vumerity rx has been shipped.

## 2019-03-17 ENCOUNTER — Telehealth: Payer: Self-pay | Admitting: *Deleted

## 2019-03-17 NOTE — Telephone Encounter (Signed)
Tried mobile [phone]. VM full, cannot LVM. Tried home number at [phone]. Phone continued to ring. Could not leave message. I will also sent mychart message. I was checking in with pt to makes sure she got started on Vumerity okay. If she calls, please ask.

## 2019-03-25 NOTE — Telephone Encounter (Addendum)
Called pt at [phone]. VM full, unable to LVM. Tried [phone]. Phone continued to ring again, could not LVM.  I sent another mychart message for pt. She read previous one sent on 03/17/2019.

## 2019-04-13 ENCOUNTER — Telehealth: Payer: Self-pay | Admitting: *Deleted

## 2019-04-13 ENCOUNTER — Ambulatory Visit: Payer: Self-pay | Admitting: Neurology

## 2019-04-13 NOTE — Telephone Encounter (Signed)
Tried calling pt at [phone]. Phone continued to ring. Called husband and advised appt at 11am today will need to be cx d/t provider not feeling well and having to be out for covid protocol. He will have pt call back to r/s appt.

## 2019-04-27 NOTE — Telephone Encounter (Signed)
Called and spoke with pt because we never received a call back for her to r/s appt we cx on 04/13/19 d/t provider being out. Scheduled appt with her for 05/12/19 at 9am with Dr. Felecia Shelling.  Also advised we received fax from [facility] that she he enrolled in free drug program. She now has [employer] and will upload copy of insurance cards to [facility] account so we can complete PA for Vumerity.

## 2019-04-28 ENCOUNTER — Telehealth: Payer: Self-pay | Admitting: Neurology

## 2019-04-28 NOTE — Telephone Encounter (Signed)
Submitted PA Vumerity. Received instant approval. [id number];TRRNHA:[id number];Review Type:Prior Auth;Coverage Start Date:04/28/2019;Coverage End Date:04/27/2020;

## 2019-04-28 NOTE — Telephone Encounter (Signed)
Dr. Sater- what would you recommend? 

## 2019-04-28 NOTE — Telephone Encounter (Signed)
Initiated PA Vumerity on CMM. Key:[id number]. In process of completing

## 2019-04-28 NOTE — Telephone Encounter (Signed)
Since was present before starting, this should be ok

## 2019-04-28 NOTE — Telephone Encounter (Signed)
Kelly Benson called to report that patient has self-reported shortness of breath and states that she was having the issues prior to starting the vumerity and that although she started the medication it has started back up. Please follow up

## 2019-04-29 NOTE — Telephone Encounter (Signed)
I called Santiago Glad back with [employer]. Relayed Dr. Garth Bigness message. She verbalized understanding and will place note in pt chart and will let pt know if she follows up with them.

## 2019-04-29 NOTE — Telephone Encounter (Signed)
Faxed update to [employer] that pt now has [employer]. Included PA approval info and new insurance cards. Pt enrolled in free drug right now d/t previously not having insurance. Fax: [phone]. Received fax confirmation.

## 2019-05-12 ENCOUNTER — Encounter: Payer: Self-pay | Admitting: Neurology

## 2019-05-12 ENCOUNTER — Ambulatory Visit (INDEPENDENT_AMBULATORY_CARE_PROVIDER_SITE_OTHER): Payer: Managed Care, Other (non HMO) | Admitting: Neurology

## 2019-05-12 ENCOUNTER — Other Ambulatory Visit: Payer: Self-pay

## 2019-05-12 VITALS — BP 113/72 | HR 80 | Temp 97.2°F | Wt 191.0 lb

## 2019-05-12 DIAGNOSIS — R200 Anesthesia of skin: Secondary | ICD-10-CM | POA: Diagnosis not present

## 2019-05-12 DIAGNOSIS — G35 Multiple sclerosis: Secondary | ICD-10-CM

## 2019-05-12 DIAGNOSIS — R269 Unspecified abnormalities of gait and mobility: Secondary | ICD-10-CM | POA: Diagnosis not present

## 2019-05-12 NOTE — Progress Notes (Signed)
[facility]  PATIENT: Kelly Benson DOB: [birth year]  REFERRING DOCTOR OR PCP: None SOURCE: Patient, emergency room and hospital admission and discharge notes, imaging and laboratory reports, MRI images personally reviewed.  _________________________________   HISTORICAL  CHIEF COMPLAINT:  Chief Complaint  Patient presents with  . Follow-up    MS follow up room 12 pt alone pt was in hospital in OCt 2020 for MS flare up     HISTORY OF PRESENT ILLNESS:  Kelly Benson is a 23 y.o. 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   Update 05/12/19: She feels she is doing much better and she is now walking without a cane.   She has been on Vumerity since March 14, 2019.   She had a little flushing and GI upset the second and third week but has no side effects now.     Currently, her gait is more stable and she feels balance is mildly reduced.   She climbs stairs without the bannister but uses it to go downstairs.     Strength is good in her legs.   Her Lhermitte sign resolved and she has no numbness in her hands now.   The thoracic tightness is better She has no difficulty with her bladder.    Vision is fine and she denies color desaturation.     Mood is doing ok though she feels it swings some - she feels not bad enough to start a med.    Cognition is baseline.    She notes fatigue but not sleepiness.    She sleeps well most nights.    She was asking about medical marijuana for some of her symptoms (MS Hug, mood)  From initial consult 03/03/2019: She is a 23 year old woman who was diagnosed with MS in April 2017.   In retrospect in 2015 or 2016 she had the onset of slurred speech with a lisp that mostly persisted.    In 2017, the speech was more slurred and she felt off balanced.  She went to the ED and MRI was consistent with MS.   She did not follow up with a neurologist.   She did well until 2 weeks ago.    She had a numbing sensation in  her left middle finger that then increased to the 4th and 5th finger.  Over the next 2 days, numbness developed in the left body from the hand on down.   Looking down, she has a Lhermitte sign.   She went to the ED on 02/24/19, a few days after symptoms appeared.    She received 5 days of IV Solumedrol and she did PT in the hospital and occupational therapy.   She notes left sided weakness and clumsiness.    She has severe numbness in the 3rd-5th fingers on the left and moderate numbness in the left  flank and perineum.   Bladder function is worse with urge incontinence at times.    She notes that her vision is worse OD.      I personally reviewed the MRIs of the brain from 02/25/2019 and compared them to the MRI from 10/14/2018.  MRI of the brain shows multiple T2/flair hyperintense foci in the hemispheres and also a focus in the right cerebral peduncle and 1 in the left thalamus.  There are several T2 hyperintense foci in the spinal cord.  There is an enhancing lesion to the left adjacent to C2 and another lesion posteriorly  to the right at that level.  There is a posterior lesion, a little more to the left, a little more to the left at C4-C5 with subtle enhancement.  There is another focus centrally and anteriorly adjacent to C6-C7.  The focus to the left at C2 was not present on the previous MRI from June 2020.  I also reviewed her first MRI from 09/02/2015.  There are fewer foci on that MRI compared to the 2020 studies but some of the foci were enhancing at that time.Marland Kitchen  REVIEW OF SYSTEMS: Constitutional: No fevers, chills, sweats, or change in appetite Eyes: No visual changes, double vision, eye pain Ear, nose and throat: No hearing loss, ear pain, nasal congestion, sore throat Cardiovascular: No chest pain, palpitations Respiratory: No shortness of breath at rest or with exertion.   No wheezes GastrointestinaI: No nausea, vomiting, diarrhea, abdominal pain, fecal incontinence Genitourinary: No  dysuria, urinary retention or frequency.  No nocturia. Musculoskeletal: No neck pain, back pain Integumentary: No rash, pruritus, skin lesions Neurological: as above Psychiatric: No depression at this time.  No anxiety Endocrine: No palpitations, diaphoresis, change in appetite, change in weigh or increased thirst Hematologic/Lymphatic: No anemia, purpura, petechiae. Allergic/Immunologic: No itchy/runny eyes, nasal congestion, recent allergic reactions, rashes  ALLERGIES: No Known Allergies  HOME MEDICATIONS:  Current Outpatient Medications:  .  Ascorbic Acid (VITAMIN C PO), Take by mouth., Disp: , Rfl:  .  Cholecalciferol (VITAMIN D-3 PO), Take by mouth. Take one daily, Disp: , Rfl:  .  Cyanocobalamin (B-12 PO), Take by mouth. Take daily, Disp: , Rfl:  .  Diroximel Fumarate (VUMERITY) 231 MG CPDR, Take by mouth., Disp: , Rfl:  .  omega-3 acid ethyl esters (LOVAZA) 1 g capsule, Take 2 g by mouth daily., Disp: , Rfl:   PAST MEDICAL HISTORY: Past Medical History:  Diagnosis Date  . MS (multiple sclerosis) (HCC)   . Scoliosis     PAST SURGICAL HISTORY: Past Surgical History:  Procedure Laterality Date  . NO PAST SURGERIES      FAMILY HISTORY: Family History  Problem Relation Age of Onset  . Hypertension Father   . Multiple sclerosis Neg Hx     SOCIAL HISTORY:  Social History   Socioeconomic History  . Marital status: Married    Spouse name: Not on file  . Number of children: 0  . Years of education: College 15 years  . Highest education level: Not on file  Occupational History  . Occupation: [employer]  Tobacco Use  . Smoking status: Never Smoker  . Smokeless tobacco: Never Used  Substance and Sexual Activity  . Alcohol use: Yes    Comment: rare  . Drug use: No  . Sexual activity: Yes    Birth control/protection: None  Other Topics Concern  . Not on file  Social History Narrative    Right handed    Tea/soda daily   Lives with husband   Social  Determinants of Health   Financial Resource Strain:   . Difficulty of Paying Living Expenses: Not on file  Food Insecurity:   . Worried About Programme researcher, broadcasting/film/video in the Last Year: Not on file  . Ran Out of Food in the Last Year: Not on file  Transportation Needs:   . Lack of Transportation (Medical): Not on file  . Lack of Transportation (Non-Medical): Not on file  Physical Activity:   . Days of Exercise per Week: Not on file  . Minutes of Exercise per Session:  Not on file  Stress:   . Feeling of Stress : Not on file  Social Connections:   . Frequency of Communication with Friends and Family: Not on file  . Frequency of Social Gatherings with Friends and Family: Not on file  . Attends Religious Services: Not on file  . Active Member of Clubs or Organizations: Not on file  . Attends Banker Meetings: Not on file  . Marital Status: Not on file  Intimate Partner Violence:   . Fear of Current or Ex-Partner: Not on file  . Emotionally Abused: Not on file  . Physically Abused: Not on file  . Sexually Abused: Not on file     PHYSICAL EXAM  Vitals:   05/12/19 0901  BP: 113/72  Pulse: 80  Temp: (!) 97.2 F (36.2 C)  Weight: 191 lb (86.6 kg)    Body mass index is 32.79 kg/m.   General: The patient is well-developed and well-nourished and in no acute distress  Skin: Extremities are without rash or  edema.  Neurologic Exam  Mental status: The patient is alert and oriented x 3 at the time of the examination. The patient has apparent normal recent and remote memory, with an apparently normal attention span and concentration ability.   Speech is normal.  Cranial nerves: Extraocular movements are full.  She has a mild right APD.  There is reduced color saturation OD.  Facial symmetry is present. There is good facial sensation to soft touch bilaterally.Facial strength is normal.  Trapezius and sternocleidomastoid strength is normal. No dysarthria is noted.  Hearing is  normal and symmetric.  Motor:  Muscle bulk is normal.   Tone is normal. Strength is now 5/5.  Sensory: She has slight reduced sensation in the left fourth and fifth fingers  Coordination: Cerebellar testing reveals normal finger-nose-finger and heel-to-shin.  Gait and station: Station is normal.  Gait is much better and practically back to normal with just a minimally wide stance.  Tandem gait is wide. Romberg is positive.   Reflexes: Deep tendon reflexes are symmetric and normal bilaterally.     DIAGNOSTIC DATA (LABS, IMAGING, TESTING) - I reviewed patient records, labs, notes, testing and imaging myself where available.  Lab Results  Component Value Date   WBC 19.4 (H) 02/28/2019   HGB 12.5 02/28/2019   HCT 39.0 02/28/2019   MCV 85.3 02/28/2019   PLT 291 02/28/2019       ASSESSMENT AND PLAN  Multiple sclerosis exacerbation (HCC)  Gait disturbance  Numbness   1.   Continue Vumerity.   At next visit will check labs and schedule f/u MRI brain to determine if there is any breakthrough activity.  If she has a breakthrough activity we will consider a different disease modifying therapy. 2.  Stay active and exercise as tolerated. 3,   return in 4 months or sooner for new or worsening neurologic symptoms. Deonne Rooks A. Epimenio Foot, MD, [facility] 05/12/2019, 5:20 PM Certified in Neurology, Clinical Neurophysiology, Sleep Medicine and Neuroimaging  [facility] [address] [place], [place] [address] [phone]

## 2019-07-19 ENCOUNTER — Other Ambulatory Visit: Payer: Self-pay | Admitting: *Deleted

## 2019-07-19 DIAGNOSIS — G35 Multiple sclerosis: Secondary | ICD-10-CM

## 2019-07-19 MED ORDER — VUMERITY 231 MG PO CPDR: 462.0000 mg | DELAYED_RELEASE_CAPSULE | Freq: Two times a day (BID) | ORAL | 3 refills | Status: DC

## 2019-07-22 ENCOUNTER — Encounter: Payer: Self-pay | Admitting: *Deleted

## 2019-07-27 ENCOUNTER — Telehealth: Payer: Self-pay | Admitting: *Deleted

## 2019-07-27 NOTE — Telephone Encounter (Signed)
Submitted PA Vumerity on CMM. Key:[id number]. Waiting on [facility].

## 2019-07-28 NOTE — Telephone Encounter (Signed)
CaseId:[id number];Status:Approved;Review Type:Prior Auth;Coverage Start Date:06/27/2019;Coverage End Date:07/26/2020;

## 2019-08-16 ENCOUNTER — Telehealth: Payer: Self-pay | Admitting: *Deleted

## 2019-08-16 DIAGNOSIS — G35 Multiple sclerosis: Secondary | ICD-10-CM

## 2019-08-16 MED ORDER — VUMERITY 231 MG PO CPDR: 462.0000 mg | DELAYED_RELEASE_CAPSULE | Freq: Two times a day (BID) | ORAL | 3 refills | Status: DC

## 2019-08-16 NOTE — Telephone Encounter (Signed)
Called patient because we received fax from [facility] that they are out of network for her insurance.  She is receiving Vumerity from [employer] with copay assistance program. She will call me back if she has any future issues in getting Vumerity refilled.   I called [employer] and spoke with Allie. She states [facility] is her preferred specialty pharmacy. I e-scribed rx to [facility].

## 2019-09-09 ENCOUNTER — Telehealth: Payer: Self-pay | Admitting: Neurology

## 2019-09-09 NOTE — Telephone Encounter (Signed)
I called patient back. She was under the impression she was going to have an MRI on 09/13/19. I relayed that per Dr. Bonnita Hollow last note he wanted her to f/u and then he would do labs/order repeat MRI after being seen. She was agreeable to this plan. She states she will pay what she owes when she comes for visit and pay towards visit for 09/13/19. She will also speak with billing that day about setting up a payment plan. I offered to transfer her to billing today but she declined. Nothing further needed.

## 2019-09-09 NOTE — Telephone Encounter (Signed)
I called patient to confirm 5/3 appointment. I believe patient has some concerns about the appointment and is requesting a call back from RN today.

## 2019-09-13 ENCOUNTER — Other Ambulatory Visit: Payer: Self-pay

## 2019-09-13 ENCOUNTER — Encounter: Payer: Self-pay | Admitting: Neurology

## 2019-09-13 ENCOUNTER — Ambulatory Visit (INDEPENDENT_AMBULATORY_CARE_PROVIDER_SITE_OTHER): Payer: Managed Care, Other (non HMO) | Admitting: Neurology

## 2019-09-13 ENCOUNTER — Telehealth: Payer: Self-pay | Admitting: Neurology

## 2019-09-13 VITALS — BP 124/76 | HR 76 | Temp 97.1°F | Ht 64.0 in | Wt 195.5 lb

## 2019-09-13 DIAGNOSIS — R269 Unspecified abnormalities of gait and mobility: Secondary | ICD-10-CM

## 2019-09-13 DIAGNOSIS — Z79899 Other long term (current) drug therapy: Secondary | ICD-10-CM | POA: Diagnosis not present

## 2019-09-13 DIAGNOSIS — R200 Anesthesia of skin: Secondary | ICD-10-CM | POA: Diagnosis not present

## 2019-09-13 DIAGNOSIS — E559 Vitamin D deficiency, unspecified: Secondary | ICD-10-CM

## 2019-09-13 DIAGNOSIS — G35 Multiple sclerosis: Secondary | ICD-10-CM

## 2019-09-13 MED ORDER — TOLTERODINE TARTRATE ER 4 MG PO CP24
4.0000 mg | ORAL_CAPSULE | Freq: Every day | ORAL | 11 refills | Status: DC
Start: 2019-09-13 — End: 2020-07-03

## 2019-09-13 MED ORDER — SERTRALINE HCL 50 MG PO TABS
50.0000 mg | ORAL_TABLET | Freq: Every day | ORAL | 11 refills | Status: DC
Start: 2019-09-13 — End: 2020-07-03

## 2019-09-13 NOTE — Progress Notes (Signed)
[facility]  PATIENT: Kelly Benson DOB: May 29, 1995  REFERRING DOCTOR OR PCP: None SOURCE: Patient, emergency room and hospital admission and discharge notes, imaging and laboratory reports, MRI images personally reviewed.  _________________________________   HISTORICAL  CHIEF COMPLAINT:  Chief Complaint  Patient presents with  . Follow-up    RM 13, alone. Last seen 05/12/2019. She is doing well.  . Multiple Sclerosis    On Vumerity     HISTORY OF PRESENT ILLNESS:  Kelly Benson is a 24 y.o. 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   Update 09/13/2019: She is on Vumerity and denies any exacerbation or new symptoms.    She is walking better and is not needing any aid.  Her Lhermitte sign has resolved.   The hand numbness is much better. When very tired or sleepy, she may need her cane.   She has no recent falls.   The legs seems strong and equal but she drags her feet some when tired. She asks about a neurostimulator.    Her arms are doing well.   She has urinary urgency and occasional urge incontinence.   Bowel is doing well.    She is always fatigued, especially if more active.   She feels cognition is doing well.   She sleeps well.   She notes depression and anxiety.    She feels she hasn't been able to keep a job with her health issues.     People at work made fun at her due to her slurred speech and gait.   Sometirmes she cries when she gets home.   She gets nervous when around people.     She has halitosis.   She has seen a dentist.   Drinking water does not seem to help.      Update 05/12/19: She feels she is doing much better and she is now walking without a cane.   She has been on Vumerity since March 14, 2019.   She had a little flushing and GI upset the second and third week but has no side effects now.     Currently, her gait is more stable and she feels balance is mildly reduced.   She climbs stairs without the  bannister but uses it to go downstairs.     Strength is good in her legs.   Her Lhermitte sign resolved and she has no numbness in her hands now.   The thoracic tightness is better She has no difficulty with her bladder.    Vision is fine and she denies color desaturation.     Mood is doing ok though she feels it swings some - she feels not bad enough to start a med.    Cognition is baseline.    She notes fatigue but not sleepiness.    She sleeps well most nights.    She was asking about medical marijuana for some of her symptoms (MS Hug, mood)  From initial consult 03/03/2019: She is a 24 year old woman who was diagnosed with MS in April 2017.   In retrospect in 2015 or 2016 she had the onset of slurred speech with a lisp that mostly persisted.    In 2017, the speech was more slurred and she felt off balanced.  She went to the ED and MRI was consistent with MS.   She did not follow up with a neurologist.   She did well until 2 weeks ago.  She had a numbing sensation in her left middle finger that then increased to the 4th and 5th finger.  Over the next 2 days, numbness developed in the left body from the hand on down.   Looking down, she has a Lhermitte sign.   She went to the ED on 02/24/19, a few days after symptoms appeared.    She received 5 days of IV Solumedrol and she did PT in the hospital and occupational therapy.   She notes left sided weakness and clumsiness.    She has severe numbness in the 3rd-5th fingers on the left and moderate numbness in the left  flank and perineum.   Bladder function is worse with urge incontinence at times.    She notes that her vision is worse OD.      I personally reviewed the MRIs of the brain from 02/25/2019 and compared them to the MRI from 10/14/2018.  MRI of the brain shows multiple T2/flair hyperintense foci in the hemispheres and also a focus in the right cerebral peduncle and 1 in the left thalamus.  There are several T2 hyperintense foci in the spinal  cord.  There is an enhancing lesion to the left adjacent to C2 and another lesion posteriorly to the right at that level.  There is a posterior lesion, a little more to the left, a little more to the left at C4-C5 with subtle enhancement.  There is another focus centrally and anteriorly adjacent to C6-C7.  The focus to the left at C2 was not present on the previous MRI from June 2020.  I also reviewed her first MRI from 09/02/2015.  There are fewer foci on that MRI compared to the 2020 studies but some of the foci were enhancing at that time.Marland Kitchen  REVIEW OF SYSTEMS: Constitutional: No fevers, chills, sweats, or change in appetite Eyes: No visual changes, double vision, eye pain Ear, nose and throat: No hearing loss, ear pain, nasal congestion, sore throat Cardiovascular: No chest pain, palpitations Respiratory: No shortness of breath at rest or with exertion.   No wheezes GastrointestinaI: No nausea, vomiting, diarrhea, abdominal pain, fecal incontinence Genitourinary: No dysuria, urinary retention or frequency.  No nocturia. Musculoskeletal: No neck pain, back pain Integumentary: No rash, pruritus, skin lesions Neurological: as above Psychiatric: No depression at this time.  No anxiety Endocrine: No palpitations, diaphoresis, change in appetite, change in weigh or increased thirst Hematologic/Lymphatic: No anemia, purpura, petechiae. Allergic/Immunologic: No itchy/runny eyes, nasal congestion, recent allergic reactions, rashes  ALLERGIES: No Known Allergies  HOME MEDICATIONS:  Current Outpatient Medications:  .  Ascorbic Acid (VITAMIN C PO), Take by mouth., Disp: , Rfl:  .  Cholecalciferol (VITAMIN D-3 PO), Take by mouth. Take one daily, Disp: , Rfl:  .  Cyanocobalamin (B-12 PO), Take by mouth. Take daily, Disp: , Rfl:  .  Diroximel Fumarate (VUMERITY) 231 MG CPDR, Take 462 mg by mouth 2 (two) times daily. Take 2 capsules by mouth twice daily, Disp: 360 capsule, Rfl: 3 .  omega-3 acid  ethyl esters (LOVAZA) 1 g capsule, Take 2 g by mouth daily., Disp: , Rfl:  .  VITAMIN E PO, Take 1 Dose by mouth daily., Disp: , Rfl:  .  sertraline (ZOLOFT) 50 MG tablet, Take 1 tablet (50 mg total) by mouth daily., Disp: 30 tablet, Rfl: 11 .  tolterodine (DETROL LA) 4 MG 24 hr capsule, Take 1 capsule (4 mg total) by mouth daily., Disp: 30 capsule, Rfl: 11  PAST MEDICAL HISTORY: Past  Medical History:  Diagnosis Date  . MS (multiple sclerosis) ([place])   . Scoliosis     PAST SURGICAL HISTORY: Past Surgical History:  Procedure Laterality Date  . NO PAST SURGERIES      FAMILY HISTORY: Family History  Problem Relation Age of Onset  . Hypertension Father   . Multiple sclerosis Neg Hx     SOCIAL HISTORY:  Social History   Socioeconomic History  . Marital status: Married    Spouse name: Not on file  . Number of children: 0  . Years of education: College 15 years  . Highest education level: Not on file  Occupational History  . Occupation: [employer]  Tobacco Use  . Smoking status: Never Smoker  . Smokeless tobacco: Never Used  Substance and Sexual Activity  . Alcohol use: Yes    Comment: rare  . Drug use: No  . Sexual activity: Yes    Birth control/protection: None  Other Topics Concern  . Not on file  Social History Narrative    Right handed    Tea/soda daily   Lives with husband   Social Determinants of Health   Financial Resource Strain:   . Difficulty of Paying Living Expenses:   Food Insecurity:   . Worried About Charity fundraiser in the Last Year:   . Arboriculturist in the Last Year:   Transportation Needs:   . Film/video editor (Medical):   Marland Kitchen Lack of Transportation (Non-Medical):   Physical Activity:   . Days of Exercise per Week:   . Minutes of Exercise per Session:   Stress:   . Feeling of Stress :   Social Connections:   . Frequency of Communication with Friends and Family:   . Frequency of Social Gatherings with Friends and Family:    . Attends Religious Services:   . Active Member of Clubs or Organizations:   . Attends Archivist Meetings:   Marland Kitchen Marital Status:   Intimate Partner Violence:   . Fear of Current or Ex-Partner:   . Emotionally Abused:   Marland Kitchen Physically Abused:   . Sexually Abused:      PHYSICAL EXAM  Vitals:   09/13/19 0951  BP: 124/76  Pulse: 76  Temp: (!) 97.1 F (36.2 C)  Weight: 195 lb 8 oz (88.7 kg)  Height: 5\' 4"  (1.626 m)    Body mass index is 33.56 kg/m.   General: The patient is well-developed and well-nourished and in no acute distress  Skin: Extremities are without rash or  edema.  Neurologic Exam  Mental status: The patient is alert and oriented x 3 at the time of the examination. The patient has apparent normal recent and remote memory, with an apparently normal attention span and concentration ability.   Speech is normal.  Cranial nerves: Extraocular movements are full.  She has a mild right APD.  There is reduced color saturation OD.  Facial symmetry is present.  Facial strength is normal..  Trapezius and sternocleidomastoid strength is normal. No dysarthria is noted.  Hearing is normal and symmetric.  Motor:  Muscle bulk is normal.   Tone is normal. Strength is now 5/5.  Sensory: She has slight reduced sensation in the left fourth and fifth fingers  Coordination: Cerebellar testing reveals normal finger-nose-finger and heel-to-shin.  Gait and station: Station is normal.  Gait is fairly normal and the tandem gait is moderately wide. Romberg is positive.   Reflexes: Deep tendon reflexes are symmetric and  normal bilaterally.     DIAGNOSTIC DATA (LABS, IMAGING, TESTING) - I reviewed patient records, labs, notes, testing and imaging myself where available.  Lab Results  Component Value Date   WBC 19.4 (H) 02/28/2019   HGB 12.5 02/28/2019   HCT 39.0 02/28/2019   MCV 85.3 02/28/2019   PLT 291 02/28/2019       ASSESSMENT AND PLAN  MS (multiple  sclerosis) (HCC) - Plan: CBC with Differential/Platelet, Comprehensive metabolic panel  Gait disturbance  Numbness  High risk medication use - Plan: CBC with Differential/Platelet, Comprehensive metabolic panel  Vitamin D deficiency - Plan: VITAMIN D 25 Hydroxy (Vit-D Deficiency, Fractures)   1.   Continue Vumerity.   At next visit will check labs and schedule f/u MRI brain to determine if there is any breakthrough activity.  If she has a breakthrough activity we will consider a different disease modifying therapy. 2.  Stay active and exercise as tolerated. 3, sertraline 50 mg daily for depression/anxiety.  Detrol LA for urinary urgency.   4.  Return in 4 months or sooner for new or worsening neurologic symptoms.  Copeland Lapier A. Epimenio Foot, MD, [facility] 09/13/2019, 10:48 AM Certified in Neurology, Clinical Neurophysiology, Sleep Medicine and Neuroimaging  [facility] [address], [address] [place], [place] [address] [phone]

## 2019-09-13 NOTE — Telephone Encounter (Signed)
[employer] order sent to [facility]. They will obtain the auth and reach out to the patient to schedule.  

## 2019-09-13 NOTE — Patient Instructions (Signed)
Sertraline for mood Detrol LA for bladder.

## 2019-09-14 ENCOUNTER — Telehealth: Payer: Self-pay | Admitting: *Deleted

## 2019-09-14 DIAGNOSIS — R7989 Other specified abnormal findings of blood chemistry: Secondary | ICD-10-CM

## 2019-09-14 LAB — CBC WITH DIFFERENTIAL/PLATELET
Basophils Absolute: 0 10*3/uL (ref 0.0–0.2)
Basos: 0 %
EOS (ABSOLUTE): 0.1 10*3/uL (ref 0.0–0.4)
Eos: 1 %
Hematocrit: 38.9 % (ref 34.0–46.6)
Hemoglobin: 12.8 g/dL (ref 11.1–15.9)
Immature Grans (Abs): 0 10*3/uL (ref 0.0–0.1)
Immature Granulocytes: 0 %
Lymphocytes Absolute: 1.6 10*3/uL (ref 0.7–3.1)
Lymphs: 23 %
MCH: 27.2 pg (ref 26.6–33.0)
MCHC: 32.9 g/dL (ref 31.5–35.7)
MCV: 83 fL (ref 79–97)
Monocytes Absolute: 0.9 10*3/uL (ref 0.1–0.9)
Monocytes: 13 %
Neutrophils Absolute: 4.3 10*3/uL (ref 1.4–7.0)
Neutrophils: 63 %
Platelets: 302 10*3/uL (ref 150–450)
RBC: 4.71 x10E6/uL (ref 3.77–5.28)
RDW: 14 % (ref 11.7–15.4)
WBC: 6.8 10*3/uL (ref 3.4–10.8)

## 2019-09-14 LAB — COMPREHENSIVE METABOLIC PANEL
ALT: 12 IU/L (ref 0–32)
AST: 15 IU/L (ref 0–40)
Albumin/Globulin Ratio: 1.3 (ref 1.2–2.2)
Albumin: 4.3 g/dL (ref 3.9–5.0)
Alkaline Phosphatase: 63 IU/L (ref 39–117)
BUN/Creatinine Ratio: 23 (ref 9–23)
BUN: 12 mg/dL (ref 6–20)
Bilirubin Total: 0.5 mg/dL (ref 0.0–1.2)
CO2: 23 mmol/L (ref 20–29)
Calcium: 9.4 mg/dL (ref 8.7–10.2)
Chloride: 103 mmol/L (ref 96–106)
Creatinine, Ser: 0.53 mg/dL — ABNORMAL LOW (ref 0.57–1.00)
GFR calc Af Amer: 153 mL/min/{1.73_m2} (ref >59)
GFR calc non Af Amer: 132 mL/min/{1.73_m2} (ref >59)
Globulin, Total: 3.2 g/dL (ref 1.5–4.5)
Glucose: 74 mg/dL (ref 65–99)
Potassium: 4.4 mmol/L (ref 3.5–5.2)
Sodium: 141 mmol/L (ref 134–144)
Total Protein: 7.5 g/dL (ref 6.0–8.5)

## 2019-09-14 LAB — VITAMIN D 25 HYDROXY (VIT D DEFICIENCY, FRACTURES): Vit D, 25-Hydroxy: 21.9 ng/mL — ABNORMAL LOW (ref 30.0–100.0)

## 2019-09-14 MED ORDER — VITAMIN D (ERGOCALCIFEROL) 1.25 MG (50000 UNIT) PO CAPS
ORAL_CAPSULE | ORAL | 1 refills | Status: DC
Start: 2019-09-14 — End: 2019-10-28

## 2019-09-14 NOTE — Telephone Encounter (Signed)
-----   Message from Asa Lente, MD sent at 09/14/2019  8:51 AM EDT ----- Vitamin D is low.  Please send in a prescription for 50,000 units weekly #13 with a refill.  Have her start 5000 units OTC daily when she runs out of the high-dose supplements

## 2019-09-14 NOTE — Telephone Encounter (Signed)
Called pt and discussed results per Dr. Epimenio Foot note. She verbalized understanding. I escribed rx to [facility] on file.  Advised sertraline (for depression/anxiety) and detrol (urinary urgency) also called into pharmacy yesterday.

## 2019-10-28 ENCOUNTER — Telehealth: Payer: Self-pay | Admitting: Neurology

## 2019-10-28 DIAGNOSIS — R7989 Other specified abnormal findings of blood chemistry: Secondary | ICD-10-CM

## 2019-10-28 MED ORDER — VITAMIN D (ERGOCALCIFEROL) 1.25 MG (50000 UNIT) PO CAPS: ORAL_CAPSULE | ORAL | 6 refills | Status: DC

## 2019-10-28 NOTE — Addendum Note (Signed)
Addended by: Ann Maki on: 10/28/2019 03:34 PM   Modules accepted: Orders

## 2019-10-28 NOTE — Telephone Encounter (Signed)
Refill has been sent.  °

## 2019-10-28 NOTE — Telephone Encounter (Signed)
Patient needs PA on  Cholecalciferol (VITAMIN D-3 PO) RX has to be written for thirty day supply so her insurance will cover . Patient is out of RX. [facility] [employer] .   Patient called in

## 2020-02-28 ENCOUNTER — Telehealth: Payer: Self-pay | Admitting: *Deleted

## 2020-02-28 NOTE — Telephone Encounter (Signed)
Faxed completed/signed order for Vumerity 231mg  cap #120,3 refills to [facility] at [phone]. Received fax confirmation.

## 2020-03-15 ENCOUNTER — Encounter: Payer: Self-pay | Admitting: Neurology

## 2020-03-15 ENCOUNTER — Ambulatory Visit: Payer: Managed Care, Other (non HMO) | Admitting: Neurology

## 2020-03-16 NOTE — Telephone Encounter (Signed)
Arna Medici @ [facility] states they never received the fax from 10:18 from [facility], she is asking if it can please be refaxed to the [phone]

## 2020-03-16 NOTE — Telephone Encounter (Signed)
Number below was to [facility], not [facility]. Called [facility] at [phone]. Pt has two profiles, they were looking under the wrong profile for pt. They confirmed they do have script sent on 02/28/20. Nothing further needed.

## 2020-03-16 NOTE — Telephone Encounter (Signed)
A call back # for [facility] is [phone]

## 2020-03-16 NOTE — Telephone Encounter (Signed)
Kelly Benson- do you have the phone # they called from? We no longer have this fax, we need them to resend it

## 2020-06-26 ENCOUNTER — Telehealth: Payer: Self-pay | Admitting: Neurology

## 2020-06-26 NOTE — Telephone Encounter (Signed)
LVM for pt to call to schedule appt to discuss things with Dr. Epimenio Foot

## 2020-06-26 NOTE — Telephone Encounter (Signed)
Called pt. Offered work in this week on Wed at 930am. Pt declined, has class at that time. Scheduled appt for 07/17/20 at 2:30p with Dr. Epimenio Foot. Placed her on cx list and will call if anything else becomes available sooner. She verbalized understanding.

## 2020-06-26 NOTE — Telephone Encounter (Signed)
Kelly Benson - The Pt walked into the lobby and has a question for Dr Epimenio Foot. She is currently taking Vumerity and wanted to change because she is having issues with the medication such as forgetting to take it, down and less energized. Can you call her to discuss please by end of day today. Thank you

## 2020-07-03 ENCOUNTER — Other Ambulatory Visit: Payer: Self-pay

## 2020-07-03 ENCOUNTER — Encounter: Payer: Self-pay | Admitting: Neurology

## 2020-07-03 ENCOUNTER — Ambulatory Visit: Payer: Self-pay | Admitting: Neurology

## 2020-07-03 VITALS — BP 125/80 | HR 97 | Ht 64.0 in | Wt 207.0 lb

## 2020-07-03 DIAGNOSIS — G35 Multiple sclerosis: Secondary | ICD-10-CM

## 2020-07-03 DIAGNOSIS — Z79899 Other long term (current) drug therapy: Secondary | ICD-10-CM | POA: Insufficient documentation

## 2020-07-03 DIAGNOSIS — R269 Unspecified abnormalities of gait and mobility: Secondary | ICD-10-CM

## 2020-07-03 DIAGNOSIS — Z5989 Other problems related to housing and economic circumstances: Secondary | ICD-10-CM

## 2020-07-03 DIAGNOSIS — E559 Vitamin D deficiency, unspecified: Secondary | ICD-10-CM

## 2020-07-03 NOTE — Progress Notes (Signed)
[facility]  PATIENT: Kelly Benson DOB: 03/07/1996  REFERRING DOCTOR OR PCP: None SOURCE: Patient, emergency room and hospital admission and discharge notes, imaging and laboratory reports, MRI images personally reviewed.  _________________________________   HISTORICAL  CHIEF COMPLAINT:  Chief Complaint  Patient presents with  . Follow-up    RM 13, alone. Last seen 09/13/2019. On Vumerity for MS. Here to discuss changing DMT. Having SE from Vumerity. She is forgetting to take it. More fatigued , less motivated to do things. She stopped medication in October.  Started taking it again last week but not as prescribed. Felt she was dizzy/balance issues but this has since resolved.    HISTORY OF PRESENT ILLNESS:  Kelly Benson is a 25 y.o. 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   Update 07/03/2020: She is on Vumerity but missed some doses.    She felt better off the medication than when she was on it so she mostly stopped in November 2021 and then restarted last week.  She went back on because she felt some of the old stuff came back.   She does not have insurance.   Her last Vumerity was last night.      She is walking better and is not needing any aid.  However, when more tired she feels off balanced at times   She can wear heels in the mornings.  She goes to the [facility] mornings.   She could easily walk a mile unless very tired.  Her Lhermitte sign has resolved.   The hand numbness resolved.     She has no recent falls.   Strength is fine.  She has urinary urgency and occasional urge incontinence.   Bowel is doing well.    She is always fatigued, especially if more active.   She feels cognition is doing well.   She sleeps well.   She notes depression and anxiety.    She feels she hasn't been able to keep a job with her health issues.     People at work made fun at her due to her slurred speech and gait.   Sometirmes she cries when she  gets home.   She gets nervous when around people.     She had halitosis resolved by a water-Pick .   Mood has been better since the halitosis resolved and she was able to stop.    She had an EKG 02/25/2019 for some palpitations but her evaluation was fine.     She has done her Covid 88 Moderna vaccination around August Jeraldine Loots 2021.     MS History She  was diagnosed with MS in April 2017.   In 2017, the speech was more slurred and she felt off balanced.  She went to the ED and MRI was consistent with MS.   She did not follow up with a neurologist.   She did well until 2 weeks ago.    She had a numbing sensation in her left middle finger that then increased to the 4th and 5th finger.  Over the next 2 days, numbness developed in the left body from the hand on down.   Looking down, she has a Lhermitte sign.    She went to the ED on 02/24/19, a few days after symptoms appeared.    She received 5 days of IV Solumedrol and she did PT in the hospital and occupational therapy.   She notes left sided weakness and  clumsiness.    She has severe numbness in the 3rd-5th fingers on the left and moderate numbness in the left  flank and perineum.   Bladder function is worse with urge incontinence at times.    She notes that her vision is worse OD.      IMAGING MRIs of the brain from 02/25/2019 and compared them to the MRI from 10/14/2018.  MRI of the brain shows multiple T2/flair hyperintense foci in the hemispheres and also a focus in the right cerebral peduncle and 1 in the left thalamus.  There are several T2 hyperintense foci in the spinal cord.   I also reviewed her first MRI from 09/02/2015.  There are fewer foci on that MRI compared to the 2020 studies but some of the foci were enhancing at that time.Marland Kitchen  MRI of the cervical spine 02/25/2019 showed an enhancing lesion to the left adjacent to C2 and another lesion posteriorly to the right at that level.  There is a posterior lesion, a little more to the left, a  little more to the left at C4-C5 with subtle enhancement.  There is another focus centrally and anteriorly adjacent to C6-C7.  The focus to the left at C2 was not present on the previous MRI from June 2020.    REVIEW OF SYSTEMS: Constitutional: No fevers, chills, sweats, or change in appetite Eyes: No visual changes, double vision, eye pain Ear, nose and throat: No hearing loss, ear pain, nasal congestion, sore throat Cardiovascular: No chest pain, palpitations Respiratory: No shortness of breath at rest or with exertion.   No wheezes GastrointestinaI: No nausea, vomiting, diarrhea, abdominal pain, fecal incontinence Genitourinary: No dysuria, urinary retention or frequency.  No nocturia. Musculoskeletal: No neck pain, back pain Integumentary: No rash, pruritus, skin lesions Neurological: as above Psychiatric: No depression at this time.  No anxiety Endocrine: No palpitations, diaphoresis, change in appetite, change in weigh or increased thirst Hematologic/Lymphatic: No anemia, purpura, petechiae. Allergic/Immunologic: No itchy/runny eyes, nasal congestion, recent allergic reactions, rashes  ALLERGIES: No Known Allergies  HOME MEDICATIONS:  Current Outpatient Medications:  Marland Kitchen  Diroximel Fumarate (VUMERITY) 231 MG CPDR, Take 462 mg by mouth 2 (two) times daily. Take 2 capsules by mouth twice daily, Disp: 360 capsule, Rfl: 3 .  VITAMIN E PO, Take 1 Dose by mouth daily., Disp: , Rfl:   PAST MEDICAL HISTORY: Past Medical History:  Diagnosis Date  . MS (multiple sclerosis) (HCC)   . Scoliosis     PAST SURGICAL HISTORY: Past Surgical History:  Procedure Laterality Date  . NO PAST SURGERIES      FAMILY HISTORY: Family History  Problem Relation Age of Onset  . Hypertension Father   . Multiple sclerosis Neg Hx     SOCIAL HISTORY:  Social History   Socioeconomic History  . Marital status: Married    Spouse name: Not on file  . Number of children: 0  . Years of  education: College 15 years  . Highest education level: Not on file  Occupational History  . Occupation: [employer]  Tobacco Use  . Smoking status: Never Smoker  . Smokeless tobacco: Never Used  Substance and Sexual Activity  . Alcohol use: Yes    Comment: rare  . Drug use: No  . Sexual activity: Yes    Birth control/protection: None  Other Topics Concern  . Not on file  Social History Narrative    Right handed    Tea/soda daily   Lives with husband  Social Determinants of Health   Financial Resource Strain: Not on file  Food Insecurity: Not on file  Transportation Needs: Not on file  Physical Activity: Not on file  Stress: Not on file  Social Connections: Not on file  Intimate Partner Violence: Not on file     PHYSICAL EXAM  Vitals:   07/03/20 0840  BP: 125/80  Pulse: 97  SpO2: 98%  Weight: 207 lb (93.9 kg)  Height: 5\' 4"  (1.626 m)    Body mass index is 35.53 kg/m.  VA:   OD 20/30   OS 20/25  General: The patient is well-developed and well-nourished and in no acute distress  Skin: Extremities are without rash or  edema.  Neurologic Exam  Mental status: The patient is alert and oriented x 3 at the time of the examination. The patient has apparent normal recent and remote memory, with an apparently normal attention span and concentration ability.   Speech is normal.  Cranial nerves: Extraocular movements are full.  She has a mild right APD.  There is reduced color saturation OD.  Facial symmetry is present.  Facial strength is normal..  Trapezius and sternocleidomastoid strength is normal. No dysarthria is noted.  Hearing is normal and symmetric.  Motor:  Muscle bulk is normal.   Tone is normal. Strength is now 5/5.  Sensory: She has slight reduced sensation in the left fourth and fifth fingers  Coordination: Cerebellar testing reveals normal finger-nose-finger and heel-to-shin.  Gait and station: Station is normal.  Gait is fairly normal and the  tandem gait is moderately wide. Romberg is positive.   Reflexes: Deep tendon reflexes are symmetric and normal bilaterally.   EDSS:   1 point cerebellar (tandem); 1 ambulation (not baseline but can go 1500 M in 20 min), 1 bladder urgency (rare inc < 1/month); vision 2.     DIAGNOSTIC DATA (LABS, IMAGING, TESTING) - I reviewed patient records, labs, notes, testing and imaging myself where available.  Lab Results  Component Value Date   WBC 6.8 09/13/2019   HGB 12.8 09/13/2019   HCT 38.9 09/13/2019   MCV 83 09/13/2019   PLT 302 09/13/2019       ASSESSMENT AND PLAN  MS (multiple sclerosis) (HCC) - Plan: CBC with Differential/Platelet, Varicella zoster antibody, IgG  High risk medication use - Plan: CBC with Differential/Platelet, Varicella zoster antibody, IgG  Vitamin D deficiency  Gait disturbance  Does not have health insurance   1.   She would prefer a once a day pill.   We discussed the S1PR1 modulators.   We discussed Zeposia.  She is potentially interested in that medication and also is potentially interested in participating in the open label drug study.  I will check a varicella-zoster and CBC to make sure that she is a candidate.  If so we will start Zeposia either in the drug study or outside of that.  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 2.  Stay active and exercise as tolerated. 3.  return in 6 months or sooner for new or worsening neurologic symptoms.  Richard A. 11/13/2019, MD, [facility] 07/03/2020, 1:01 PM Certified in Neurology, Clinical Neurophysiology, Sleep Medicine and Neuroimaging  [facility] [address], [address] [place], [place] [phone]

## 2020-07-04 LAB — CBC WITH DIFFERENTIAL/PLATELET
Basophils Absolute: 0 10*3/uL (ref 0.0–0.2)
Basos: 0 %
EOS (ABSOLUTE): 0.1 10*3/uL (ref 0.0–0.4)
Eos: 1 %
Hematocrit: 40.4 % (ref 34.0–46.6)
Hemoglobin: 13.2 g/dL (ref 11.1–15.9)
Immature Grans (Abs): 0 10*3/uL (ref 0.0–0.1)
Immature Granulocytes: 0 %
Lymphocytes Absolute: 1.4 10*3/uL (ref 0.7–3.1)
Lymphs: 18 %
MCH: 27 pg (ref 26.6–33.0)
MCHC: 32.7 g/dL (ref 31.5–35.7)
MCV: 83 fL (ref 79–97)
Monocytes Absolute: 0.8 10*3/uL (ref 0.1–0.9)
Monocytes: 10 %
Neutrophils Absolute: 5.7 10*3/uL (ref 1.4–7.0)
Neutrophils: 71 %
Platelets: 277 10*3/uL (ref 150–450)
RBC: 4.89 x10E6/uL (ref 3.77–5.28)
RDW: 12.4 % (ref 11.7–15.4)
WBC: 8 10*3/uL (ref 3.4–10.8)

## 2020-07-04 LAB — VARICELLA ZOSTER ANTIBODY, IGG: Varicella zoster IgG: 511 index (ref >165)

## 2020-07-17 ENCOUNTER — Ambulatory Visit: Payer: Self-pay | Admitting: Neurology

## 2020-09-04 ENCOUNTER — Telehealth: Payer: Self-pay | Admitting: Neurology

## 2020-09-04 ENCOUNTER — Encounter: Payer: Self-pay | Admitting: *Deleted

## 2020-09-04 NOTE — Telephone Encounter (Signed)
Kelly Benson, can you reach back out to her to see what she is needing? Does a form need to be filled out from her work?

## 2020-09-04 NOTE — Telephone Encounter (Signed)
I spoke to the patient. She would like to pick up a written statement allowing her to avoid assignments in extreme heat.  Says right now she is able to drink water and sit for period of rest without any issues. She does not wish for this information to be in the letter.  She would like to pick up a signed statement. Dr. Terrace Arabia signed since Dr. Epimenio Foot is out of the office.  It as been placed up front and she will come by our office to pick up.

## 2020-09-04 NOTE — Telephone Encounter (Signed)
I just reached back out to pt. She does not have a form from her job, she states she just needs a note stating she can't be in excessive heat, no standing for more than 3 hrs at a time, and needs to be able to drink water and take meds as needed, Name of company is [employer].

## 2020-09-04 NOTE — Telephone Encounter (Signed)
Pt. Came into the lobby today stating that she needs a limitations note for her new job. Best call back is [phone]

## 2020-10-28 ENCOUNTER — Emergency Department (HOSPITAL_COMMUNITY): Payer: Self-pay

## 2020-10-28 ENCOUNTER — Other Ambulatory Visit: Payer: Self-pay

## 2020-10-28 ENCOUNTER — Emergency Department (HOSPITAL_COMMUNITY)
Admission: EM | Admit: 2020-10-28 | Discharge: 2020-10-28 | Discharge status: Against Medical Advice | Payer: Self-pay | Attending: Emergency Medicine | Admitting: Emergency Medicine

## 2020-10-28 DIAGNOSIS — R1010 Upper abdominal pain, unspecified: Secondary | ICD-10-CM | POA: Insufficient documentation

## 2020-10-28 DIAGNOSIS — Z5321 Procedure and treatment not carried out due to patient leaving prior to being seen by health care provider: Secondary | ICD-10-CM | POA: Insufficient documentation

## 2020-10-28 LAB — CBC WITH DIFFERENTIAL/PLATELET
Abs Immature Granulocytes: 0.02 10*3/uL (ref 0.00–0.07)
Basophils Absolute: 0 10*3/uL (ref 0.0–0.1)
Basophils Relative: 1 %
Eosinophils Absolute: 0.1 10*3/uL (ref 0.0–0.5)
Eosinophils Relative: 1 %
HCT: 38.4 % (ref 36.0–46.0)
Hemoglobin: 12.5 g/dL (ref 12.0–15.0)
Immature Granulocytes: 0 %
Lymphocytes Relative: 26 %
Lymphs Abs: 1.7 10*3/uL (ref 0.7–4.0)
MCH: 27.4 pg (ref 26.0–34.0)
MCHC: 32.6 g/dL (ref 30.0–36.0)
MCV: 84.2 fL (ref 80.0–100.0)
Monocytes Absolute: 0.7 10*3/uL (ref 0.1–1.0)
Monocytes Relative: 11 %
Neutro Abs: 3.9 10*3/uL (ref 1.7–7.7)
Neutrophils Relative %: 61 %
Platelets: 285 10*3/uL (ref 150–400)
RBC: 4.56 MIL/uL (ref 3.87–5.11)
RDW: 14 % (ref 11.5–15.5)
WBC: 6.4 10*3/uL (ref 4.0–10.5)
nRBC: 0 % (ref 0.0–0.2)

## 2020-10-28 LAB — COMPREHENSIVE METABOLIC PANEL
ALT: 12 U/L (ref 0–44)
AST: 17 U/L (ref 15–41)
Albumin: 3.9 g/dL (ref 3.5–5.0)
Alkaline Phosphatase: 47 U/L (ref 38–126)
Anion gap: 7 (ref 5–15)
BUN: 12 mg/dL (ref 6–20)
CO2: 24 mmol/L (ref 22–32)
Calcium: 9 mg/dL (ref 8.9–10.3)
Chloride: 105 mmol/L (ref 98–111)
Creatinine, Ser: 0.62 mg/dL (ref 0.44–1.00)
GFR, Estimated: >60 mL/min (ref >60)
Glucose, Bld: 85 mg/dL (ref 70–99)
Potassium: 4.1 mmol/L (ref 3.5–5.1)
Sodium: 136 mmol/L (ref 135–145)
Total Bilirubin: 0.7 mg/dL (ref 0.3–1.2)
Total Protein: 7.1 g/dL (ref 6.5–8.1)

## 2020-10-28 LAB — BRAIN NATRIURETIC PEPTIDE: B Natriuretic Peptide: 6.3 pg/mL (ref 0.0–100.0)

## 2020-10-28 LAB — TROPONIN I (HIGH SENSITIVITY): Troponin I (High Sensitivity): 3 ng/L (ref <18)

## 2020-10-28 LAB — I-STAT BETA HCG BLOOD, ED (MC, WL, AP ONLY): I-stat hCG, quantitative: <5 m[IU]/mL (ref <5)

## 2020-10-28 LAB — LIPASE, BLOOD: Lipase: 30 U/L (ref 11–51)

## 2020-10-28 MED ORDER — LORAZEPAM 1 MG PO TABS: 1.0000 mg | ORAL_TABLET | Freq: Once | ORAL | Status: DC

## 2020-10-28 NOTE — ED Provider Notes (Addendum)
Emergency Medicine Provider Triage Evaluation Note  Kelly Benson , a 25 y.o. female  was evaluated in triage.  Pt complains of chest pain and shortness of breath onset 3 years ago.  Patient reports it has been constant since that time.  She reports she has had numerous work-ups without any answers.  States that she feels that she can sense that shortness of breath and her lower abdomen denies a history of anxiety she does have a history of multiple sclerosis and reports compliance with her medications for this.  No specific aggravating or alleviating factors.  Review of Systems  Positive: Chest pain, shortness of breath Negative: Fevers, chills, cough  Physical Exam  BP (!) 147/83 (BP Location: Left Arm)   Pulse 81   Temp 99.3 F (37.4 C) (Oral)   Resp 20   SpO2 100%  Gen:   Awake, no distress   Resp:  Normal effort  MSK:   Moves extremities without difficulty  Other:  Clear and equal breath sounds, well-appearing, no respiratory distress  Medical Decision Making  Medically screening exam initiated at 3:12 AM.  Appropriate orders placed.  Kelly Benson was informed that the remainder of the evaluation will be completed by another provider, this initial triage assessment does not replace that evaluation, and the importance of remaining in the ED until their evaluation is complete.  Chest pain and shortness of breath     Milta Deiters 10/28/20 6761    Nira Conn, MD 10/30/20 [phone]

## 2020-10-28 NOTE — ED Notes (Signed)
Pt felt fine so pt left

## 2020-10-28 NOTE — ED Triage Notes (Signed)
Pt reports for the last 3 years has had ongoing SOB, anxiety type symptoms. Pt reports tonight the pain was in upper abdomen which is new and different from prior episodes. Pt reports she feels like she can't get air. Pt speaking in full sentences. Has been evaluated for this in the past and they havent been able to find anything.

## 2021-01-03 ENCOUNTER — Telehealth: Payer: Self-pay | Admitting: Neurology

## 2021-01-03 ENCOUNTER — Encounter: Payer: Self-pay | Admitting: Neurology

## 2021-01-03 NOTE — Telephone Encounter (Signed)
Liz Beach nurse educator for [employer] called wanting to make sure that the provider is informed that the pt has stoped the Vumerity a month ago due to chest pain and shortness of breath. If any questions you can call Clydie Braun at [phone]

## 2021-01-03 NOTE — Telephone Encounter (Signed)
Noted, we did not have this on file that patient had stopped. I have sent the patient a message on mychart to check in.  Pt needs to schedule a apt with Dr Epimenio Foot as well

## 2021-01-23 ENCOUNTER — Telehealth: Payer: Self-pay | Admitting: Neurology

## 2021-01-23 NOTE — Telephone Encounter (Signed)
Called the pt back and advised Dr Epimenio Foot would want to encourage the patient stick it through with the Vumerity. Advised if changes were needed to be discussed than an apt would be necessary to discuss. A letter can be written to cover her absence from work. Pt will pick up in the morning.

## 2021-01-23 NOTE — Telephone Encounter (Signed)
Pt asking to be called instead of Mychart message.

## 2021-01-23 NOTE — Telephone Encounter (Signed)
Patient wants to discuss a situation she is having with the medication she is taking for MS. The Pt spoke to the nurse months ago about getting shortness of breathe while taking the medication which caused her to be out of work for a few days. She stopped taking the medication because of the shortness of breathe which started again, then was getting MS symptoms so started taking the meds again. This caused shortness of breathe and she was out of work. She is looking for a letter regarding her condition for work. She also wants to see what can be done about the medication and shortness of breathe vs MS symptoms(Medication Vumerity).

## 2021-01-23 NOTE — Telephone Encounter (Addendum)
Called the patient back to inform her I reviewed the symptoms she described.  I advised that it may be best that we bring her in for a follow-up visit since February was the last time she was seen to be able to discuss her symptoms and side effects along with discussing alternative medications if need be.  Patient states that at this time she is without insurance and unable to afford coming in for a visit. She states that she had stopped the medication previously because shortness of breath was being exacerbated when she was on the Vumerity but her MS symptoms were better.  Early September she started to develop trouble walking, speech was slurred and an increase in fatigue.  She ruled out COVID and pregnancy and figured this was an MS related symptoms.  She called to get reestablished on the Vumerity and they did send the medication.  She states that she started the medicine back on around 01/18/21 but was not 100% sure that was the correct date.  Since starting the medication she describes her MS symptoms have basically resolved however the shortness of breath is worsened once again.  I questioned if the patient knew whether her oxygen level was dropping, she has not evaluated her oxygen levels.  I recommended seeing if she knows anyone that has a pulse oximeter so that if she starts to feel like she cannot breathe she can evaluate her oxygen levels to ensure that they are still up.  With all that said, when she was experiencing the symptoms she had to be out of work.  Patient states that she has been out of work since 9/7 but would like to return to work tomorrow 9/14.  Her work is insisting that she has a letter to cover her during the time that she was out. I advised the patient that I would discuss with Dr. Epimenio Foot to see if he is okay with writing the letter to cover her.  I also advised I would discuss the Vumerity and shortness of breath exacerbation and see if he has any recommendations or alternative  therapy to recommend.  Advised that either Dr. Epimenio Foot or myself will be in contact. I also asked the patient if she was in the works with getting patient assistance set up and she states that she is.

## 2021-01-31 ENCOUNTER — Other Ambulatory Visit: Payer: Self-pay | Admitting: Neurology

## 2021-01-31 DIAGNOSIS — G35 Multiple sclerosis: Secondary | ICD-10-CM

## 2021-02-20 ENCOUNTER — Telehealth: Payer: Self-pay | Admitting: Neurology

## 2021-02-20 DIAGNOSIS — G35 Multiple sclerosis: Secondary | ICD-10-CM

## 2021-02-20 MED ORDER — VUMERITY 231 MG PO CPDR: 462.0000 mg | DELAYED_RELEASE_CAPSULE | Freq: Two times a day (BID) | ORAL | 0 refills | Status: DC

## 2021-02-20 NOTE — Telephone Encounter (Signed)
Called the patient back and made her aware that the refill has been sent for her to [facility]. Advised that we needed to get her scheduled for a f/u visit and we were able to do that. Pt verbalized understanding. Pt had no questions at this time but was encouraged to call back if questions arise.]

## 2021-02-20 NOTE — Telephone Encounter (Signed)
Pt called wanting to know why there was a decline on her refill for Diroximel Fumarate (VUMERITY) 231 MG CPDR. Pt requesting a call back.

## 2021-02-20 NOTE — Telephone Encounter (Signed)
I have resent the script for the patient to [facility]. Pt does need to schedule a follow up visit for future refills.

## 2021-02-26 ENCOUNTER — Telehealth: Payer: Self-pay | Admitting: Neurology

## 2021-02-26 NOTE — Telephone Encounter (Signed)
Called [facility] and spoke w/ Theora Gianotti. They did not receive rx e-scribed 02/20/21 #360. Last one on file from 08/2019.  I provided VO to [employer] Physiological scientist) for rx. Asked it to be expedited. She will do this for pt.   Called pt back at [phone]. Verified this is correct phone #. Verified address: [address]  [place] [address]. She will call back tomorrow if she has any more issues processing refill request.

## 2021-02-26 NOTE — Telephone Encounter (Signed)
Pt is asking for a call from RN to discuss that she is having difficulty ordering her Diroximel Fumarate (VUMERITY) 231 MG CPDR

## 2021-02-28 NOTE — Telephone Encounter (Signed)
Took call from phone staff and spoke w/ Kelly Benson/[facility]. They were able to reach pt and connect her w/ [employer]. She still qualifies for free drug. Aware prescription called into [facility] for pt. Nothing further needed at this time. Pt should be able to refill medication now.

## 2021-02-28 NOTE — Telephone Encounter (Signed)
Pt called and spoke w/ Jael in phone room to report she is still having difficulty filling rx Vumerity w/ [facility]. Had Jael inform pt we will reach out to pharmacy and then call her back w/ update.   I called [facility] and spoke w/ Latoya. Confirmed they have correct address/phone # on file. Showing pt un-enrolled for medication, no reason why. She placed me on hold to speak w/ specialist. Un-enrolled d/t Vumerity not covered under her medical benefit. Un-enrolled 02/27/21. [facility]erred specialty [facility]: [facility] Phone #: [phone]. Fax# [phone]. Rx transferred to them.  I called [facility]. Spoke w/ Jonny Ruiz. Transferred me to free drug program who handles prescription for pt. Spoke w/ Victorino Dike w/ [employer]. They have not filled anything for her since 01/18/21. They tried calling pt but mailbox full. Showing refill denied/not appropriate on 01/31/21. She placed me on hold to speak w/ leadership team. They are needing new rx/need to speak w/ pt to get her back enrolled w/ [employer] for assistance/do new benefit investigation. I placed her on hold to try and reach out to pt. I called pt x2. Mailbox full, unable to LVM. I relayed this to [place]. She will try to reach pt, if unable, she will let us know.  I called pharmacy ([facility]) at [phone] option 2. Spoke w/ pharmacist,Scott. Provided VO for Vumerity 231mg , directions: 2 caps po BID #[id number], 2 refills. They will process for pt. Nothing further needed.

## 2021-03-07 NOTE — Telephone Encounter (Signed)
Error

## 2021-03-12 ENCOUNTER — Telehealth: Payer: Self-pay | Admitting: Neurology

## 2021-03-12 ENCOUNTER — Encounter: Payer: Self-pay | Admitting: Neurology

## 2021-03-12 ENCOUNTER — Ambulatory Visit (INDEPENDENT_AMBULATORY_CARE_PROVIDER_SITE_OTHER): Payer: Self-pay | Admitting: Neurology

## 2021-03-12 VITALS — BP 128/78 | HR 95 | Ht 64.0 in | Wt 193.5 lb

## 2021-03-12 DIAGNOSIS — R200 Anesthesia of skin: Secondary | ICD-10-CM

## 2021-03-12 DIAGNOSIS — Z79899 Other long term (current) drug therapy: Secondary | ICD-10-CM

## 2021-03-12 DIAGNOSIS — G35 Multiple sclerosis: Secondary | ICD-10-CM

## 2021-03-12 DIAGNOSIS — R269 Unspecified abnormalities of gait and mobility: Secondary | ICD-10-CM

## 2021-03-12 NOTE — Telephone Encounter (Signed)
Kelly Benson is asking for a Rx for Diroximel Fumarate (VUMERITY) 231 MG CPDR to be sent to them ph [phone] fax [phone]

## 2021-03-12 NOTE — Progress Notes (Signed)
[facility]  PATIENT: Kelly Benson DOB: 1996-03-20  REFERRING DOCTOR OR PCP: None SOURCE: Patient, emergency room and hospital admission and discharge notes, imaging and laboratory reports, MRI images personally reviewed.  _________________________________   HISTORICAL  CHIEF COMPLAINT:  Chief Complaint  Patient presents with   Follow-up    Rm 2, alone. Here for 6 month MS f/u, on Vumerity. Pt reports having bad SOB beginning of year and pt Stopped taking her Vumerity for 2.5 months,pt felt better and MS was stable at that time. Pt then started to have new sx: R foot drop. Issues keeping R hand steady and tongue numbness on one side. Hand writing has worsened. Severe sleepiness. Head and hand shaking. Turning head suddenly triggers sx. Cold feeling in head.     HISTORY OF PRESENT ILLNESS:  Kelly Benson is a 25 y.o. 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   Update 03/12/2021: She was experiencing shortness of breath so she stopped the Vumerity.   She felt better off the medication for a while.   She then began to feel more fatigued and then in August 2022, had the onset of numbness and reduced coordination, right worse than left.  Her handwriting worsened ad she noted shaking..  The new symptoms likely represent an exacerbation.  Her walking is more stiff and she has some difficulty lifting the right foot.    She is better now than a month ago.   She was off Vumerity x 2 months.  She is back on Vumerity and now tolerates it better.     Her last MRI's were in 2020 (April and October and April)  and she had enhancing lesions in both the brain and cervical spine.  I saw her after these MRIs and initiated Vumerity.  She has never been on another DMT.  Compared to her presentation in 2020, she is walking better and is not needing any aid (at the time she needed a walker) gait is worse when she is more tired..     She could walk a mile  unless very tired.  Her Lhermitte sign is much better than in 2020.   The hand numbness resolved.     She has no recent falls.   Strength is fine.  She has urinary urgency and occasional urge incontinence.   Bowel is doing well.    She is always fatigued, especially if more active.   She feels cognition is doing well.   She sleeps well.   She notes depression and anxiety.    She feels she hasn't been able to keep a job with her health issues.     People at work made fun at her due to her slurred speech and gait.   Sometirmes she cries when she gets home.   She gets nervous when around people.     Mood has been better since the halitosis resolved and she was able to stop.    She had an EKG 02/25/2019 for some palpitations but her evaluation was fine.       MS History She  was diagnosed with MS in April 2017.   In 2017, the speech was more slurred and she felt off balanced.  She went to the ED and MRI was consistent with MS.   She did not follow up with a neurologist.   She did well until 2 weeks ago.    She had a numbing sensation in her left middle  finger that then increased to the 4th and 5th finger.  Over the next 2 days, numbness developed in the left body from the hand on down.   Looking down, she has a Lhermitte sign.    She went to the ED on 02/24/19, a few days after symptoms appeared.    She received 5 days of IV Solumedrol and she did PT in the hospital and occupational therapy.   She notes left sided weakness and clumsiness.    She has severe numbness in the 3rd-5th fingers on the left and moderate numbness in the left  flank and perineum.   Bladder function is worse with urge incontinence at times.    She notes that her vision is worse OD.      IMAGING MRIs of the brain from 02/25/2019 and compared them to the MRI from 10/14/2018.  MRI of the brain shows multiple T2/flair hyperintense foci in the hemispheres and also a focus in the right cerebral peduncle and 1 in the left thalamus.  There are  several T2 hyperintense foci in the spinal cord.   I also reviewed her first MRI from 09/02/2015.  There are fewer foci on that MRI compared to the 2020 studies but some of the foci were enhancing at that time.Marland Kitchen  MRI of the cervical spine 02/25/2019 showed an enhancing lesion to the left adjacent to C2 and another lesion posteriorly to the right at that level.  There is a posterior lesion, a little more to the left, a little more to the left at C4-C5 with subtle enhancement.  There is another focus centrally and anteriorly adjacent to C6-C7.  The focus to the left at C2 was not present on the previous MRI from June 2020.    REVIEW OF SYSTEMS: Constitutional: No fevers, chills, sweats, or change in appetite Eyes: No visual changes, double vision, eye pain Ear, nose and throat: No hearing loss, ear pain, nasal congestion, sore throat Cardiovascular: No chest pain, palpitations Respiratory:  No shortness of breath at rest or with exertion.   No wheezes GastrointestinaI: No nausea, vomiting, diarrhea, abdominal pain, fecal incontinence Genitourinary:  No dysuria, urinary retention or frequency.  No nocturia. Musculoskeletal:  No neck pain, back pain Integumentary: No rash, pruritus, skin lesions Neurological: as above Psychiatric: No depression at this time.  No anxiety Endocrine: No palpitations, diaphoresis, change in appetite, change in weigh or increased thirst Hematologic/Lymphatic:  No anemia, purpura, petechiae. Allergic/Immunologic: No itchy/runny eyes, nasal congestion, recent allergic reactions, rashes  ALLERGIES: No Known Allergies  HOME MEDICATIONS:  Current Outpatient Medications:    Diroximel Fumarate (VUMERITY) 231 MG CPDR, Take 462 mg by mouth 2 (two) times daily. Take 2 capsules by mouth twice daily, Disp: 360 capsule, Rfl: 0   VITAMIN E PO, Take 1 Dose by mouth daily., Disp: , Rfl:   PAST MEDICAL HISTORY: Past Medical History:  Diagnosis Date   MS (multiple sclerosis)  (HCC)    Scoliosis     PAST SURGICAL HISTORY: Past Surgical History:  Procedure Laterality Date   NO PAST SURGERIES      FAMILY HISTORY: Family History  Problem Relation Age of Onset   Hypertension Father    Multiple sclerosis Neg Hx     SOCIAL HISTORY:  Social History   Socioeconomic History   Marital status: Married    Spouse name: Not on file   Number of children: 0   Years of education: College 15 years   Highest education level: Not on file  Occupational History   Occupation: Banker  Tobacco Use   Smoking status: Never   Smokeless tobacco: Never  Substance and Sexual Activity   Alcohol use: Yes    Comment: rare   Drug use: No   Sexual activity: Yes    Birth control/protection: None  Other Topics Concern   Not on file  Social History Narrative    Right handed    Tea/soda daily   Lives with husband   Social Determinants of Health   Financial Resource Strain: Not on file  Food Insecurity: Not on file  Transportation Needs: Not on file  Physical Activity: Not on file  Stress: Not on file  Social Connections: Not on file  Intimate Partner Violence: Not on file     PHYSICAL EXAM  Vitals:   03/12/21 1317  BP: 128/78  Pulse: 95  SpO2: 98%  Weight: 193 lb 8 oz (87.8 kg)  Height: 5\' 4"  (1.626 m)    Body mass index is 33.21 kg/m.  VA:   OD 20/30   OS 20/25  General: The patient is well-developed and well-nourished and in no acute distress  Skin: Extremities are without rash or  edema.  Neurologic Exam  Mental status: The patient is alert and oriented x 3 at the time of the examination. The patient has apparent normal recent and remote memory, with an apparently normal attention span and concentration ability.   Speech is normal.  Cranial nerves: Extraocular movements are full.  She has a mild right APD.  There is reduced color saturation OD.  Facial symmetry is present.  Facial strength is normal..  Trapezius and sternocleidomastoid  strength is normal. No dysarthria is noted.  Hearing is normal and symmetric.  Motor:  Muscle bulk is normal.   Tone is normal. Strength is now 5/5.  Sensory: She has slight reduced sensation in the left fourth and fifth fingers  Coordination: Cerebellar testing reveals normal finger-nose-finger and heel-to-shin.  Gait and station: Station is normal.  Gait is mildly wide and the tandem gait is moderately wide. Romberg is positive.   Reflexes: Deep tendon reflexes are symmetric and normal bilaterally.   EDSS:   2.0 --- 1 point cerebellar (gait); 1 ambulation (not baseline but can go 1500 M in 20 min), 1 bladder urgency (rare inc < 1/month); vision 2.     DIAGNOSTIC DATA (LABS, IMAGING, TESTING) - I reviewed patient records, labs, notes, testing and imaging myself where available.  Lab Results  Component Value Date   WBC 6.4 10/28/2020   HGB 12.5 10/28/2020   HCT 38.4 10/28/2020   MCV 84.2 10/28/2020   PLT 285 10/28/2020       ASSESSMENT AND PLAN  MS (multiple sclerosis) (HCC) - Plan: MR BRAIN W WO CONTRAST, MR CERVICAL SPINE W WO CONTRAST  High risk medication use  Gait disturbance - Plan: MR BRAIN W WO CONTRAST, MR CERVICAL SPINE W WO CONTRAST  Numbness - Plan: MR BRAIN W WO CONTRAST, MR CERVICAL SPINE W WO CONTRAST   1.   She would prefer a once a day pill.  Additionally, she has not felt great on Vumerity..   We discussed the S1PR1 modulators.   We discussed Zeposia.  She is interested in that medication and also is potentially interested in participating in the open label drug study.  We will start Zeposia either in the drug study or outside if she does not qualify.  Check MRI of the brain (may be part of her  screening visit)  2.  Stay active and exercise as tolerated. 3.  return in 6 months or sooner if she enters a study or for new or worsening neurologic symptoms.  Jelisa Sanford A. Epimenio Foot, MD, [facility] 03/12/2021, 4:58 PM Certified in Neurology, Clinical Neurophysiology,  Sleep Medicine and Neuroimaging  [facility] [address] [place], [place] [address] [phone]

## 2021-03-12 NOTE — Telephone Encounter (Signed)
Per the visit that was had with the patient today she will be switching to Zeposia potentially. Dr Epimenio Foot states should hold off on refill of the Vumerity for now.  If pharmacy calls back please advise that RX at this time is Dc'd

## 2021-03-15 ENCOUNTER — Telehealth: Payer: Self-pay

## 2021-03-15 DIAGNOSIS — G35 Multiple sclerosis: Secondary | ICD-10-CM

## 2021-03-15 MED ORDER — VUMERITY 231 MG PO CPDR: 462.0000 mg | DELAYED_RELEASE_CAPSULE | Freq: Two times a day (BID) | ORAL | 0 refills | Status: DC

## 2021-03-15 NOTE — Telephone Encounter (Signed)
Received zeposia start form from Dr. Epimenio Foot. Patient does not qualify for Zeposia study because she was diagnosed > 5 years ago.  I called patient. She is uninsured. However, she is not interested in starting zeposia any longer. She is concerned about the long term effects. She would like to remain on Vumerity and needs a refill.  I spoke with Dr. Epimenio Foot. He is agreeable to this plan. Rx for Vumerity sent to [facility].

## 2021-06-06 ENCOUNTER — Other Ambulatory Visit: Payer: Self-pay | Admitting: Neurology

## 2021-06-06 DIAGNOSIS — G35 Multiple sclerosis: Secondary | ICD-10-CM

## 2021-10-31 ENCOUNTER — Other Ambulatory Visit: Payer: Self-pay | Admitting: Neurology

## 2021-10-31 DIAGNOSIS — G35 Multiple sclerosis: Secondary | ICD-10-CM

## 2021-12-24 ENCOUNTER — Telehealth: Payer: Self-pay | Admitting: Neurology

## 2021-12-24 DIAGNOSIS — G35 Multiple sclerosis: Secondary | ICD-10-CM

## 2021-12-24 MED ORDER — VUMERITY 231 MG PO CPDR: 2.0000 | DELAYED_RELEASE_CAPSULE | Freq: Two times a day (BID) | ORAL | 0 refills | Status: DC

## 2021-12-24 NOTE — Telephone Encounter (Signed)
Pt is calling and asking if the office have any sample of Diroximel Fumarate (VUMERITY) 231 MG CPDR because she doesn't have a co-pay to come see the Provider at the moment and she needs like a 30 day supply until husband can come up with the co-pay .

## 2021-12-24 NOTE — Telephone Encounter (Signed)
Attempted to call the number that is on file and states not in service. The number on patient's DPR is different than the number listed.  Called the husband's number since that appears to be the same on DPR and on file. Unable to LVM   **If pt calls back please advise I can send a 30 day supply refill to the [facility] she has been getting the medication filled. The patient is on free drug so should be able to get the medication until coming in for visit. She does need f/u visit asap though. Please confirm if phone number on file is correct.

## 2021-12-24 NOTE — Addendum Note (Signed)
Addended by: Judi Cong on: 12/24/2021 04:59 PM   Modules accepted: Orders

## 2021-12-25 ENCOUNTER — Other Ambulatory Visit: Payer: Self-pay | Admitting: Neurology

## 2021-12-25 DIAGNOSIS — G35 Multiple sclerosis: Secondary | ICD-10-CM

## 2021-12-25 MED ORDER — VUMERITY 231 MG PO CPDR: 2.0000 | DELAYED_RELEASE_CAPSULE | Freq: Two times a day (BID) | ORAL | 0 refills | Status: DC

## 2022-01-10 ENCOUNTER — Other Ambulatory Visit: Payer: Self-pay | Admitting: Neurology

## 2022-01-10 DIAGNOSIS — G35 Multiple sclerosis: Secondary | ICD-10-CM

## 2022-05-22 ENCOUNTER — Emergency Department (HOSPITAL_COMMUNITY)
Admission: EM | Admit: 2022-05-22 | Discharge: 2022-05-22 | Disposition: A | Discharge status: Home / Self Care | Payer: Self-pay | Attending: Emergency Medicine | Admitting: Emergency Medicine

## 2022-05-22 DIAGNOSIS — M79672 Pain in left foot: Secondary | ICD-10-CM | POA: Insufficient documentation

## 2022-05-22 MED ORDER — NAPROXEN 500 MG PO TABS: 500.0000 mg | ORAL_TABLET | Freq: Two times a day (BID) | ORAL | 0 refills | Status: DC

## 2022-05-22 NOTE — ED Provider Notes (Signed)
[facility] EMERGENCY DEPARTMENT Provider Note   CSN: [id number] Arrival date & time: 05/22/22  1243     History  Chief Complaint  Patient presents with   Foot Pain    Kelly Benson is a 27 y.o. female with a past medical history significant for MS, gait disturbance, and vitamin D deficiency who presents to the ED due to left foot pain x 3 days.  Patient states pain is worse when first stepping in the morning.  Pain located on plantar surface. Denies injury.  No edema.  Admits to a warm sensation and numbness/tingling.  Patient notes she does not believe this is an MS flare because she does not typically have pain with her MS flares.  She admits to bilateral lower extremity weakness due to her MS which has not changed over the past 3 days.  She is currently following neurology here in town.  History obtained from patient and past medical records. No interpreter used during encounter.       Home Medications Prior to Admission medications   Medication Sig Start Date End Date Taking? Authorizing Provider  naproxen (NAPROSYN) 500 MG tablet Take 1 tablet (500 mg total) by mouth 2 (two) times daily. 05/22/22  Yes Calissa Swenor C, PA-C  VITAMIN E PO Take 1 Dose by mouth daily.    [provider]  VUMERITY 231 MG CPDR TAKE 2 CAPSULES BY MOUTH TWICE DAILY IN THE MORNING AND AT BEDTIME. 01/10/22   Sater, Nanine Means, MD      Allergies    Patient has no known allergies.    Review of Systems   Review of Systems  Musculoskeletal:  Positive for arthralgias.  Neurological:  Positive for numbness.  All other systems reviewed and are negative.   Physical Exam Updated Vital Signs BP 130/82 (BP Location: Right Arm)   Pulse 75   Temp 98.4 F (36.9 C) (Oral)   Resp 16   SpO2 100%  Physical Exam Vitals and nursing note reviewed.  Constitutional:      General: She is not in acute distress.    Appearance: She is not ill-appearing.  HENT:     Head:  Normocephalic.  Eyes:     Pupils: Pupils are equal, round, and reactive to light.  Cardiovascular:     Rate and Rhythm: Normal rate and regular rhythm.     Pulses: Normal pulses.     Heart sounds: Normal heart sounds. No murmur heard.    No friction rub. No gallop.  Pulmonary:     Effort: Pulmonary effort is normal.     Breath sounds: Normal breath sounds.  Abdominal:     General: Abdomen is flat. There is no distension.     Palpations: Abdomen is soft.     Tenderness: There is no abdominal tenderness. There is no guarding or rebound.  Musculoskeletal:        General: Normal range of motion.     Cervical back: Neck supple.     Comments: Full ROM of left ankle. Pedal pulses palpable. No edema. Mild tenderness on plantar surface. No deformity.   Skin:    General: Skin is warm and dry.  Neurological:     General: No focal deficit present.     Mental Status: She is alert.  Psychiatric:        Mood and Affect: Mood normal.        Behavior: Behavior normal.     ED Results / Procedures /  Treatments   Labs (all labs ordered are listed, but only abnormal results are displayed) Labs Reviewed - No data to display  EKG None  Radiology No results found.  Procedures Procedures    Medications Ordered in ED Medications - No data to display  ED Course/ Medical Decision Making/ A&P                           Medical Decision Making Risk Prescription drug management.   27 year old female presents to the ED due to left foot pain x 3 days.  History of MS.  Patient states pain is located on the plantar surface of left foot.  Pain worse when first stepping in the morning.  No injury.  Upon arrival, vitals all within normal limits.  Patient in no acute distress.  Benign physical exam.  Left lower extremity neurovascularly intact with soft compartments.  Low suspicion for compartment syndrome.  No bony tenderness to suggest fracture.  No evidence of infection.  Equal strength  bilaterally.  I had a long discussion with patient in regards to possible MS flare versus plantar fasciitis versus other etiology.  Offered for further workup to rule out MS flare however patient states she prefers to follow-up with her neurologist this week for further evaluation and prefers symptomatic treatment with podiatry referral. Patient notes her MS flares never typically occur with pain.  Patient declined x-ray. Patient discharged with naproxen and referral to podiatry.  Patient denies any concerns about possible pregnancy and advised patient not to take medication if she could be pregnant. Strict ED precautions discussed with patient. Patient states understanding and agrees to plan. Patient discharged home in no acute distress and stable vitals         Final Clinical Impression(s) / ED Diagnoses Final diagnoses:  Left foot pain    Rx / DC Orders ED Discharge Orders          Ordered    naproxen (NAPROSYN) 500 MG tablet  2 times daily        05/22/22 1416              Suzy Bouchard, [place] 05/27/22 1210    Tegeler, Gwenyth Allegra, MD 05/27/22 1520

## 2022-05-22 NOTE — ED Triage Notes (Signed)
Patient here for evaluation of pain on the plantar aspect of her left foot started a few days ago, worse in the mornings after waking and when she stands after sitting for long periods of time.

## 2022-05-22 NOTE — Discharge Instructions (Addendum)
It was a pleasure taking care of you today.  As discussed, I am sending you home with pain medication.  Take twice a day for the next few days.  I have included the number of the podiatrist.  Call if symptoms do not improve over the next week.  Please follow-up with your neurologist this week.  Return to the ER for new or worsening symptoms.

## 2022-05-28 ENCOUNTER — Ambulatory Visit: Payer: Self-pay | Admitting: Neurology

## 2022-08-28 ENCOUNTER — Telehealth: Payer: Self-pay

## 2022-08-28 NOTE — Telephone Encounter (Signed)
Told pharmacy that pt hasn't been seen for a year with [place] and that pt needs to call our [phone] to make an Appointment and then she can get her refill request.

## 2022-09-03 ENCOUNTER — Telehealth: Payer: Self-pay | Admitting: Neurology

## 2022-09-03 NOTE — Telephone Encounter (Signed)
I just sent a message to you regarding this patient and she just paid her balance here for $200 affected her getting her meds and they put a hold. She just asked if we could let [employer] know. I really am not sure if that is something that you can do or the patient. I advised her to try to reach out to them but will add to her telephone note.

## 2022-09-03 NOTE — Telephone Encounter (Signed)
Left message for patient to call.

## 2022-09-03 NOTE — Telephone Encounter (Signed)
I called and spoke with patient reports her vision is blurry without her glasses, before she did not have blurry vision when she removed glasses. She saw eye MD in Oct 2023, when standing she feel dizzy when getting you to quick or if she moves her head left to right to fast. No recent falls, has some leg weakness. No recent UTI, last UTI was 1 month ago. Pt offered visit with Dr.Sater on 09/17/22 @ 3:30pm, she was thrilled to be seen this day and was told to arrive 30 minutes to check in. I told her once she is seen when can follow up regarding her vumerity. Pt verbalized she understood.

## 2022-09-03 NOTE — Telephone Encounter (Signed)
Patient wants to make an appt she cancelled in January because she didn't have the funds to pay her $200 balance. She came in to pay and I am unable to find an appointment. he has been out of her medication and isn't doing well. Vision changed a lot and movement turning to look at something makes her really dizzy.

## 2022-09-17 ENCOUNTER — Telehealth: Payer: Self-pay | Admitting: *Deleted

## 2022-09-17 ENCOUNTER — Encounter: Payer: Self-pay | Admitting: Neurology

## 2022-09-17 ENCOUNTER — Ambulatory Visit (INDEPENDENT_AMBULATORY_CARE_PROVIDER_SITE_OTHER): Payer: Self-pay | Admitting: Neurology

## 2022-09-17 VITALS — BP 137/95 | HR 104 | Ht 64.0 in | Wt 170.8 lb

## 2022-09-17 DIAGNOSIS — Z79899 Other long term (current) drug therapy: Secondary | ICD-10-CM

## 2022-09-17 DIAGNOSIS — R269 Unspecified abnormalities of gait and mobility: Secondary | ICD-10-CM

## 2022-09-17 DIAGNOSIS — R200 Anesthesia of skin: Secondary | ICD-10-CM

## 2022-09-17 DIAGNOSIS — G35 Multiple sclerosis: Secondary | ICD-10-CM

## 2022-09-17 DIAGNOSIS — R4781 Slurred speech: Secondary | ICD-10-CM

## 2022-09-17 MED ORDER — PREDNISONE 50 MG PO TABS: ORAL_TABLET | ORAL | 0 refills | Status: DC

## 2022-09-17 NOTE — Progress Notes (Signed)
[facility]  PATIENT: Kelly Benson DOB: [birth year]  REFERRING DOCTOR OR PCP: None SOURCE: Patient, emergency room and hospital admission and discharge notes, imaging and laboratory reports, MRI images personally reviewed.  _________________________________   HISTORICAL  CHIEF COMPLAINT:  Chief Complaint  Patient presents with   Follow-up    RM 11. Last seen 03/12/21. MS DMT: Vumerity.  Has been out of Vumerity for three months. Pt reports left eye pain, twitching all over (eye and mouth more severe), facial weakness/mouth slanted, drool more at night, tongue numb, vision changes (blurry vision, L eye-double vision), dizziness, fatigue, ears pop (more in L ear), shaky, coordination problems, SOB, infections (gets infections more easily). Has had one fall in last yr. This occurred a couple days ago. No injuries.   Blurred Vision    Wears glasses. Last eye appt 10/204. Dr. Earna Coder Nobles/ [facility].     Speech Problem    Has noticed trouble speaking starting a week ago.  Has more trouble with L sounds and D sounds. Trouble formulating what she is going to say.  Last MRI 2020.    Insurance     Pt does not have insurance currently. Would have to pay out of pocket for things. She is filing for disability through a Lawyer currently.    HISTORY OF PRESENT ILLNESS:  Kelly Benson is a 27 y.o. 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   Update 09/17/2022: She has had left eye pain x 5-6 days and reduced vision on the left  She also notes twitching in her eye and left side of the mouth.    She feels off balanced and dizzy and speech is different.  When she stands up she feels lie she is going to fall backwards.    Symptoms seem worse after she is awake longer.  She is not on any medication right now.    She was on Vumerity (last took about 3 months ago).  She felt her MS was stable on it though she had some SOB when she took it and  missed some doses due to bid dosing.   She also had occasional itching.  We had discussed a different medication   In the past, she had another exacerbation while off of her disease modifying therapy.    Her last MRI's were in 2020 (April and October and April)  and she had enhancing lesions in both the brain and cervical spine.  I saw her after these MRIs and initiated Vumerity.  She has never been on another DMT.  She does not have insurance.  We discussed trying to get an MRI covered by the MSAA.  Gait has worsened compared to her last visit.  She cannot go fall without support.  She had had a Lhermitte sign in the past and is experiencing 1 again.  She has urinary urgency and occasional urge incontinence.   Bowel is doing well.    She is always fatigued, especially if more active.   She has noted mild problems with cognition.  She sleeps well.  She has had some depression.    MS History She  was diagnosed with MS in April 2017.   In 2017, the speech was more slurred and she felt off balanced.  She went to the ED and MRI was consistent with MS.   She did not follow up with a neurologist.   She did well until 2 weeks ago.  She had a numbing sensation in her left middle finger that then increased to the 4th and 5th finger.  Over the next 2 days, numbness developed in the left body from the hand on down.   Looking down, she has a Lhermitte sign.    She went to the ED on 02/24/19, a few days after symptoms appeared.    She received 5 days of IV Solumedrol and she did PT in the hospital and occupational therapy.   She notes left sided weakness and clumsiness.    She has severe numbness in the 3rd-5th fingers on the left and moderate numbness in the left  flank and perineum.   Bladder function is worse with urge incontinence at times.    She notes that her vision is worse OD.      IMAGING MRIs of the brain from 02/25/2019 and compared them to the MRI from 10/14/2018.  MRI of the brain shows multiple  T2/flair hyperintense foci in the hemispheres and also a focus in the right cerebral peduncle and 1 in the left thalamus.  There are several T2 hyperintense foci in the spinal cord.   I also reviewed her first MRI from 09/02/2015.  There are fewer foci on that MRI compared to the 2020 studies but some of the foci were enhancing at that time.Marland Kitchen  MRI of the cervical spine 02/25/2019 showed an enhancing lesion to the left adjacent to C2 and another lesion posteriorly to the right at that level.  There is a posterior lesion, a little more to the left, a little more to the left at C4-C5 with subtle enhancement.  There is another focus centrally and anteriorly adjacent to C6-C7.  The focus to the left at C2 was not present on the previous MRI from June 2020.    REVIEW OF SYSTEMS: Constitutional: No fevers, chills, sweats, or change in appetite Eyes: No visual changes, double vision, eye pain Ear, nose and throat: No hearing loss, ear pain, nasal congestion, sore throat Cardiovascular: No chest pain, palpitations Respiratory:  No shortness of breath at rest or with exertion.   No wheezes GastrointestinaI: No nausea, vomiting, diarrhea, abdominal pain, fecal incontinence Genitourinary:  No dysuria, urinary retention or frequency.  No nocturia. Musculoskeletal:  No neck pain, back pain Integumentary: No rash, pruritus, skin lesions Neurological: as above Psychiatric: No depression at this time.  No anxiety Endocrine: No palpitations, diaphoresis, change in appetite, change in weigh or increased thirst Hematologic/Lymphatic:  No anemia, purpura, petechiae. Allergic/Immunologic: No itchy/runny eyes, nasal congestion, recent allergic reactions, rashes  ALLERGIES: No Known Allergies  HOME MEDICATIONS:  Current Outpatient Medications:    predniSONE (DELTASONE) 50 MG tablet, 20 pills (1000 mg) po qd x 3days for MS exacerbation, Disp: 60 tablet, Rfl: 0   VITAMIN D PO, Take 1 Dose by mouth daily., Disp: ,  Rfl:    VITAMIN E PO, Take 1 Dose by mouth daily., Disp: , Rfl:    VUMERITY 231 MG CPDR, TAKE 2 CAPSULES BY MOUTH TWICE DAILY IN THE MORNING AND AT BEDTIME. (Patient not taking: Reported on 09/17/2022), Disp: 120 capsule, Rfl: 5  PAST MEDICAL HISTORY: Past Medical History:  Diagnosis Date   MS (multiple sclerosis) (HCC)    Plantar fasciitis    Scoliosis     PAST SURGICAL HISTORY: Past Surgical History:  Procedure Laterality Date   NO PAST SURGERIES      FAMILY HISTORY: Family History  Problem Relation Age of Onset   Hypertension Father  Multiple sclerosis Neg Hx     SOCIAL HISTORY:  Social History   Socioeconomic History   Marital status: Married    Spouse name: Not on file   Number of children: 0   Years of education: College 15 years   Highest education level: Not on file  Occupational History   Occupation: [employer] engineer  Tobacco Use   Smoking status: Never   Smokeless tobacco: Never  Substance and Sexual Activity   Alcohol use: Yes    Comment: rare   Drug use: No   Sexual activity: Yes    Birth control/protection: None  Other Topics Concern   Not on file  Social History Narrative    Right handed    Tea/soda daily   Lives with husband   Social Determinants of Health   Financial Resource Strain: Not on file  Food Insecurity: Not on file  Transportation Needs: Not on file  Physical Activity: Not on file  Stress: Not on file  Social Connections: Not on file  Intimate Partner Violence: Not on file     PHYSICAL EXAM  Vitals:   09/17/22 1519  BP: (!) 137/95  Pulse: (!) 104  Weight: 170 lb 12.8 oz (77.5 kg)  Height: 5\' 4"  (1.626 m)    Body mass index is 29.32 kg/m.    General: The patient is well-developed and well-nourished and in no acute distress  Skin: Extremities are without rash or  edema.  Neurologic Exam  Mental status: The patient is alert and oriented x 3 at the time of the examination. The patient has apparent normal recent  and remote memory, with an apparently normal attention span and concentration ability.   Speech is normal.  Cranial nerves: Extraocular movements are full.  She has a mild left APD.  There is reduced color saturation and acuity OS vs OD.  .  Facial symmetry is present.  Facial strength is normal..  Trapezius and sternocleidomastoid strength is normal. No dysarthria is noted.  Hearing is normal and symmetric.  Motor:  Muscle bulk is normal.   Tone is normal. Strength is symmetric 5- to 5 in all  Sensory: She has slight reduced sensation in the left fourth and fifth fingers  Coordination: Cerebellar testing reveals reduced finger-nose-finger, little worse on right and reduced heel-to-shin.  Gait and station: Station is normal.  Gait is mildly wide and she cannot tandem walk Romberg is positive.   Reflexes: Deep tendon reflexes are symmetric and normal in arms and increased in legs.   Marland Kitchen      DIAGNOSTIC DATA (LABS, IMAGING, TESTING) - I reviewed patient records, labs, notes, testing and imaging myself where available.  Lab Results  Component Value Date   WBC 6.4 10/28/2020   HGB 12.5 10/28/2020   HCT 38.4 10/28/2020   MCV 84.2 10/28/2020   PLT 285 10/28/2020       ASSESSMENT AND PLAN  Multiple sclerosis exacerbation (HCC) - Plan: CBC with Differential/Platelet  High risk medication use - Plan: CBC with Differential/Platelet  Gait disturbance  Numbness  Slurred speech   1.   She is having an MS exacerbation.  I will have her do 3 days of high-dose oral prednisone and we will try to get her back on Vumerity.  She will call us back on Monday if she is not any better I would consider an additional couple of days  2.  Stay active and exercise as tolerated. 3.  She has not had an MRI in several  years as insurance has been off and on for her.  I will see if we can get 1 covered by the MSAA.   Return in 4-6 months or sooner if she enters a study or for new or worsening neurologic  symptoms.  Nathalia Wismer A. Epimenio Foot, MD, [facility] 09/17/2022, 9:01 PM Certified in Neurology, Clinical Neurophysiology, Sleep Medicine and Neuroimaging  [facility] [address], [address] [place], [place] [address] [phone]

## 2022-09-17 NOTE — Telephone Encounter (Signed)
Faxed completed/signed Vumerity start form to [employer] at [phone]. Received fax confirmation.  

## 2022-09-17 NOTE — Patient Instructions (Signed)
She has new symptoms c/w MS exacerbation.  I will write for high dose steroids and have her restart a treatment

## 2022-09-17 NOTE — Progress Notes (Signed)
Faxed completed/signed application to MRI access program/MSAA for pt to complete MRI brain/cervical. Fax: [phone]. Received fax confirmation. Also gave pt copy of her portion of application.

## 2022-09-18 LAB — CBC WITH DIFFERENTIAL/PLATELET
Basophils Absolute: 0.1 10*3/uL (ref 0.0–0.2)
Basos: 1 %
EOS (ABSOLUTE): 0.1 10*3/uL (ref 0.0–0.4)
Eos: 2 %
Hematocrit: 40.2 % (ref 34.0–46.6)
Hemoglobin: 13.4 g/dL (ref 11.1–15.9)
Immature Grans (Abs): 0 10*3/uL (ref 0.0–0.1)
Immature Granulocytes: 0 %
Lymphocytes Absolute: 1.9 10*3/uL (ref 0.7–3.1)
Lymphs: 25 %
MCH: 27.3 pg (ref 26.6–33.0)
MCHC: 33.3 g/dL (ref 31.5–35.7)
MCV: 82 fL (ref 79–97)
Monocytes Absolute: 0.8 10*3/uL (ref 0.1–0.9)
Monocytes: 11 %
Neutrophils Absolute: 4.7 10*3/uL (ref 1.4–7.0)
Neutrophils: 61 %
Platelets: 289 10*3/uL (ref 150–450)
RBC: 4.9 x10E6/uL (ref 3.77–5.28)
RDW: 13.9 % (ref 11.7–15.4)
WBC: 7.6 10*3/uL (ref 3.4–10.8)

## 2022-09-18 NOTE — Telephone Encounter (Signed)
[facility] emailed back and confirmed pt enrolled in free drug program yesterday and they will forward start form to [employer] that includes prescription for them to ship medication. Nothing further needed from our office at this time.

## 2022-09-26 ENCOUNTER — Encounter: Payer: Self-pay | Admitting: Neurology

## 2022-10-01 ENCOUNTER — Encounter: Payer: Self-pay | Admitting: *Deleted

## 2022-10-03 ENCOUNTER — Other Ambulatory Visit: Payer: Self-pay | Admitting: Neurology

## 2022-10-03 DIAGNOSIS — G35 Multiple sclerosis: Secondary | ICD-10-CM

## 2022-11-13 ENCOUNTER — Encounter: Payer: Self-pay | Admitting: Neurology

## 2022-11-19 ENCOUNTER — Telehealth: Payer: Self-pay | Admitting: *Deleted

## 2022-11-19 NOTE — Telephone Encounter (Signed)
Please call pt to make follow up appt.  He wanted to see her 4 mon from 09-17-2022.  Next available please.

## 2022-11-25 ENCOUNTER — Encounter: Payer: Self-pay | Admitting: *Deleted

## 2022-12-02 DIAGNOSIS — Z0271 Encounter for disability determination: Secondary | ICD-10-CM

## 2022-12-04 ENCOUNTER — Telehealth: Payer: Self-pay | Admitting: Neurology

## 2022-12-04 NOTE — Telephone Encounter (Signed)
LVM and sent mychart msg informing pt of need to reschedule 01/23/23 appt - MD out

## 2023-01-23 ENCOUNTER — Ambulatory Visit: Payer: Self-pay | Admitting: Neurology

## 2023-01-24 IMAGING — DX DG CHEST 2V
2 series · 2 of 2 positions shown · non-contrast
Comparison: 10/14/2018

CLINICAL DATA: Chest pain and shortness of breath

EXAM:
CHEST - 2 VIEW

[chest pa]
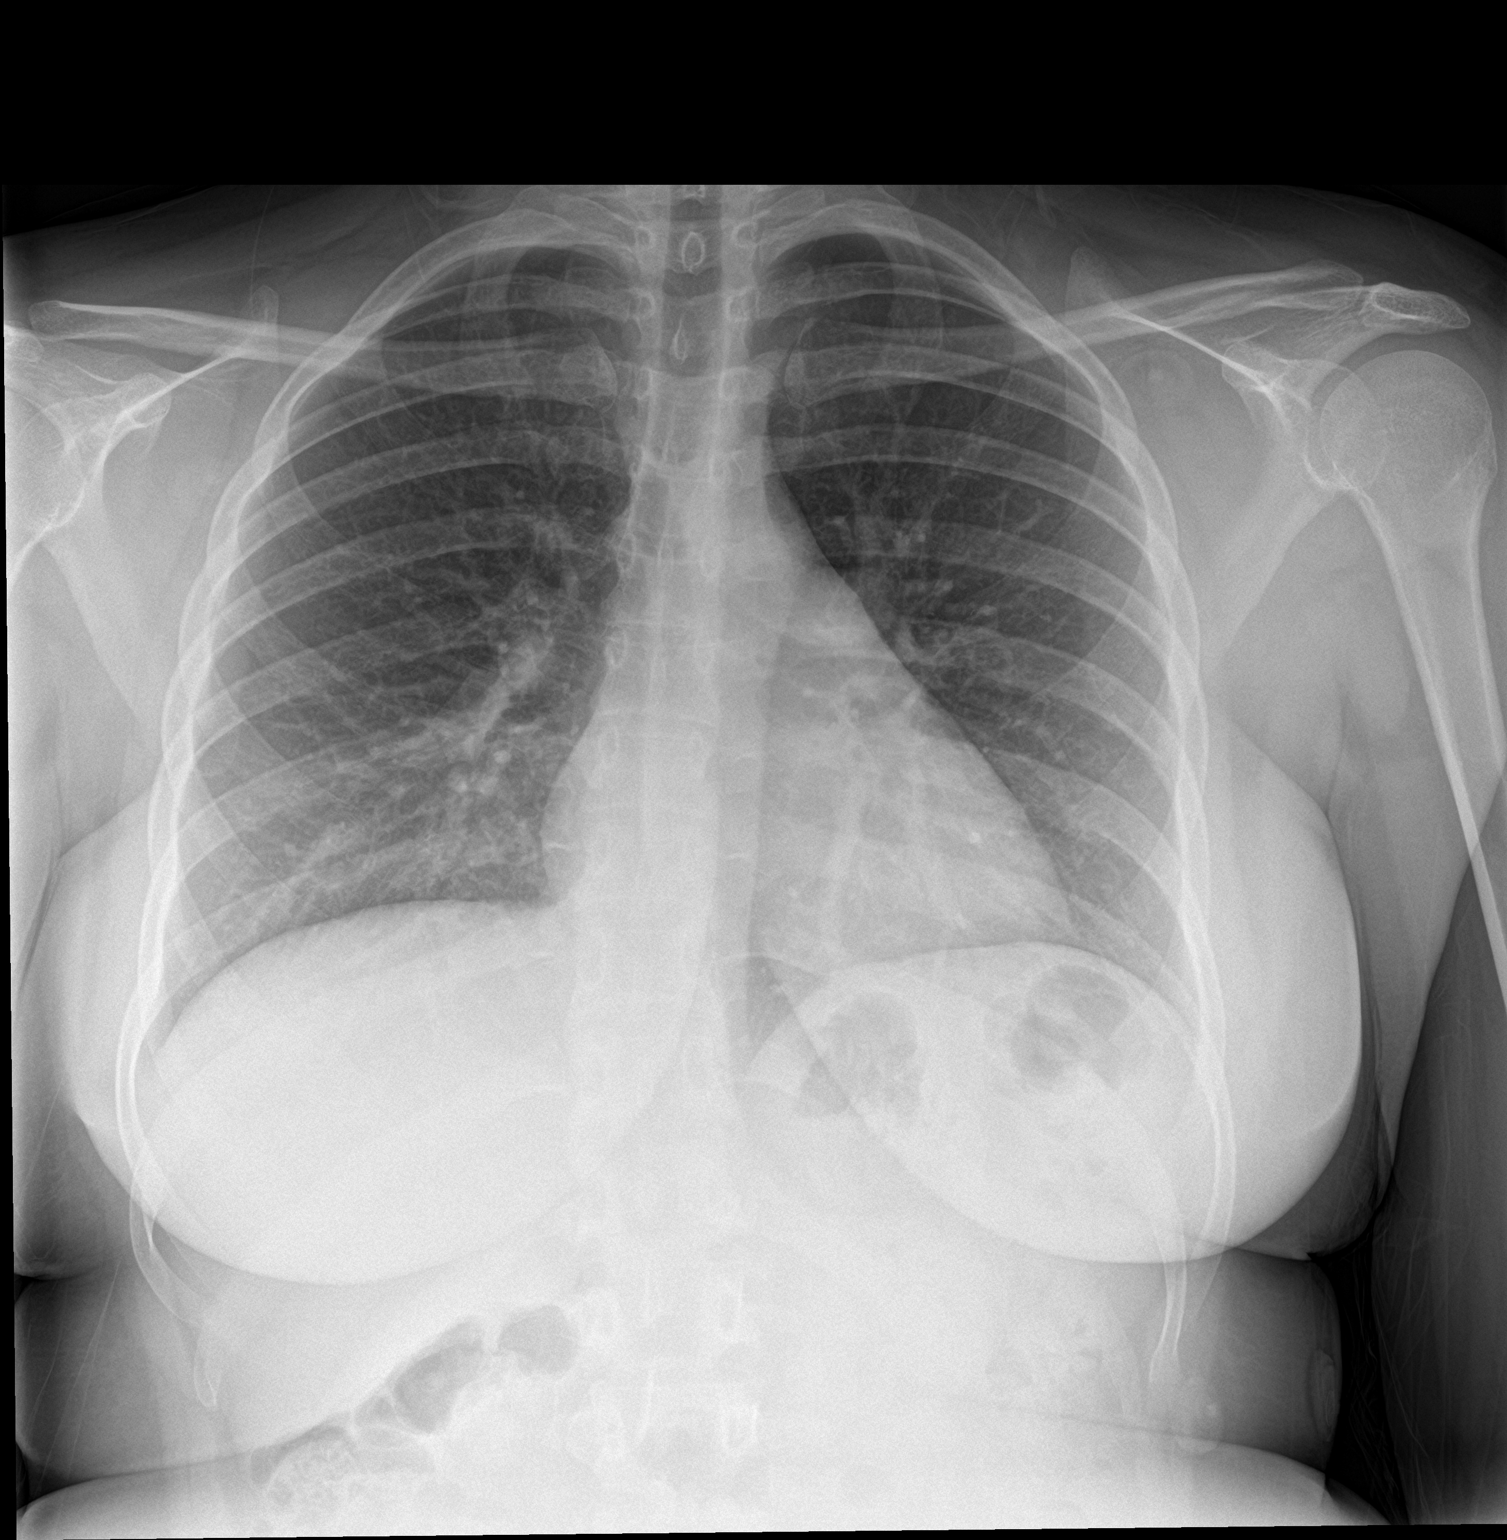

[chest lat]
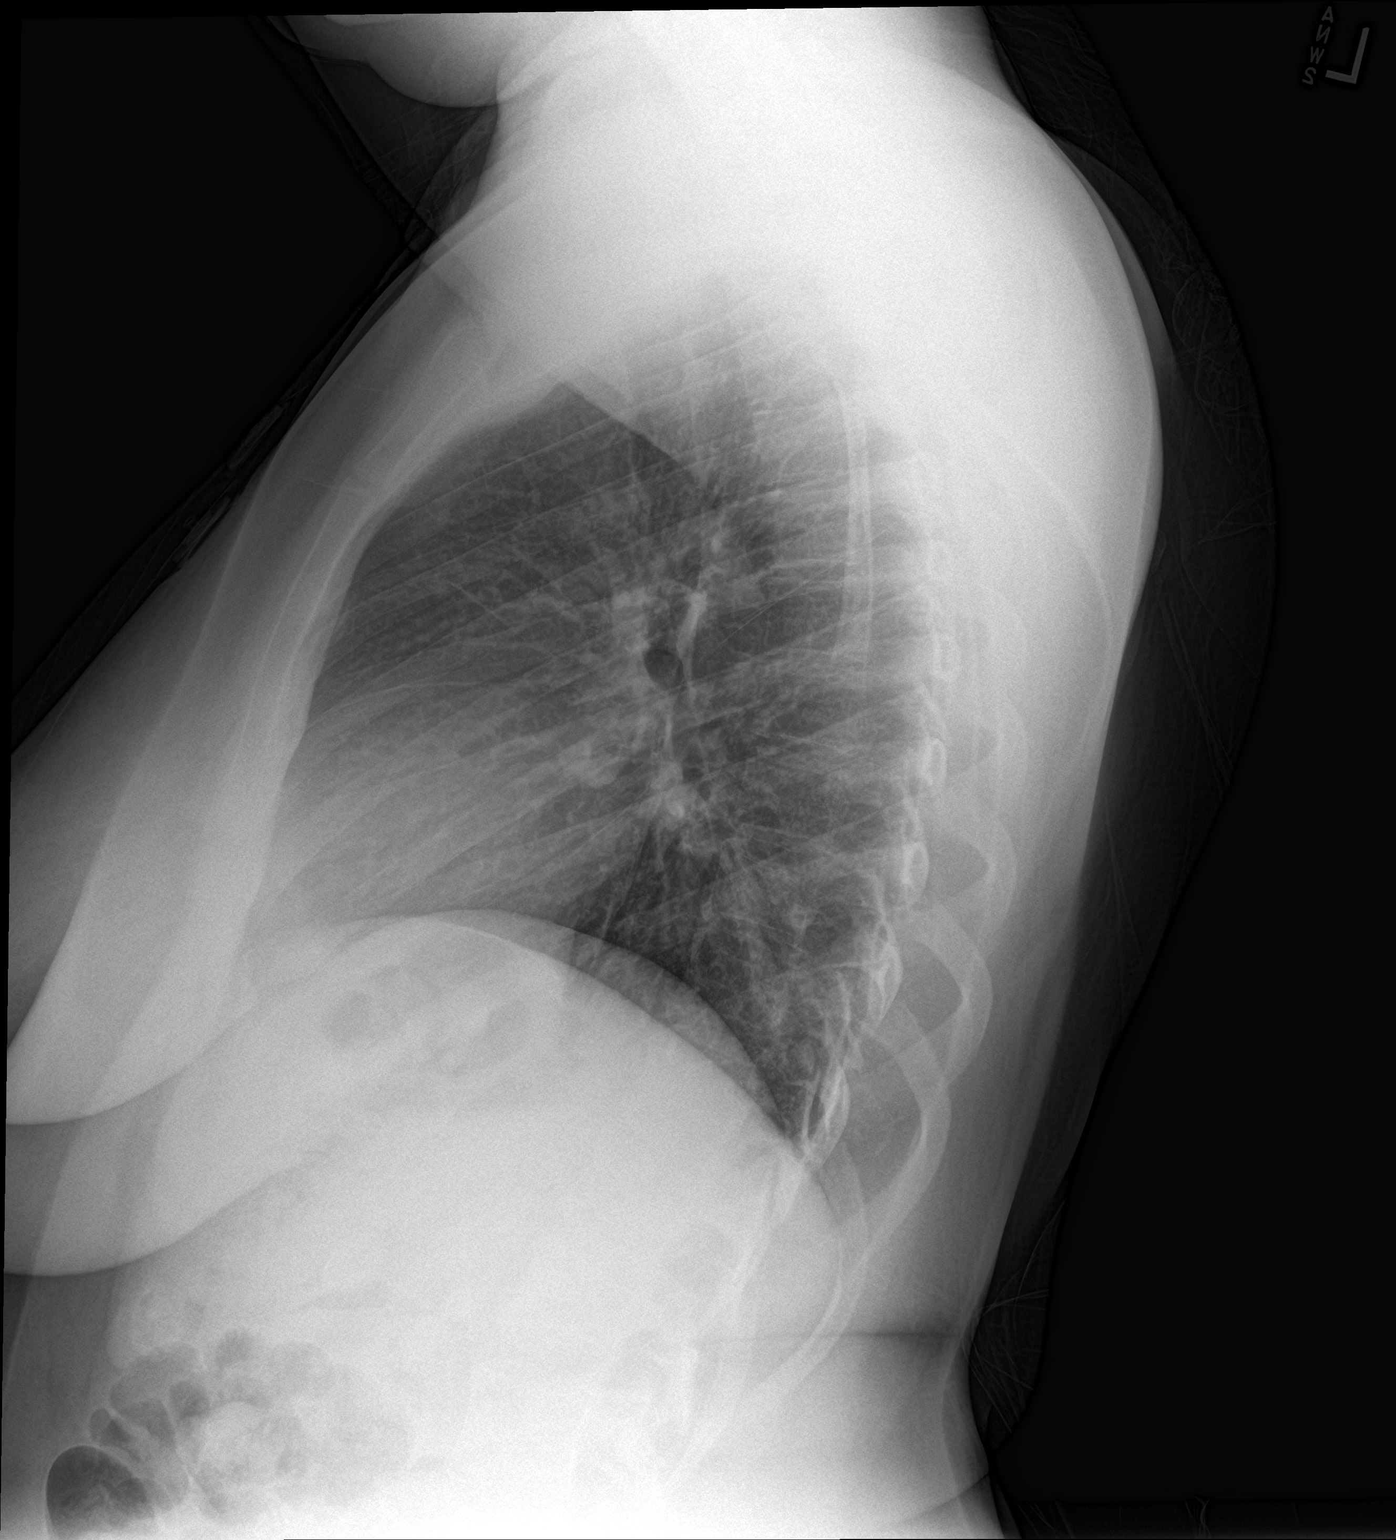

[2 of 2 positions shown; findings below may reference images not displayed]

FINDINGS: The heart size and mediastinal contours are within normal limits.
Both lungs are clear. The visualized skeletal structures are
unremarkable.
IMPRESSION: No active cardiopulmonary disease.

## 2023-07-05 ENCOUNTER — Ambulatory Visit (HOSPITAL_COMMUNITY)
Admission: EM | Admit: 2023-07-05 | Discharge: 2023-07-05 | Disposition: A | Discharge status: Home / Self Care | Payer: Self-pay

## 2023-07-05 DIAGNOSIS — F4321 Adjustment disorder with depressed mood: Secondary | ICD-10-CM

## 2023-07-05 NOTE — Discharge Instructions (Addendum)
 Discharge recommendations:   Outpatient Follow up: Please review list of outpatient resources for psychiatry and counseling. Please follow up with your primary care provider for all medical related needs.   Therapy: We recommend that patient participate in individual therapy to address mental health concerns.  Safety:   The following safety precautions should be taken:   No sharp objects. This includes scissors, razors, scrapers, and putty knives.   Chemicals should be removed and locked up.   Medications should be removed and locked up.   Weapons should be removed and locked up. This includes firearms, knives and instruments that can be used to cause injury.   The patient should abstain from use of illicit substances/drugs and abuse of any medications.  If symptoms worsen or do not continue to improve or if the patient becomes actively suicidal or homicidal then it is recommended that the patient return to the closest hospital emergency department, the [facility], or call 911 for further evaluation and treatment. National Suicide Prevention Lifeline 1-800-SUICIDE or 978-025-7979.  About 988 988 offers 24/7 access to trained crisis counselors who can help people experiencing mental health-related distress. People can call or text 988 or chat 988lifeline.org for themselves or if they are worried about a loved one who may need crisis support.    Base on the information you have provided and the presenting issue, outpatient services and resources for have been recommended.  It is imperative that you follow through with treatment recommendations within 5-7 days from the of discharge to mitigate further risk to your safety and mental well-being. A list of referrals has been provided below to get you started.  You are not limited to the list provided.  In case of an urgent crisis, you may contact the Mobile Crisis Unit with [facility] at  1.407 771 6236.  [facility] [address] [place], [place] [address]  [phone]  Are you feeling "some kind of way", unsettled in yourself, or having a difficult time identifying how to navigate through your world. You are not alone. We all encounter times when the life we are living becomes overwhelming and we wish for someone to hear the things we are feeling inside but are unable to express. We are looking to understand why these things are happening to [place], and how to feel at peace with ourselves. We want to know that these negative feelings will not always be with [place]. No, I do not have a magic wand that will solve all your worries.What I offer is the opportunity to listen to your story and help you develop  As a graduate of [employer], with a Masters of arts in counseling psychology, I serve a diverse population while personalizing care. The skills used come from art therapy, play therapy, CBT(Cognitive Behavioral Therapy), EMDR, and other specialized therapeutic interventions. I cater to my client's needs while using best practices. I started my counseling practice with the desire of "helping others heal" from the conflict they have faced in life and their personal struggles. I have had the honor of assisting people overcome many of their life struggles and to move forward and enjoy the life that they wish to have. Give me a call and let me know how I can help you.   [facility]  [address], [place], [place] [address]p:[phone] fax:[phone]  Our Practice  Psychotherapy, which is also often referred to as talk therapy, is a mental health treatment  that addresses the difficulties one encounters as the result of mental illness, emotional trauma, adjustment difficulties, and a wide variety of mental health challenges. Psychotherapy is a treatment that is intended to assist a person in controlling and eliminating  negative symptoms that have a problematic impact on one's daily functioning and thought processes. The desired outcome is improved functioning, increased well-being, and emotional healing.    Our Treatment Focus Our focus is to help individuals heal, energize, and become aware of their inner strengths. We achieve this by providing a neutral safe space, listening to your concerns, and customizing a treatment plan.   Our Patient Promise We promise to be there for you every step of your journey. Our goal is to help you grow from your struggles, heal from your pain, and move forward to where you want to be in your life.       Frederic Jericho, MSW, [place], [facility] [phone] [address] [place], [place] [address] Office [phone] Fax [phone]  I am pleased that you have selected me as your therapist.  I have a Child psychotherapist of Social Work degree, obtained at the [employer] [place] [employer] in 2000.  I am licensed with the [facility] Social Work Public librarian [phone]).   The services offered to you include individual, family, and group counseling.  My therapeutic approach  is derived from my training and experience in Cognitive-Behavioral Therapy, Solution Focused Therapy, Reality Therapy, as well as, interpersonal and developmental theories of counseling.  I have special interests in anxiety, depression, sexual and physical abuse, play therapy, child and adolescent development, and family systems.  I am specifically trained in Trauma Focused-Cognitive Behavioral Therapy (TF-CBT).  I will not discriminate because of age, race, gender, sexual orientation or religion.  If there is anything I should know concerning your culture or religion, I ask that you please inform me so that I can better understand you.  If for any reason, I determine that my knowledge and expertise are not sufficient for your particular needs, I will make every effort to refer you to another  counselor who is prepared to work more effectively with you.  Are you or a loved one dealing with the aftermath of a traumatic experience? Do life transitions leave you grappling with feelings of depression or anxiety? Are you concerned about your child's negative behaviors, sleep disturbances, or difficulties during a divorce? It's important to understand that children primarily operate based on emotions, while adults tend to use rational thinking. This emotional focus can impact a child's ability to make effective decisions and express their feelings.   Services Individual Counseling  Individual counseling is a personal opportunity to receive support and experience growth during challenging times in life. Individual counseling can help one deal with many personal topics in life such as anger, depression, anxiety, substance abuse, marriage and relationship challenges, parenting problems, school difficulties, career changes, etc.  Family Counseling  Families often are faced with issues and challenges that greatly impact all the members. The goal of family therapy is to renew and maintain the natural family structure and support group with an emphasis on returning the individual to a healthy family and community life. Family counseling can help improve communication, resolve conflict, and improve connections. [/one-half-first]  Parenting Coordinator Parent Coordinator is a neutral third party acting in the children's best interest attempting to reach a fair compromise of the issues at hand. Assisting parents manage their parenting plan, improve communication,  and resolve disputes. A court appointment neutral party.  Treatment specialization includes: Depression  Anxiety  Attention Deficit/Hyperactive Disorder  Grief Counseling  Conflict Resolution  Emotional Regulation  Family Counseling  Divorce/Separation  Mediation  Conflict Resolution  Relationship Building  Parenting  Coordinator  Parental Support  Please contact one of the following facilities to start medication management and therapy services:   [facility] [address] [place] [address]  [place], [place] [address] [phone]   [facility]  [address] [place], [place] [address] [phone]  [facility] [place]  [address]ella R[address] [place], [place] [address] [phone]  [facility]  [address]  [place], [place] [address] [phone]  [facility]  [address] [place], [place] [address] [phone]  [facility]  [address] Renee Rival  [place], [place] [address] [phone]  [facility] Address: [address] [place], [place], [place] [address] Phone: [phone] Services: Specialize in outpatient and substance abuse therapy for couples, family and children  [facility] Address: [address], [place], [place] [address] Phone: [phone]  [facility] Address: [address], [place], [place] [address] Phone: [phone]
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
## 2023-07-05 NOTE — ED Provider Notes (Signed)
 Behavioral Health Urgent Care Medical Screening Exam  Patient Name: Kelly Benson MRN: [id number] Date of Evaluation: 07/05/23 Chief Complaint:  passive suicidal ideation with no plan or intent. Diagnosis:  Final diagnoses:  Adjustment disorder with depressed mood    History of Present illness: Kelly Benson is a 28 y.o. female patient with no past psychiatric history and a medical history of multiple sclerosis who presented to the [facility] behavioral health urgent care voluntarily accompanied by law enforcement with complaints of passive suicidal ideation with no plan or intent.  Patient states that she called the police because she had a thought of "what if I hurt myself" for the first time. She describes it as a brief thought and states that she does not want to kill herself. She states that she had a dream last night that she hurt herself and when she woke up this morning she questioned her husband about his cheating and further states that she has been having a hard time forgiving him for cheating. She states that her husband cheated on her a year ago and cheated again. She states that she left him in January 2025 and they got back together the beginning of this month. She reports having anger and guilt towards getting back with her husband because she is unable to forgive him. She states that her husband is willing to do couples therapy to work through their problems. She is also interested in individual counseling. She states that her goal for coming here today is to get resources for counseling.  On evaluation, patient is alert and oriented x 4. Her thought process is linear and future-oriented. Her speech is clear and coherent. Her mood is dysphoric and affect is congruent. Patient denies past or present suicidal ideations other then what appears to be a transient thought of death this morning. Patient denies past suicide attempts. Patient denies past or present self injurious  behaviors. Patient denies homicidal ideations. Patient denies auditory or visual hallucinations. There is no objective evidence that the patient is currently responding to internal or external stimuli, or experiencing delusional or paranoia thought content. Patient endorses depressive symptoms of anger, irritability, and guilt. Patient denies depressive symptoms of feeling hopeless, worthless, anhedonia, low self-esteem, crying spells, or sadness. Patient denies using illicit drugs. Patient states that she started drinking alcohol 2 weeks ago and has only consumed alcohol 3 times in the past 2 weeks. Patient resides with her husband who she has been with for 13 years. Patient denies access to firearms. Patient works full-time on night shift as a Electrical engineer.  Patient gives verbal consent for this provider to speak with her husband Raeonna Milo [phone] and [phone] to obtain collateral information. I attempted to contact Mr. Krass x 5, no answer, left a hippa compliance voicemail. However, no return call from Mr. Bieker.   Patient gives consent to speak with her mother Doretha Sou [phone]. The patient's mother states that she was unaware and that the patient was here at the facility. She states that she spoke to the patient this morning and everything seemed fine and the patient was happy. She confirms that the patient does not have a psychiatric history or past suicide attempts. She states that the patient has a loving family that is supportive. She denies safety concerns with the patient being discharged. Safety planning completed, support encouraged, if patient symptoms worsen or call 911 or take the patient to the closest emergency department or behavioral health  urgent care for psychiatric evaluation.  Patient does not appear to be at imminent risk of dangerousness to self and dangerousness to others at this time. While future psychiatric events cannot be accurately predicted, the  patient does not necessitate nor desire further acute inpatient psychiatric care at this time. This patient presents with risk factors that include passive thoughts of death. These are mitigated by protective factors which include lack of active SI/HI, no history of previous suicide attempts, religion does not believe in suicide, goal oriented-working towards becoming a Neurosurgeon, no history of violence, sense of responsibility to family and social supports (mother), presence of an available support system (aunt who lives in [place]), employment, expresses purpose for living, and effective problem solving skills (receptive to therapy).    Flowsheet Row ED from 07/05/2023 in [facility]  C-SSRS RISK CATEGORY Low Risk       Psychiatric Specialty Exam  Presentation  General Appearance:Appropriate for Environment  Eye Contact:Fair  Speech:Clear and Coherent  Speech Volume:Normal  Handedness:Right   Mood and Affect  Mood: Dysphoric  Affect: Congruent   Thought Process  Thought Processes: Coherent  Descriptions of Associations:Intact  Orientation:Full (Time, Place and Person)  Thought Content:Logical    Hallucinations:None  Ideas of Reference:None  Suicidal Thoughts:Yes, Passive Without Intent; Without Plan; Without Means to Carry Out  Homicidal Thoughts:No   Sensorium  Memory: Immediate Fair; Recent Fair; Remote Fair  Judgment: Fair  Insight: Fair   Chartered certified accountant: Fair  Attention Span: Fair  Recall: [employer] of Knowledge: Fair  Language: Fair   Psychomotor Activity  Psychomotor Activity: Normal   Assets  Assets: Manufacturing systems engineer; Desire for Improvement; Financial Resources/Insurance; Housing; Leisure Time; Physical Health; Resilience; Social Support; Vocational/Educational   Sleep  Sleep: Fair  Number of hours:  5   Physical Exam: Physical Exam ROS Blood  pressure 124/75, pulse 80, temperature 98.8 F (37.1 C), temperature source Oral, resp. rate 18, height 5\' 6"  (1.676 m), weight 189 lb (85.7 kg), SpO2 97%. Body mass index is 30.51 kg/m.  Musculoskeletal: Strength & Muscle Tone: within normal limits Gait & Station: normal Patient leans: N/A   [facility] MSE Discharge Disposition for Follow up and Recommendations: Based on my evaluation the patient does not appear to have an emergency medical condition and can be discharged with resources and follow up care in outpatient services for Individual Therapy and Group Therapy Discharge recommendations:   Outpatient Follow up: Please review list of outpatient resources for psychiatry and counseling. Please follow up with your primary care provider for all medical related needs.   Therapy: We recommend that patient participate in individual therapy to address mental health concerns.  Safety:   The following safety precautions should be taken:   No sharp objects. This includes scissors, razors, scrapers, and putty knives.   Chemicals should be removed and locked up.   Medications should be removed and locked up.   Weapons should be removed and locked up. This includes firearms, knives and instruments that can be used to cause injury.   The patient should abstain from use of illicit substances/drugs and abuse of any medications.  If symptoms worsen or do not continue to improve or if the patient becomes actively suicidal or homicidal then it is recommended that the patient return to the closest hospital emergency department, the [facility], or call 911 for further evaluation and treatment. National Suicide Prevention Lifeline 1-800-SUICIDE or 8032100838.  About 988  988 offers 24/7 access to trained crisis counselors who can help people experiencing mental health-related distress. People can call or text 988 or chat 988lifeline.org for themselves or if they are  worried about a loved one who may need crisis support.    Base on the information you have provided and the presenting issue, outpatient services and resources for have been recommended.  It is imperative that you follow through with treatment recommendations within 5-7 days from the of discharge to mitigate further risk to your safety and mental well-being. A list of referrals has been provided below to get you started.  You are not limited to the list provided.  In case of an urgent crisis, you may contact the Mobile Crisis Unit with [facility] at 1.226 794 7851.  [facility] [address] [place], [place] [address]  [phone]  Are you feeling "some kind of way", unsettled in yourself, or having a difficult time identifying how to navigate through your world. You are not alone. We all encounter times when the life we are living becomes overwhelming and we wish for someone to hear the things we are feeling inside but are unable to express. We are looking to understand why these things are happening to [place], and how to feel at peace with ourselves. We want to know that these negative feelings will not always be with [place]. No, I do not have a magic wand that will solve all your worries.What I offer is the opportunity to listen to your story and help you develop  As a graduate of [employer], with a Masters of arts in counseling psychology, I serve a diverse population while personalizing care. The skills used come from art therapy, play therapy, CBT(Cognitive Behavioral Therapy), EMDR, and other specialized therapeutic interventions. I cater to my client's needs while using best practices. I started my counseling practice with the desire of "helping others heal" from the conflict they have faced in life and their personal struggles. I have had the honor of assisting people overcome many of their life struggles and to move  forward and enjoy the life that they wish to have. Give me a call and let me know how I can help you.   [facility]  [address], [place], [place] [address]p:[phone] fax:[phone]  Our Practice  Psychotherapy, which is also often referred to as talk therapy, is a mental health treatment that addresses the difficulties one encounters as the result of mental illness, emotional trauma, adjustment difficulties, and a wide variety of mental health challenges. Psychotherapy is a treatment that is intended to assist a person in controlling and eliminating negative symptoms that have a problematic impact on one's daily functioning and thought processes. The desired outcome is improved functioning, increased well-being, and emotional healing.    Our Treatment Focus Our focus is to help individuals heal, energize, and become aware of their inner strengths. We achieve this by providing a neutral safe space, listening to your concerns, and customizing a treatment plan.   Our Patient Promise We promise to be there for you every step of your journey. Our goal is to help you grow from your struggles, heal from your pain, and move forward to where you want to be in your life.       Frederic Jericho, MSW, [place], [facility] [phone] [address] [place], [place] [address] Office [phone] Fax [phone]  I am pleased that you have selected me  as your therapist.  I have a Child psychotherapist of Social Work degree, obtained at the [employer] [place] in 2000.  I am licensed with the [facility] Social Work Public librarian [phone]).   The services offered to you include individual, family, and group counseling.  My therapeutic approach  is derived from my training and experience in Cognitive-Behavioral Therapy, Solution Focused Therapy, Reality Therapy, as well as, interpersonal and developmental theories of counseling.  I have special interests in anxiety,  depression, sexual and physical abuse, play therapy, child and adolescent development, and family systems.  I am specifically trained in Trauma Focused-Cognitive Behavioral Therapy (TF-CBT).  I will not discriminate because of age, race, gender, sexual orientation or religion.  If there is anything I should know concerning your culture or religion, I ask that you please inform me so that I can better understand you.  If for any reason, I determine that my knowledge and expertise are not sufficient for your particular needs, I will make every effort to refer you to another counselor who is prepared to work more effectively with you.  Are you or a loved one dealing with the aftermath of a traumatic experience? Do life transitions leave you grappling with feelings of depression or anxiety? Are you concerned about your child's negative behaviors, sleep disturbances, or difficulties during a divorce? It's important to understand that children primarily operate based on emotions, while adults tend to use rational thinking. This emotional focus can impact a child's ability to make effective decisions and express their feelings.   Services Individual Counseling  Individual counseling is a personal opportunity to receive support and experience growth during challenging times in life. Individual counseling can help one deal with many personal topics in life such as anger, depression, anxiety, substance abuse, marriage and relationship challenges, parenting problems, school difficulties, career changes, etc.  Family Counseling  Families often are faced with issues and challenges that greatly impact all the members. The goal of family therapy is to renew and maintain the natural family structure and support group with an emphasis on returning the individual to a healthy family and community life. Family counseling can help improve communication, resolve conflict, and improve connections. [/one-half-first]  Parenting  Coordinator Parent Coordinator is a neutral third party acting in the children's best interest attempting to reach a fair compromise of the issues at hand. Assisting parents manage their parenting plan, improve communication, and resolve disputes. A court appointment neutral party.  Treatment specialization includes: Depression  Anxiety  Attention Deficit/Hyperactive Disorder  Grief Counseling  Conflict Resolution  Emotional Regulation  Family Counseling  Divorce/Separation  Mediation  Conflict Resolution  Relationship Building  Parenting Coordinator  Parental Support  Please contact one of the following facilities to start medication management and therapy services:   [facility] [address]  [place], [place] [address] [phone]   [facility]  [address] [place], [place] [address] [phone]  [facility] [place]  [address] [place] [place], [place] [address] [phone]  [facility]  [address]  [place], [place] [address] [phone]  [facility]  [address] [place], [place] [address] [phone]  [facility]  [address] Renee Rival  [place], [place] [address] [phone]  [facility] Address: [address] [place], [place], [place] [address] Phone: [phone] Services: Specialize in outpatient and substance abuse therapy for couples, family and children  [facility] Address: [address], [place], [place] [address] Phone: [phone]  [facility] Address: [address], [place], [place] [address] Phone: [phone]  Layla Barter, NP 07/05/2023, 12:36 PM

## 2023-07-05 NOTE — ED Notes (Signed)
 Patient discharged in stable condition with all belonging via [employer] escort to home. Patient denies SI, HI, AVH. Patient does not appear to be responding to internal stimuli.

## 2023-09-30 ENCOUNTER — Other Ambulatory Visit: Payer: Self-pay | Admitting: Neurology

## 2023-09-30 DIAGNOSIS — G35 Multiple sclerosis: Secondary | ICD-10-CM

## 2023-10-27 ENCOUNTER — Ambulatory Visit: Payer: Self-pay | Admitting: Neurology

## 2023-12-23 ENCOUNTER — Telehealth: Payer: Self-pay | Admitting: Neurology

## 2023-12-23 NOTE — Telephone Encounter (Signed)
 Last seen 09/17/22. Has no f/u scheduled currently.  She cx 10/27/23 visit, notes states she could not afford.Spoke w/ Gaynell/billing because her chart is showing bad debt. Per Gaynell, she has an 80 cent balance. Ok to schedule appt.   I called pt at [phone]. Went straight to VM, LVM for her to call.    Phone room: please let pt know she will need to come in for appt to be further evaluated/discuss options with Dr. Vear. Please offer 12/25/23 at 9:30am.

## 2023-12-23 NOTE — Telephone Encounter (Signed)
 Patient asking what's the steps to get a stronger medication. Patient said body has gotten use to VUMERITY  231 MG CPDR and would like to try something diffierent. Would like a call back before 11 am any day of the week.

## 2023-12-23 NOTE — Telephone Encounter (Signed)
 Called pt at [phone]. Received automated message the VM not set up, cannot LVM

## 2023-12-24 NOTE — Telephone Encounter (Signed)
 Pt has called RN back

## 2023-12-24 NOTE — Telephone Encounter (Signed)
 Called pt at [phone].  VM not set up, unable to LVM

## 2023-12-24 NOTE — Telephone Encounter (Signed)
 Took call from phone room and spoke w/ pt. She cannot afford to pay for visit current d/t financial strain. Per MD, ok to see no charge tomorrow. Discussed financial assistance application w/ pt via [facility]. Printed and will provide at visit tomorrow. Pt verbalized understanding and appreciation.

## 2023-12-25 ENCOUNTER — Encounter: Payer: Self-pay | Admitting: Neurology

## 2023-12-25 ENCOUNTER — Ambulatory Visit (INDEPENDENT_AMBULATORY_CARE_PROVIDER_SITE_OTHER): Payer: Self-pay | Admitting: Neurology

## 2023-12-25 VITALS — BP 112/73 | HR 75 | Ht 64.0 in | Wt 176.0 lb

## 2023-12-25 DIAGNOSIS — R269 Unspecified abnormalities of gait and mobility: Secondary | ICD-10-CM

## 2023-12-25 DIAGNOSIS — R42 Dizziness and giddiness: Secondary | ICD-10-CM

## 2023-12-25 DIAGNOSIS — Z5971 Insufficient health insurance coverage: Secondary | ICD-10-CM

## 2023-12-25 DIAGNOSIS — G35 Multiple sclerosis: Secondary | ICD-10-CM

## 2023-12-25 DIAGNOSIS — Z79899 Other long term (current) drug therapy: Secondary | ICD-10-CM

## 2023-12-25 MED ORDER — SERTRALINE HCL 50 MG PO TABS: 50.0000 mg | ORAL_TABLET | Freq: Every day | ORAL | 11 refills | Status: AC

## 2023-12-25 MED ORDER — PREDNISONE 50 MG PO TABS: ORAL_TABLET | ORAL | 0 refills | Status: DC

## 2023-12-25 NOTE — Progress Notes (Signed)
 [facility]  PATIENT: Kelly Benson DOB: 04-16-1996  REFERRING DOCTOR OR PCP: None SOURCE: Patient, emergency room and hospital admission and discharge notes, imaging and laboratory reports, MRI images personally reviewed.  _________________________________   HISTORICAL  CHIEF COMPLAINT:  Chief Complaint  Patient presents with   Follow-up    Pt in room 10. Alone.Here for MS follow up on Vumerity . Patient reports flare started this week, dizziness, brain fog, tongue numbness, sensitive to light. No falls, no pain. Slow pace when walking. Pt said if she moves too fast she will get dizziness, discuss a different MS medication.    HISTORY OF PRESENT ILLNESS:  Kelly Benson is a 28 y.o. 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   Update 12/25/2023: She is on Vumerity .  She probably had an exacerbation with ON and facial twitching last year and probably another one recently with vertigo.   She is on patient assistance.    She has tried to get Medicaid or disabiity and has been unable to.     She is experiencing light sensitivity and ophthalmology told her she has lens changed in the left eye.  She has myopia and astigmatism and just got new glasses.     She had the onset of vertigo in 2020 but this worsened 2 -3 weeks ago.  She notes it most when she turns.  She is also noting irritability and depression.  She gets angry easily.   She has never been on an antidepressant.   She has reduced vision on the left     She feels off balanced and dizzy and speech is different.  When she stands up she feels lie she is going to fall backwards.    Symptoms seem worse after she is awake longer.  Her last MRI's were in 2020 (April and October and April)  and she had enhancing lesions in both the brain and cervical spine.  I saw her after these MRIs and initiated Vumerity .  She has never been on another DMT.  She does not have insurance.  We discussed  trying to get an MRI covered by the [employer].  Gait has worsened compared to her last visit.  She cannot go fall without support.  She had had a Lhermitte sign in the past and is experiencing 1 again.  She has urinary urgency and occasional urge incontinence.   Bowel is doing well.    She is always fatigued, especially if more active.   She has noted mild problems with cognition.  She sleeps well.  She has had some depression.    MS History She  was diagnosed with MS in April 2017.   In 2017, the speech was more slurred and she felt off balanced.  She went to the ED and MRI was consistent with MS.   She did not follow up with a neurologist.   She did well until 2 weeks ago.    She had a numbing sensation in her left middle finger that then increased to the 4th and 5th finger.  Over the next 2 days, numbness developed in the left body from the hand on down.   Looking down, she has a Lhermitte sign.    She went to the ED on 02/24/19, a few days after symptoms appeared.    She received 5 days of IV Solumedrol and she did PT in the hospital and occupational therapy.   She notes left sided weakness and clumsiness.  She has severe numbness in the 3rd-5th fingers on the left and moderate numbness in the left  flank and perineum.   Bladder function is worse with urge incontinence at times.    She notes that her vision is worse OD.      IMAGING MRIs of the brain from 02/25/2019 and compared them to the MRI from 10/14/2018.  MRI of the brain shows multiple T2/flair hyperintense foci in the hemispheres and also a focus in the right cerebral peduncle and 1 in the left thalamus.  There are several T2 hyperintense foci in the spinal cord.   I also reviewed her first MRI from 09/02/2015.  There are fewer foci on that MRI compared to the 2020 studies but some of the foci were enhancing at that time.SABRA  MRI of the cervical spine 02/25/2019 showed an enhancing lesion to the left adjacent to C2 and another lesion posteriorly to  the right at that level.  There is a posterior lesion, a little more to the left, a little more to the left at C4-C5 with subtle enhancement.  There is another focus centrally and anteriorly adjacent to C6-C7.  The focus to the left at C2 was not present on the previous MRI from June 2020.    REVIEW OF SYSTEMS: Constitutional: No fevers, chills, sweats, or change in appetite Eyes: No visual changes, double vision, eye pain Ear, nose and throat: No hearing loss, ear pain, nasal congestion, sore throat Cardiovascular: No chest pain, palpitations Respiratory:  No shortness of breath at rest or with exertion.   No wheezes GastrointestinaI: No nausea, vomiting, diarrhea, abdominal pain, fecal incontinence Genitourinary:  No dysuria, urinary retention or frequency.  No nocturia. Musculoskeletal:  No neck pain, back pain Integumentary: No rash, pruritus, skin lesions Neurological: as above Psychiatric: No depression at this time.  No anxiety Endocrine: No palpitations, diaphoresis, change in appetite, change in weigh or increased thirst Hematologic/Lymphatic:  No anemia, purpura, petechiae. Allergic/Immunologic: No itchy/runny eyes, nasal congestion, recent allergic reactions, rashes  ALLERGIES: No Known Allergies  HOME MEDICATIONS:  Current Outpatient Medications:    sertraline  (ZOLOFT ) 50 MG tablet, Take 1 tablet (50 mg total) by mouth daily., Disp: 30 tablet, Rfl: 11   VITAMIN D  PO, Take 1 Dose by mouth daily., Disp: , Rfl:    VITAMIN E PO, Take 1 Dose by mouth daily., Disp: , Rfl:    VUMERITY  231 MG CPDR, TAKE 1 CAPSULE (231MG ) BY MOUTH TWICE DAILY FOR THE FIRST 7 DAYS, THEN TAKE 2 CAPSULES (462MG ) TWICE DAILY THEREAFTER, Disp: 106 capsule, Rfl: 0   predniSONE  (DELTASONE ) 50 MG tablet, 20 pills (1000 mg) po qd x 3days for MS exacerbation, Disp: 60 tablet, Rfl: 0  PAST MEDICAL HISTORY: Past Medical History:  Diagnosis Date   MS (multiple sclerosis) (HCC)    Plantar fasciitis     Scoliosis     PAST SURGICAL HISTORY: Past Surgical History:  Procedure Laterality Date   NO PAST SURGERIES      FAMILY HISTORY: Family History  Problem Relation Age of Onset   Hypertension Father    Multiple sclerosis Neg Hx     SOCIAL HISTORY:  Social History   Socioeconomic History   Marital status: Married    Spouse name: Not on file   Number of children: 0   Years of education: College 15 years   Highest education level: Not on file  Occupational History   Occupation: [employer] engineer  Tobacco Use   Smoking status: Never  Smokeless tobacco: Never  Vaping Use   Vaping status: Never Used  Substance and Sexual Activity   Alcohol use: Not Currently    Comment: rare   Drug use: No   Sexual activity: Yes    Birth control/protection: None  Other Topics Concern   Not on file  Social History Narrative    Right handed    Tea/soda daily   Lives with husband   Social Drivers of Corporate investment banker Strain: Not on file  Food Insecurity: Not on file  Transportation Needs: Not on file  Physical Activity: Not on file  Stress: Not on file  Social Connections: Not on file  Intimate Partner Violence: Not on file     PHYSICAL EXAM  Vitals:   12/25/23 0913  BP: 112/73  Pulse: 75  SpO2: 98%  Weight: 176 lb (79.8 kg)  Height: 5' 4 (1.626 m)    Body mass index is 30.21 kg/m.    General: The patient is well-developed and well-nourished and in no acute distress  Skin: Extremities are without rash or  edema.  Neurologic Exam  Mental status: The patient is alert and oriented x 3 at the time of the examination.Flat affect, reduced focus/attention.   Speech is normal.  Cranial nerves: Extraocular movements are full.  She has a mild left APD.  There is reduced color saturation and acuity OS vs OD.  .  Facial symmetry is present.  Facial strength is normal..  Trapezius and sternocleidomastoid strength is normal. No dysarthria is noted.  Hearing is normal  and symmetric.  Motor:  Muscle bulk is normal.   Tone is normal. Strength is symmetric 5- to 5 in all  Sensory: She has slight reduced sensation in the left fourth and fifth fingers  Coordination: Cerebellar testing reveals reduced finger-nose-finger, little worse on right and reduced heel-to-shin.  Gait and station: Station is normal.  Gait is mildly wide and she cannot tandem walk  Romberg is positive.   Reflexes: Deep tendon reflexes are symmetric and normal in arms.  Increased in legs but no ankle clonus.SABRA      DIAGNOSTIC DATA (LABS, IMAGING, TESTING) - I reviewed patient records, labs, notes, testing and imaging myself where available.  Lab Results  Component Value Date   WBC 7.6 09/17/2022   HGB 13.4 09/17/2022   HCT 40.2 09/17/2022   MCV 82 09/17/2022   PLT 289 09/17/2022       ASSESSMENT AND PLAN  Multiple sclerosis exacerbation (HCC) - Plan: QuantiFERON-TB Gold Plus, HIV Antibody (routine testing w rflx), Hep B Surface Antigen, Hepatitis B Core AB, Total, IgG, IgA, IgM, CBC with Differential/Platelet  High risk medication use - Plan: QuantiFERON-TB Gold Plus, HIV Antibody (routine testing w rflx), Hep B Surface Antigen, Hepatitis B Core AB, Total, IgG, IgA, IgM, CBC with Differential/Platelet  Gait disturbance  Vertigo  Does not have health insurance   1.   She is having an MS exacerbation with vertigo and off balanced gait.  I will have her do 3 days of high-dose oral prednisone .  For now continue Vumerity  but if we can get patiebt assistance will swithc to [employer].   I will check the appropriate labs. 2.  Stay active and exercise as tolerated. 3.  She has not had an MRI in 4-5 years as insurance has been off and on for her.  I will see if we can get 1 covered by the [employer] -addendum they have discontinued this program.  She also filled out the [facility] patient assistance and we will not charge for this visit Return in 6 months or sooner if she enters a study or  for new or worsening neurologic symptoms.  Soloman Mckeithan A. Vear, MD, [facility] [place] 12/25/2023, 10:09 AM Certified in Neurology, Clinical Neurophysiology, Sleep Medicine and Neuroimaging  [facility] [address] [place], [place] [address] [phone]

## 2023-12-29 ENCOUNTER — Encounter: Payer: Self-pay | Admitting: *Deleted

## 2023-12-29 ENCOUNTER — Ambulatory Visit: Payer: Self-pay | Admitting: Neurology

## 2023-12-29 LAB — QUANTIFERON-TB GOLD PLUS
QuantiFERON Mitogen Value: >10 [IU]/mL
QuantiFERON Nil Value: 0.02 [IU]/mL
QuantiFERON TB1 Ag Value: 0.02 [IU]/mL
QuantiFERON TB2 Ag Value: 0.02 [IU]/mL

## 2023-12-29 LAB — HEPATITIS B CORE ANTIBODY, TOTAL: Hep B Core Total Ab: NEGATIVE

## 2023-12-29 LAB — CBC WITH DIFFERENTIAL/PLATELET
Basophils Absolute: 0 x10E3/uL (ref 0.0–0.2)
Basos: 0 %
EOS (ABSOLUTE): 0 x10E3/uL (ref 0.0–0.4)
Eos: 1 %
Hematocrit: 40.5 % (ref 34.0–46.6)
Hemoglobin: 12.7 g/dL (ref 11.1–15.9)
Immature Grans (Abs): 0 x10E3/uL (ref 0.0–0.1)
Immature Granulocytes: 0 %
Lymphocytes Absolute: 1.1 x10E3/uL (ref 0.7–3.1)
Lymphs: 23 %
MCH: 25.9 pg — ABNORMAL LOW (ref 26.6–33.0)
MCHC: 31.4 g/dL — ABNORMAL LOW (ref 31.5–35.7)
MCV: 83 fL (ref 79–97)
Monocytes Absolute: 0.4 x10E3/uL (ref 0.1–0.9)
Monocytes: 8 %
Neutrophils Absolute: 3.2 x10E3/uL (ref 1.4–7.0)
Neutrophils: 68 %
Platelets: 278 x10E3/uL (ref 150–450)
RBC: 4.91 x10E6/uL (ref 3.77–5.28)
RDW: 14.3 % (ref 11.7–15.4)
WBC: 4.7 x10E3/uL (ref 3.4–10.8)

## 2023-12-29 LAB — HIV ANTIBODY (ROUTINE TESTING W REFLEX): HIV Screen 4th Generation wRfx: NONREACTIVE

## 2023-12-29 LAB — HEPATITIS B SURFACE ANTIGEN: Hepatitis B Surface Ag: NEGATIVE

## 2023-12-29 LAB — IGG, IGA, IGM
IgA/Immunoglobulin A, Serum: 162 mg/dL (ref 87–352)
IgG (Immunoglobin G), Serum: 1448 mg/dL (ref 586–1602)
IgM (Immunoglobulin M), Srm: 70 mg/dL (ref 26–217)

## 2023-12-29 NOTE — Telephone Encounter (Signed)
 Patient called back, Spoke with patient and informed her of the below. Start pending to be signed by Dr.Sater.

## 2023-12-29 NOTE — Telephone Encounter (Signed)
 Pt is calling in response to missed call from [facility]MA

## 2023-12-31 ENCOUNTER — Telehealth: Payer: Self-pay | Admitting: *Deleted

## 2023-12-31 DIAGNOSIS — G35 Multiple sclerosis: Secondary | ICD-10-CM

## 2023-12-31 NOTE — Telephone Encounter (Signed)
Faxed completed/signed [facility] start form to [facility] at [phone]. Received fax confirmation.  

## 2024-01-02 ENCOUNTER — Encounter (HOSPITAL_COMMUNITY): Payer: Self-pay

## 2024-01-02 ENCOUNTER — Other Ambulatory Visit: Payer: Self-pay

## 2024-01-02 ENCOUNTER — Emergency Department (HOSPITAL_COMMUNITY): Payer: Self-pay

## 2024-01-02 ENCOUNTER — Inpatient Hospital Stay (HOSPITAL_COMMUNITY)
Admission: EM | Admit: 2024-01-02 | Discharge: 2024-01-05 | DRG: 060 | Disposition: A | Discharge status: Home / Self Care | Payer: Self-pay | Attending: Family Medicine | Admitting: Family Medicine

## 2024-01-02 DIAGNOSIS — Z5971 Insufficient health insurance coverage: Secondary | ICD-10-CM

## 2024-01-02 DIAGNOSIS — H538 Other visual disturbances: Secondary | ICD-10-CM | POA: Diagnosis present

## 2024-01-02 DIAGNOSIS — M419 Scoliosis, unspecified: Secondary | ICD-10-CM | POA: Diagnosis present

## 2024-01-02 DIAGNOSIS — Z79899 Other long term (current) drug therapy: Secondary | ICD-10-CM

## 2024-01-02 DIAGNOSIS — Z8249 Family history of ischemic heart disease and other diseases of the circulatory system: Secondary | ICD-10-CM

## 2024-01-02 DIAGNOSIS — M722 Plantar fascial fibromatosis: Secondary | ICD-10-CM | POA: Diagnosis present

## 2024-01-02 DIAGNOSIS — G35 Multiple sclerosis: Principal | ICD-10-CM | POA: Diagnosis present

## 2024-01-02 DIAGNOSIS — R5381 Other malaise: Secondary | ICD-10-CM | POA: Diagnosis present

## 2024-01-02 DIAGNOSIS — R270 Ataxia, unspecified: Secondary | ICD-10-CM | POA: Diagnosis present

## 2024-01-02 DIAGNOSIS — Z1152 Encounter for screening for COVID-19: Secondary | ICD-10-CM

## 2024-01-02 DIAGNOSIS — E876 Hypokalemia: Secondary | ICD-10-CM | POA: Diagnosis present

## 2024-01-02 LAB — BASIC METABOLIC PANEL WITH GFR
Anion gap: 10 (ref 5–15)
BUN: 16 mg/dL (ref 6–20)
CO2: 26 mmol/L (ref 22–32)
Calcium: 8.6 mg/dL — ABNORMAL LOW (ref 8.9–10.3)
Chloride: 106 mmol/L (ref 98–111)
Creatinine, Ser: 0.57 mg/dL (ref 0.44–1.00)
GFR, Estimated: >60 mL/min (ref >60)
Glucose, Bld: 105 mg/dL — ABNORMAL HIGH (ref 70–99)
Potassium: 3.3 mmol/L — ABNORMAL LOW (ref 3.5–5.1)
Sodium: 142 mmol/L (ref 135–145)

## 2024-01-02 LAB — CBC WITH DIFFERENTIAL/PLATELET
Abs Immature Granulocytes: 0.05 K/uL (ref 0.00–0.07)
Basophils Absolute: 0 K/uL (ref 0.0–0.1)
Basophils Relative: 0 %
Eosinophils Absolute: 0.1 K/uL (ref 0.0–0.5)
Eosinophils Relative: 1 %
HCT: 43.9 % (ref 36.0–46.0)
Hemoglobin: 13.8 g/dL (ref 12.0–15.0)
Immature Granulocytes: 1 %
Lymphocytes Relative: 30 %
Lymphs Abs: 2.7 K/uL (ref 0.7–4.0)
MCH: 25.9 pg — ABNORMAL LOW (ref 26.0–34.0)
MCHC: 31.4 g/dL (ref 30.0–36.0)
MCV: 82.5 fL (ref 80.0–100.0)
Monocytes Absolute: 0.7 K/uL (ref 0.1–1.0)
Monocytes Relative: 8 %
Neutro Abs: 5.3 K/uL (ref 1.7–7.7)
Neutrophils Relative %: 60 %
Platelets: 310 K/uL (ref 150–400)
RBC: 5.32 MIL/uL — ABNORMAL HIGH (ref 3.87–5.11)
RDW: 14.5 % (ref 11.5–15.5)
WBC: 8.9 K/uL (ref 4.0–10.5)
nRBC: 0 % (ref 0.0–0.2)

## 2024-01-02 LAB — URINALYSIS, ROUTINE W REFLEX MICROSCOPIC
Bilirubin Urine: NEGATIVE
Glucose, UA: NEGATIVE mg/dL
Hgb urine dipstick: NEGATIVE
Ketones, ur: NEGATIVE mg/dL
Leukocytes,Ua: NEGATIVE
Nitrite: NEGATIVE
Protein, ur: NEGATIVE mg/dL
Specific Gravity, Urine: 1.018 (ref 1.005–1.030)
pH: 8 (ref 5.0–8.0)

## 2024-01-02 LAB — HCG, SERUM, QUALITATIVE: Preg, Serum: NEGATIVE

## 2024-01-02 LAB — CBG MONITORING, ED: Glucose-Capillary: 108 mg/dL — ABNORMAL HIGH (ref 70–99)

## 2024-01-02 LAB — MAGNESIUM: Magnesium: 1.9 mg/dL (ref 1.7–2.4)

## 2024-01-02 MED ORDER — GADOBUTROL 1 MMOL/ML IV SOLN
8.0000 mL | Freq: Once | INTRAVENOUS | Status: AC | PRN
  Administered 2024-01-02: 8 mL via INTRAVENOUS

## 2024-01-02 MED ORDER — VITAMIN B-12 1000 MCG PO TABS
1000.0000 ug | ORAL_TABLET | Freq: Every day | ORAL | Status: DC
  Administered 2024-01-03 – 2024-01-05 (×3): 1000 ug via ORAL
  Filled 2024-01-02 (×3): qty 1

## 2024-01-02 MED ORDER — FOLIC ACID 1 MG PO TABS
1.0000 mg | ORAL_TABLET | Freq: Every day | ORAL | Status: DC
  Administered 2024-01-03 – 2024-01-05 (×3): 1 mg via ORAL
  Filled 2024-01-02 (×3): qty 1

## 2024-01-02 MED ORDER — ACETAMINOPHEN 325 MG PO TABS: 650.0000 mg | ORAL_TABLET | Freq: Four times a day (QID) | ORAL | Status: DC | PRN

## 2024-01-02 MED ORDER — ONDANSETRON HCL 4 MG/2ML IJ SOLN: 4.0000 mg | Freq: Four times a day (QID) | INTRAMUSCULAR | Status: DC | PRN

## 2024-01-02 MED ORDER — POTASSIUM CHLORIDE CRYS ER 20 MEQ PO TBCR
40.0000 meq | EXTENDED_RELEASE_TABLET | Freq: Once | ORAL | Status: AC
  Administered 2024-01-02: 40 meq via ORAL
  Filled 2024-01-02: qty 2

## 2024-01-02 MED ORDER — ACETAMINOPHEN 650 MG RE SUPP: 650.0000 mg | Freq: Four times a day (QID) | RECTAL | Status: DC | PRN

## 2024-01-02 MED ORDER — DIROXIMEL FUMARATE 231 MG PO CPDR
462.0000 mg | DELAYED_RELEASE_CAPSULE | Freq: Two times a day (BID) | ORAL | Status: DC
  Administered 2024-01-04: 462 mg via ORAL

## 2024-01-02 MED ORDER — SODIUM CHLORIDE 0.9% FLUSH
3.0000 mL | Freq: Two times a day (BID) | INTRAVENOUS | Status: DC
  Administered 2024-01-02 – 2024-01-05 (×5): 3 mL via INTRAVENOUS

## 2024-01-02 MED ORDER — ONDANSETRON HCL 4 MG PO TABS: 4.0000 mg | ORAL_TABLET | Freq: Four times a day (QID) | ORAL | Status: DC | PRN

## 2024-01-02 MED ORDER — SODIUM CHLORIDE 0.9 % IV SOLN
1000.0000 mg | INTRAVENOUS | Status: AC
  Administered 2024-01-02 – 2024-01-03 (×2): 1000 mg via INTRAVENOUS
  Filled 2024-01-02 (×2): qty 16

## 2024-01-02 MED ORDER — SERTRALINE HCL 25 MG PO TABS
50.0000 mg | ORAL_TABLET | Freq: Every day | ORAL | Status: DC
  Administered 2024-01-03 – 2024-01-05 (×3): 50 mg via ORAL
  Filled 2024-01-02 (×3): qty 2

## 2024-01-02 MED ORDER — ENOXAPARIN SODIUM 40 MG/0.4ML IJ SOSY
40.0000 mg | PREFILLED_SYRINGE | INTRAMUSCULAR | Status: DC
  Administered 2024-01-02: 40 mg via SUBCUTANEOUS
  Filled 2024-01-02 (×2): qty 0.4

## 2024-01-02 NOTE — H&P (Signed)
 History and Physical    Patient: Kelly Benson FMW:[id number] DOB: 01-27-1996 DOA: 01/02/2024 DOS: the patient was seen and examined on 01/02/2024 PCP: Patient, No Pcp Per  Patient coming from: Home  Chief Complaint:  Chief Complaint  Patient presents with   MS Flareup   HPI: Kelly Benson is a 28 y.o. female with a history of multiple sclerosis who presented to the ED with worsening weakness, imbalance, and dizzy. She tried taking prednisone  for a few days (8/17-8/19) per her neurologist, though feels during that time her symptoms actually got worse. 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 Severe enough for her to activate EMS. She didn't lose consciousness, but felt too weak to get up or walk around anymore. Some blurry vision. No other changes to medications. This is not her worst flare up, but it feels similar to prior.   She was evaluated in the ED with MRI and neurology consultation, hospitalist admission requested for high dose IV steroids.    Review of Systems: As mentioned in the history of present illness. All other systems reviewed and are negative. Past Medical History:  Diagnosis Date   MS (multiple sclerosis) (HCC)    Plantar fasciitis    Scoliosis    Past Surgical History:  Procedure Laterality Date   NO PAST SURGERIES     Social History:  reports that she has never smoked. She has never used smokeless tobacco. She reports that she does not currently use alcohol. She reports that she does not use drugs.  No Known Allergies  Family History  Problem Relation Age of Onset   Hypertension Father    Multiple sclerosis Neg Hx     Prior to Admission medications   Medication Sig Start Date End Date Taking? Authorizing Provider  Ascorbic Acid (VITAMIN C PO) Take 1 tablet by mouth daily. Dosage unknown   Yes [provider]  Cyanocobalamin (B-12 PO) Take 1  tablet by mouth daily. Dosage unknown   Yes [provider]  folic acid  (FOLVITE ) 1 MG tablet Take 1 mg by mouth daily.   Yes [provider]  MAGNESIUM PO Take 1 tablet by mouth daily. Dosage unknown   Yes [provider]  Omega-3 Fatty Acids (OMEGA 3 FISH OIL PO) Take 1 capsule by mouth daily. Dosage unknown   Yes [provider]  sertraline  (ZOLOFT ) 50 MG tablet Take 1 tablet (50 mg total) by mouth daily. 12/25/23  Yes Sater, Charlie LABOR, MD  VUMERITY  231 MG CPDR TAKE 1 CAPSULE (231MG ) BY MOUTH TWICE DAILY FOR THE FIRST 7 DAYS, THEN TAKE 2 CAPSULES (462MG ) TWICE DAILY THEREAFTER 10/03/22  Yes Sater, Charlie LABOR, MD  predniSONE  (DELTASONE ) 50 MG tablet 20 pills (1000 mg) po qd x 3days for MS exacerbation Patient not taking: Reported on 01/02/2024 12/25/23   Vear Charlie LABOR, MD    Physical Exam: Vitals:   01/02/24 1333 01/02/24 1335 01/02/24 1336 01/02/24 1857  BP: 139/85   125/78  Pulse: 89   73  Resp: 20   18  Temp: 98.5 F (36.9 C)   98.3 F (36.8 C)  TempSrc: Oral   Oral  SpO2:  100% 100% 100%  Gen: No distress, calm Pulm: Clear, nonlabored  CV: RRR, no MRG or pitting edema GI: Soft, NT, ND, +BS Neuro: Alert and oriented, no dysarthria, no facial asymmetry. No feet clonus, sensation intact throughout,  4/5 strength globally.  Ext: Warm, no deformities, good DP and radial pulses Skin: No rashes, lesions or ulcers on visualized skin   Data Reviewed: K 3.3 Glucose 105 (random)  WBC, Hgb, Plt wnl UPT neg UA neg MR brain, cervical, thoracic spine: pending  Assessment and Plan: MS flare:  - Completed 3 days prednisone  as outpatient, symptoms worsening. Seen by neurology who has concern for flare vs. pseudo-flare due to stressors, though high dose IV steroids recommended to complete a 5 day course (today, tomorrow, IV solumedrol 1,000mg  ordered per neuro).  - PT/OT evaluations in AM - Reordered home diroximel - Follow up MRI (radiologist  interpretation pending at admission).   Hypokalemia:  - Supplement   Advance Care Planning: Full code  Consults: Neurology, Dr. Michaela  Family Communication: None at bedside  Severity of Illness: The appropriate patient status for this patient is OBSERVATION. Observation status is judged to be reasonable and necessary in order to provide the required intensity of service to ensure the patient's safety. The patient's presenting symptoms, physical exam findings, and initial radiographic and laboratory data in the context of their medical condition is felt to place them at decreased risk for further clinical deterioration. Furthermore, it is anticipated that the patient will be medically stable for discharge from the hospital within 2 midnights of admission.   Author: Bernardino KATHEE Come, MD 01/02/2024 9:39 PM  For on call review [email or web address].

## 2024-01-02 NOTE — ED Notes (Signed)
 Patient assisted to RR by wheelchair for urine sample.

## 2024-01-02 NOTE — Consult Note (Signed)
 NEUROLOGY CONSULT NOTE   Date of service: January 02, 2024 Patient Name: Kelly Benson MRN:  [id number] DOB:  23-May-1995 Chief Complaint: Weakness, dizziness and MS flare  Requesting Provider: Towana Ozell BROCKS, MD  History of Present Illness  Kelly Benson is a 28 y.o. female with hx of MS who presents to the ED for continued dizziness and weakness. She states last week she had a MS flare and called Dr. Duncan office and she was given a 3 day course of steroids which ended on Wednesday that she states did not really help. She states in the past steroids had helped with her flare ups. She states today she was at work and was going to fill her water bottle up when she suddenly got very dizzy and weak all over. She states the dizziness is like being on a merry go round. Lately she has needed to hold onto things to walk and she has been very slow to walk. She denies having any falls.   ROS   Comprehensive ROS performed and pertinent positives documented in HPI    Past History   Past Medical History:  Diagnosis Date   MS (multiple sclerosis) (HCC)    Plantar fasciitis    Scoliosis     Past Surgical History:  Procedure Laterality Date   NO PAST SURGERIES      Family History: Family History  Problem Relation Age of Onset   Hypertension Father    Multiple sclerosis Neg Hx     Social History  reports that she has never smoked. She has never used smokeless tobacco. She reports that she does not currently use alcohol. She reports that she does not use drugs.  No Known Allergies  Medications  No current facility-administered medications for this encounter.  Current Outpatient Medications:    predniSONE  (DELTASONE ) 50 MG tablet, 20 pills (1000 mg) po qd x 3days for MS exacerbation, Disp: 60 tablet, Rfl: 0   sertraline  (ZOLOFT ) 50 MG tablet, Take 1 tablet (50 mg total) by mouth daily., Disp: 30 tablet, Rfl: 11   VITAMIN D  PO, Take 1 Dose by mouth daily., Disp: , Rfl:     VITAMIN E PO, Take 1 Dose by mouth daily., Disp: , Rfl:    VUMERITY  231 MG CPDR, TAKE 1 CAPSULE (231MG ) BY MOUTH TWICE DAILY FOR THE FIRST 7 DAYS, THEN TAKE 2 CAPSULES (462MG ) TWICE DAILY THEREAFTER, Disp: 106 capsule, Rfl: 0  Vitals   Vitals:   01/02/24 1333 01/02/24 1335 01/02/24 1336  BP: 139/85    Pulse: 89    Resp: 20    Temp: 98.5 F (36.9 C)    TempSrc: Oral    SpO2:  100% 100%    There is no height or weight on file to calculate BMI.   Physical Exam   Constitutional: Appears well-developed and well-nourished.   Psych: Affect appropriate to situation.   Eyes: No scleral injection.   HENT: No OP obstruction.   Head: Normocephalic.   Cardiovascular: Normal rate and regular rhythm.   Respiratory: Effort normal, non-labored breathing.   GI: Soft.  No distension. There is no tenderness.   Skin: WDI.    Neurologic Examination   Mental Status -  Level of arousal and orientation to time, place, and person were intact . Language including expression, naming, repetition, comprehension was assessed and found intact . Attention span and concentration were normal . Recent and remote memory were intact . Fund of Knowledge was assessed and was intact .  Cranial Nerves II - XII - II - Visual field intact OU . III, IV, VI - Extraocular movements intact . V - Facial sensation intact bilaterally . VII - Facial movement intact bilaterally . VIII - Hearing & vestibular intact bilaterally . X - Palate elevates symmetrically . XI - Chin turning & shoulder shrug intact bilaterally . XII - Tongue protrusion intact .  Motor Strength - The patient's strength was normal in bilateral uppers and right lower 4+/5 with  mild drift and left leg 4-/5 with minimal drift, Weak dorsiflexion and plantar flexion on left compared to the right Bulk was normal and fasciculations were absent .   Motor Tone - Muscle tone was assessed at the neck and appendages and was normal . Reflexes - The patient's  reflexes were symmetrical in all extremities and she had no pathological reflexes . Sensory - Light touch, temperature/pinprick were assessed and were symmetrical .   Coordination - The patient had normal movements in the hands and feet with no ataxia or dysmetria.  Tremor was absent. Gait and Station - deferred.  Labs/Imaging/Neurodiagnostic studies   CBC:  Recent Labs  Lab 01-05-24 1339  WBC 8.9  NEUTROABS 5.3  HGB 13.8  HCT 43.9  MCV 82.5  PLT 310   Basic Metabolic Panel:  Lab Results  Component Value Date   NA 136 10/28/2020   K 4.1 10/28/2020   CO2 24 10/28/2020   GLUCOSE 85 10/28/2020   BUN 12 10/28/2020   CREATININE 0.62 10/28/2020   CALCIUM 9.0 10/28/2020   GFRNONAA >60 10/28/2020   GFRAA 153 09/13/2019   Lipid Panel: No results found for: LDLCALC HgbA1c:  Lab Results  Component Value Date   HGBA1C 5.4 02/28/2019    MRI Brain, C spine and T spine w/wo  Ordered     ASSESSMENT   Kelly Benson is a 28 y.o. female x of MS who presents to the ED for continued dizziness and weakness. She states last week she had a MS flare and called Dr. Duncan office and she was given a 3 day course of steroids which ended on Wednesday that she states did not really help. She states in the past steroids had helped with her flare ups. She states today she was at work and was going to fill her water bottle up when she suddenly got very dizzy and weak all over. She states the dizziness is like being on a merry go round. Lately she has needed to hold onto things to walk and she has been very slow to walk  RECOMMENDATIONS  - Obtain MRI brain w/wo, MRI c spine and T spine w/wo  ______________________________________________________________________  Signed, Karna DELENA Geralds, NP [facility]  I have seen the patient reviewed the above note.  She presents with worsening of her underlying MS symptoms, unclear if true exacerbation or pseudo exacerbation.  I think finishing  a 5-day course of steroids would be reasonable, and I would favor PT/OT evaluation as well, which would require admission.  She does endorse significant psychosocial stress at home, and I do wonder if this could be playing a role in her current symptomatology.  In any case, I think finishing a 5-day course of steroids and PT evaluation would be prudent.  Aisha Seals, MD [facility]   If 7pm- 7am, please page neurology on call as listed in AMION.

## 2024-01-02 NOTE — ED Provider Notes (Signed)
 [place] EMERGENCY DEPARTMENT AT [facility] Provider Note   CSN: [id number] Arrival date & time: 01/02/24  1329     Patient presents with: MS Flareup   Kelly Benson is a 28 y.o. female.  She has multiple sclerosis and follows with Dr. Vear.  She has baseline problems with slowed cognition, blurry vision, general weakness and unsteady gait.  She had a flareup last week with worsening of her symptoms.  Was put on 3 days of high-dose prednisone .  She does not feel like it helped very much and today at work was so weak that she needed to call the ambulance.  No infectious symptoms, no fevers chills cough abdominal pain vomiting diarrhea or urinary symptoms.   The history is provided by the patient and a relative.  Weakness Severity:  Severe Onset quality:  Gradual Duration:  1 week Timing:  Constant Progression:  Unchanged Chronicity:  Recurrent Relieved by:  Nothing Worsened by:  Activity Ineffective treatments: prednisone . Associated symptoms: difficulty walking, dizziness and vision change   Associated symptoms: no abdominal pain, no aphasia, no chest pain, no dysuria, no fever and no shortness of breath        Prior to Admission medications   Medication Sig Start Date End Date Taking? Authorizing Provider  predniSONE  (DELTASONE ) 50 MG tablet 20 pills (1000 mg) po qd x 3days for MS exacerbation 12/25/23   Sater, Charlie LABOR, MD  sertraline  (ZOLOFT ) 50 MG tablet Take 1 tablet (50 mg total) by mouth daily. 12/25/23   Sater, Charlie LABOR, MD  VITAMIN D  PO Take 1 Dose by mouth daily.    [provider]  VITAMIN E PO Take 1 Dose by mouth daily.    [provider]  VUMERITY  231 MG CPDR TAKE 1 CAPSULE (231MG ) BY MOUTH TWICE DAILY FOR THE FIRST 7 DAYS, THEN TAKE 2 CAPSULES (462MG ) TWICE DAILY THEREAFTER 10/03/22   Sater, Charlie LABOR, MD    Allergies: Patient has no known allergies.    Review of Systems  Constitutional:  Positive for fatigue. Negative for  fever.  Eyes:  Positive for visual disturbance.  Respiratory:  Negative for shortness of breath.   Cardiovascular:  Negative for chest pain.  Gastrointestinal:  Negative for abdominal pain.  Genitourinary:  Negative for dysuria.  Neurological:  Positive for dizziness and weakness.    Updated Vital Signs BP 139/85 (BP Location: Right Arm)   Pulse 89   Temp 98.5 F (36.9 C) (Oral)   Resp 20   SpO2 100%   Physical Exam Vitals and nursing note reviewed.  Constitutional:      General: She is not in acute distress.    Appearance: Normal appearance. She is well-developed.  HENT:     Head: Normocephalic and atraumatic.  Eyes:     Conjunctiva/sclera: Conjunctivae normal.  Cardiovascular:     Rate and Rhythm: Normal rate and regular rhythm.     Heart sounds: No murmur heard. Pulmonary:     Effort: Pulmonary effort is normal. No respiratory distress.     Breath sounds: Normal breath sounds. No stridor. No wheezing.  Abdominal:     Palpations: Abdomen is soft.     Tenderness: There is no abdominal tenderness. There is no guarding or rebound.  Musculoskeletal:        General: No tenderness or deformity. Normal range of motion.     Cervical back: Neck supple.  Skin:    General: Skin is warm and dry.  Neurological:  General: No focal deficit present.     Mental Status: She is alert.     GCS: GCS eye subscore is 4. GCS verbal subscore is 5. GCS motor subscore is 6.     Cranial Nerves: No cranial nerve deficit.     Motor: Weakness present.     Comments: She has 4 out of 5 upper extremity strength and lower extremity strength.  Symmetric but globally down.     (all labs ordered are listed, but only abnormal results are displayed) Labs Reviewed  BASIC METABOLIC PANEL WITH GFR - Abnormal; Notable for the following components:      Result Value   Potassium 3.3 (*)    Glucose, Bld 105 (*)    Calcium 8.6 (*)    All other components within normal limits  CBC WITH  DIFFERENTIAL/PLATELET - Abnormal; Notable for the following components:   RBC 5.32 (*)    MCH 25.9 (*)    All other components within normal limits  URINALYSIS, ROUTINE W REFLEX MICROSCOPIC - Abnormal; Notable for the following components:   APPearance HAZY (*)    All other components within normal limits  CBG MONITORING, ED - Abnormal; Notable for the following components:   Glucose-Capillary 108 (*)    All other components within normal limits  HCG, SERUM, QUALITATIVE  MAGNESIUM  VITAMIN B12    EKG: EKG Interpretation Date/Time:  Friday January 02 2024 13:36:07 EDT Ventricular Rate:  86 PR Interval:  138 QRS Duration:  81 QT Interval:  341 QTC Calculation: 408 R Axis:   67  Text Interpretation: Sinus rhythm No significant change since prior 6/22 Confirmed by Towana Sharper [phone]) on 01/02/2024 1:43:44 PM  Radiology: No results found.   Procedures   Medications Ordered in the ED - No data to display  Clinical Course as of 01/02/24 1822  Fri Jan 02, 2024  1407 Consulted and reviewed case with neurology Dr. Michaela.  He is recommending getting MRI with and without contrast of her brain cervical and thoracic spine. [MB]    Clinical Course User Index [MB] Towana Sharper BROCKS, MD                                 Medical Decision Making Amount and/or Complexity of Data Reviewed Labs: ordered. Radiology: ordered.  Risk Prescription drug management.   This patient complains of worsening of general weakness blurry vision MS; this involves an extensive number of treatment Options and is a complaint that carries with it a high risk of complications and morbidity. The differential includes MS exacerbation, infection, metabolic derangement  I ordered, reviewed and interpreted labs, which included CBC unremarkable, chemistries with low potassium mildly elevated glucose, urinalysis without signs of infection I ordered imaging studies which included MRI with and without of  brain cervical spine thoracic spine and these are pending at time of signout Additional history obtained from patient's grandmother Previous records obtained and reviewed in epic including recent neurology notes I consulted neurology Dr. Michaela and discussed lab and imaging findings and discussed disposition.  Cardiac monitoring reviewed, sinus rhythm Social determinants considered, no significant barriers Critical Interventions: None  After the interventions stated above, I reevaluated the patient and found patient still to be symptomatic and will likely need admission Admission and further testing considered, patient's care signed out to Dr. Zackowski to follow-up on results of MRI and review with neurology.  Anticipate will probably need admission possibly  with IV steroids.  Ultimately disposition depends on neurology recommendations.      Final diagnoses:  Multiple sclerosis exacerbation [facility])    ED Discharge Orders     None          Towana Ozell BROCKS, MD 01/02/24 [phone]

## 2024-01-02 NOTE — ED Provider Notes (Signed)
 Patient seen by Dr. Michaela from neurohospitalist.  Recommended admission for her.  But she was wanting to go home but she changed her mind she will be admitted.  They are saying it would be a hospitalist admission for high-dose steroids.  He did review her MRIs he did not see anything significant.  He did not specify the inpatient steroids however.   Onaje Warne, MD 01/02/24 [phone]

## 2024-01-02 NOTE — ED Notes (Signed)
 Patient transported to MRI

## 2024-01-02 NOTE — ED Notes (Signed)
 Got patient into a gown on the monitor did EKG shown to Dr Towana patient is resting with call bell in reach and family at bedside

## 2024-01-02 NOTE — ED Triage Notes (Signed)
 Patient BIB EMS from work where she states she thinks she is having a flare up from her MS. Patient states that she recently had her first flare up in a few years and was taking prednisone  and the last dose for that was Wednesday. Patient states that she feels she never went back to her baseline and feels like the prednisone  made it worse. Patient was at work today and became extremely dizzy almost passing out. Patient states that she also has some pulls on the left side which is normal for her flare ups, but it is worse than usual.

## 2024-01-03 LAB — VITAMIN B12: Vitamin B-12: 350 pg/mL (ref 180–914)

## 2024-01-03 NOTE — Progress Notes (Signed)
  Progress Note   Patient: Kelly Benson FMW:[id number] DOB: 05/04/1996 DOA: 01/02/2024     0 DOS: the patient was seen and examined on 01/03/2024        Brief hospital course: 28 y.o. F with MS presented with increased ataxia despite outpatient high dose prednisone .  Neurology recommended admission for MRI brain and IV Solumedrol.     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   - Solumedrol - Consult Neurology, appreciate cares  Hypokalemia - Repeat BMP      Subjective: Feeling not much different.  Still with ataxia, but no new numbness, weakness, vision changes.     Physical Exam: BP 139/78 (BP Location: Right Arm)   Pulse 84   Temp 98.9 F (37.2 C) (Oral)   Resp 16   SpO2 98%   Adult female, lying in bed, interactive and appropriate RRR, no murmurs, no peripheral edema Respiratory rate normal, lungs clear without rales or wheezes Abdomen soft no tenderness palpation or guarding, no ascites or distention gait not tested, speech is fluent, oriented x 3, face symmetric     Data Reviewed: Basic metabolic panel shows hypokalemia    Family Communication:      Disposition: Status is: Inpatient         Author: Lonni SHAUNNA Dalton, MD 01/03/2024 7:29 PM  For on call review [email or web address].

## 2024-01-03 NOTE — Care Management (Signed)
 With approval from J Scinto LCSW, on call supervisor I set up taxi to pick up spouse at home to bring in medications that are non formulary and prescribed. Taxi voucher for his return home left in room, nurse aware.  Voucher to be mailed to [place], left on CMA Iris's desk per instructions from [employer].

## 2024-01-03 NOTE — Evaluation (Signed)
 Occupational Therapy Evaluation Patient Details Name: Kelly Benson MRN: [id number] DOB: August 31, 1995 Today's Date: 01/03/2024   History of Present Illness   Pt is a 28 y.o. female admitted 8/22 with MS flareup. PMH: MS, scoliosis     Clinical Impressions Pt currently at min assist level for selfcare tasks and functional transfers without use of an assistive device.  Decreased BUE strength and coordination noted as well as increased fatigue and decreased dynamic standing balance noted with functional tasks.  Feel she will benefit from acute care OT at this time to help increase balance, safety, coordination, and strength so she can return home as independent as possible with her spouse.  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  Recommend post acute outpatient OT at this time, however if pt unable to get reliable transportation, recommend HHOT.  Will update depending on progress.     If plan is discharge home, recommend the following:   A little help with walking and/or transfers;A little help with bathing/dressing/bathroom;Assist for transportation;Assistance with cooking/housework;Help with stairs or ramp for entrance     Functional Status Assessment   Patient has had a recent decline in their functional status and demonstrates the ability to make significant improvements in function in a reasonable and predictable amount of time.     Equipment Recommendations   Other (comment) (Pt to look at tub seat/bench from outside source)      Precautions/Restrictions   Precautions Precautions: Fall Recall of Precautions/Restrictions: Intact Restrictions Weight Bearing Restrictions Per Provider Order: No     Mobility Bed Mobility Overal bed mobility: Modified Independent             General bed mobility comments: increased time    Transfers Overall transfer level: Needs assistance Equipment used: None Transfers: Sit to/from Stand, Bed  to chair/wheelchair/BSC Sit to Stand: Min assist     Step pivot transfers: Min assist     General transfer comment: Min assist for standing balance and transfers in the room without assistive device.      Balance Overall balance assessment: Needs assistance Sitting-balance support: Feet supported, No upper extremity supported Sitting balance-Leahy Scale: Good     Standing balance support: During functional activity Standing balance-Leahy Scale: Poor Standing balance comment: Pt needs UE support during mobility                           ADL either performed or assessed with clinical judgement   ADL Overall ADL's : Needs assistance/impaired Eating/Feeding: Modified independent;Sitting   Grooming: Wash/dry hands;Minimal assistance;Standing   Upper Body Bathing: Supervision/ safety;Sitting   Lower Body Bathing: Minimal assistance;Sit to/from stand   Upper Body Dressing : Supervision/safety;Sitting   Lower Body Dressing: Minimal assistance;Sit to/from stand   Toilet Transfer: Minimal assistance;Ambulation   Toileting- Clothing Manipulation and Hygiene: Minimal assistance;Sit to/from stand       Functional mobility during ADLs: Minimal assistance (ambulation without assistive device) General ADL Comments: Pt reports that her arms and legs feel different like they are not hers.  Decreased coordination when reaching to open bottle top placed at eye level reaching out.  Increased LOB to the right with standing and mobility.  Discussed need for shower bench at home in order to decrease risk of falling when attempting to get into and out of the shower/tub.  She agrees and will look to purchase from [employer].  Issued red medium resistance therapy putty with 3 flexion  exercises shown to complete daily.  Will add more with further visits.     Vision Baseline Vision/History: 1 Wears glasses Patient Visual Report: Blurring of vision Vision Assessment?: Yes Eye Alignment:  Within Functional Limits Ocular Range of Motion: Within Functional Limits Alignment/Gaze Preference: Within Defined Limits Tracking/Visual Pursuits: Able to track stimulus in all quads without difficulty Convergence: Within functional limits Visual Fields: No apparent deficits     Perception Perception: Not tested       Praxis Praxis: Not tested       Pertinent Vitals/Pain Pain Assessment Pain Assessment: 0-10 Pain Score: 0-No pain     Extremity/Trunk Assessment Upper Extremity Assessment Upper Extremity Assessment: Generalized weakness (shoulder flexion strength bilaterally 3+/5 elbow flexion 4/5, elbow extension 3+/5 and grip 3+/5.  Decreased gross coordination and control noted with finger to nose and when reaching to remove cap from the soap.)   Lower Extremity Assessment Lower Extremity Assessment: Defer to PT evaluation   Cervical / Trunk Assessment Cervical / Trunk Assessment: Normal   Communication Communication Communication: No apparent difficulties   Cognition Arousal: Alert Behavior During Therapy: [facility] for tasks assessed/performed                                 Following commands: Intact                  Home Living Family/patient expects to be discharged to:: Private residence Living Arrangements: Spouse/significant other Available Help at Discharge: Family;Available PRN/intermittently Type of Home: Apartment Home Access: Stairs to enter Entrance Stairs-Number of Steps: down 6-7 steps Entrance Stairs-Rails: Right Home Layout: One level     Bathroom Shower/Tub: Tub/shower unit;Curtain   Firefighter: Standard Bathroom Accessibility: Yes   Home Equipment: Cane - single point;Rollator (4 wheels);Grab bars - tub/shower   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      Prior Functioning/Environment Prior Level of Function : Independent/Modified Independent;Driving                    OT Problem List:  Decreased strength;Decreased activity tolerance;Impaired balance (sitting and/or standing);Impaired UE functional use;Decreased coordination;Decreased knowledge of use of DME or AE   OT Treatment/Interventions: Self-care/ADL training;Patient/family education;Balance training;Therapeutic activities;DME and/or AE instruction;Neuromuscular education;Therapeutic exercise      OT Goals(Current goals can be found in the care plan section)   Acute Rehab OT Goals Patient Stated Goal: Pt wants to get back to her baseline level of independence OT Goal Formulation: With patient Time For Goal Achievement: 01/17/24 Potential to Achieve Goals: Good   OT Frequency:  Min 2X/week       AM-PAC OT 6 Clicks Daily Activity     Outcome Measure Help from another person eating meals?: None Help from another person taking care of personal grooming?: A Little Help from another person toileting, which includes using toliet, bedpan, or urinal?: A Little Help from another person bathing (including washing, rinsing, drying)?: A Little Help from another person to put on and taking off regular upper body clothing?: A Little Help from another person to put on and taking off regular lower body clothing?: A Little 6 Click Score: 19   End of Session Equipment Utilized During Treatment: Gait belt Nurse Communication: Mobility status  Activity Tolerance: Patient tolerated treatment well Patient left: in chair;with call bell/phone within reach;with chair alarm set  OT Visit Diagnosis: Unsteadiness on feet (R26.81);Other abnormalities of gait and mobility (R26.89);Repeated  falls (R29.6);Muscle weakness (generalized) (M62.81)                Time: 8798-8745 OT Time Calculation (min): 53 min Charges:  OT General Charges $OT Visit: 1 Visit OT Evaluation $OT Eval Moderate Complexity: 1 Mod OT Treatments $Self Care/Home Management : 23-37 mins $Therapeutic Activity: 8-22 mins  Lynwood Constant, OTR/L Acute  Rehabilitation Services  Office [phone] 01/03/2024

## 2024-01-03 NOTE — Progress Notes (Signed)
 NEUROLOGY CONSULT FOLLOW UP NOTE   Date of service: January 03, 2024 Patient Name: Kelly Benson MRN:  [id number] DOB:  1996-04-30  Interval Hx/subjective   No family at the bedside.  RN is at the bedside. Patient seems to be in better spirits today and says she slept well.  She tolerated the high-dose steroids No new neurological events overnight She endorses being under a lot of stress things at home.   Vitals   Vitals:   01/02/24 1857 01/02/24 2300 01/03/24 0500 01/03/24 0745  BP: 125/78 123/79 96/83 125/84  Pulse: 73 73 89 90  Resp: 18 16 16    Temp: 98.3 F (36.8 C) 98.4 F (36.9 C) 98 F (36.7 C) 98 F (36.7 C)  TempSrc: Oral Oral Oral   SpO2: 100% 98% 97% 96%     There is no height or weight on file to calculate BMI.  Physical Exam   Constitutional: Appears well-developed and well-nourished.   Psych: Affect appropriate to situation.   Eyes: No scleral injection.   HENT: No OP obstrucion.   Head: Normocephalic.   Cardiovascular: Normal rate and regular rhythm.   Respiratory: Effort normal, non-labored breathing.   GI: Soft.  No distension. There is no tenderness.   Skin: WDI.    Neurologic Examination    Mental Status -  Level of arousal and orientation to time, place, and person were intact . Language including expression, naming, repetition, comprehension was assessed and found intact . Attention span and concentration were normal . Recent and remote memory were intact . Fund of Knowledge was assessed and was intact .   Cranial Nerves II - XII - II - Visual field intact OU . III, IV, VI - Extraocular movements intact . V - Facial sensation intact bilaterally . VII - Facial movement intact bilaterally . VIII - Hearing & vestibular intact bilaterally . X - Palate elevates symmetrically . XI - Chin turning & shoulder shrug intact bilaterally . XII - Tongue protrusion intact .   Motor Strength - The patient's strength was normal in bilateral uppers  and right lower 4+/5 with  mild drift and left leg 4-/5 with minimal drift, Weak dorsiflexion and plantar flexion on left compared to the right Bulk was normal and fasciculations were absent .   Motor Tone - Muscle tone was assessed at the neck and appendages and was normal . Reflexes - The patient's reflexes were symmetrical in all extremities and she had no pathological reflexes . Sensory - Light touch, temperature/pinprick were assessed and were symmetrical .   Coordination - The patient had normal movements in the hands and feet with no ataxia or dysmetria.  Tremor was absent. Gait and Station - deferred.  Medications  Current Facility-Administered Medications:    acetaminophen  (TYLENOL ) tablet 650 mg, 650 mg, Oral, Q6H PRN **OR** acetaminophen  (TYLENOL ) suppository 650 mg, 650 mg, Rectal, Q6H PRN, Bryn Bernardino NOVAK, MD   cyanocobalamin  (VITAMIN B12) tablet 1,000 mcg, 1,000 mcg, Oral, Daily, Grunz, Ryan B, MD, 1,000 mcg at 01/03/24 9152   Diroximel Fumarate  CPDR 462 mg, 462 mg, Oral, BID, Bryn Bernardino NOVAK, MD   enoxaparin  (LOVENOX ) injection 40 mg, 40 mg, Subcutaneous, Q24H, Bryn, Ryan B, MD, 40 mg at 01/02/24 2340   folic acid  (FOLVITE ) tablet 1 mg, 1 mg, Oral, Daily, Bryn Bernardino B, MD, 1 mg at 01/03/24 0847   methylPREDNISolone  sodium succinate (SOLU-MEDROL ) 1,000 mg in sodium chloride  0.9 % 50 mL IVPB, 1,000 mg, Intravenous, Q24H, Bryn Bernardino B,  MD, Last Rate: 66 mL/hr at 01/02/24 2007, 1,000 mg at 01/02/24 2007   ondansetron  (ZOFRAN ) tablet 4 mg, 4 mg, Oral, Q6H PRN **OR** ondansetron  (ZOFRAN ) injection 4 mg, 4 mg, Intravenous, Q6H PRN, Bryn Bernardino NOVAK, MD   sertraline  (ZOLOFT ) tablet 50 mg, 50 mg, Oral, Daily, Bryn Bernardino B, MD, 50 mg at 01/03/24 0847   sodium chloride  flush (NS) 0.9 % injection 3 mL, 3 mL, Intravenous, Q12H, Bryn Bernardino NOVAK, MD, 3 mL at 01/03/24 0857  Labs and Diagnostic Imaging   CBC:  Recent Labs  Lab 01/02/24 1339  WBC 8.9  NEUTROABS 5.3  HGB 13.8  HCT 43.9  MCV 82.5   PLT 310    Basic Metabolic Panel:  Lab Results  Component Value Date   NA 142 01/02/2024   K 3.3 (L) 01/02/2024   CO2 26 01/02/2024   GLUCOSE 105 (H) 01/02/2024   BUN 16 01/02/2024   CREATININE 0.57 01/02/2024   CALCIUM 8.6 (L) 01/02/2024   GFRNONAA >60 01/02/2024   GFRAA 153 09/13/2019   Lipid Panel: No results found for: LDLCALC HgbA1c:  Lab Results  Component Value Date   HGBA1C 5.4 02/28/2019   Urine Drug Screen: No results found for: LABOPIA, COCAINSCRNUR, LABBENZ, AMPHETMU, THCU, LABBARB  Alcohol Level No results found for: ETH INR No results found for: INR APTT No results found for: APTT AED levels: No results found for: PHENYTOIN, ZONISAMIDE, LAMOTRIGINE, LEVETIRACETA  MRI Brain, C spine and T spine (Personally reviewed): 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 No evidence of active demyelination.    Assessment   Kelly Benson is a 28 y.o. female  hx of MS who presents to the ED for continued dizziness and weakness. She states last week she had a MS flare and called Dr. Duncan office and she was given a 3 day course of steroids which ended on Wednesday that she states did not really help. She states in the past steroids had helped with her flare ups. She states today she was at work and was going to fill her water bottle up when she suddenly got very dizzy and weak all over. She states the dizziness is like being on a merry go round. Lately she has needed to hold onto things to walk and she has been very slow to walk   Recommendations  Today will be 2nd dose of high dose solumedrol for a total of 5 day course.  PPI while on steroids Monitor Blood glucose while on steroids  PT/OT  Outpatient follow up with Dr. Vear with [facility]  ______________________________________________________________________   Signed, Karna DELENA Geralds, NP [facility]

## 2024-01-03 NOTE — Evaluation (Signed)
 Physical Therapy Evaluation Patient Details Name: Kelly Benson MRN: [id number] DOB: December 19, 1995 Today's Date: 01/03/2024  History of Present Illness  Pt is a 28 y.o. female admitted 8/22 with MS flareup. PMH: MS, scoliosis  Clinical Impression  Pt admitted with above diagnosis. PTA pt lived at home with her husband, independent mobility/ADLs but reports recent falls. Pt currently with functional limitations due to the deficits listed below (see PT Problem List). On eval, pt demo mod I bed mobility. CGA transfers, and min/HHA amb 30'. Ataxic gait pattern with poor standing balance. Recommending use of rollator at home at this time. Pt will benefit from acute skilled PT to increase their independence and safety with mobility to allow discharge. Post acute pt would benefit from neuro OPPT. If pt does not have transportation, she will need HHPT.          If plan is discharge home, recommend the following: A little help with walking and/or transfers;A little help with bathing/dressing/bathroom;Help with stairs or ramp for entrance;Assist for transportation   Can travel by private vehicle        Equipment Recommendations None recommended by PT  Recommendations for Other Services       Functional Status Assessment Patient has had a recent decline in their functional status and demonstrates the ability to make significant improvements in function in a reasonable and predictable amount of time.     Precautions / Restrictions Precautions Precautions: Fall Recall of Precautions/Restrictions: Intact      Mobility  Bed Mobility Overal bed mobility: Modified Independent             General bed mobility comments: increased time    Transfers Overall transfer level: Needs assistance Equipment used: None Transfers: Sit to/from Stand Sit to Stand: Contact guard assist                Ambulation/Gait Ambulation/Gait assistance: Min assist Gait Distance (Feet): 30  Feet Assistive device: 1 person hand held assist Gait Pattern/deviations: Step-through pattern, Ataxic, Decreased stride length, Drifts right/left Gait velocity: slow, guarded Gait velocity interpretation: <1.8 ft/sec, indicate of risk for recurrent falls   General Gait Details: Pt states I don't feel like I have control over my own legs.  Stairs            Wheelchair Mobility     Tilt Bed    Modified Rankin (Stroke Patients Only)       Balance Overall balance assessment: Needs assistance Sitting-balance support: Feet supported, No upper extremity supported Sitting balance-Leahy Scale: Good     Standing balance support: During functional activity Standing balance-Leahy Scale: Poor Standing balance comment: reliant on external assist                             Pertinent Vitals/Pain Pain Assessment Pain Assessment: No/denies pain    Home Living Family/patient expects to be discharged to:: Private residence Living Arrangements: Spouse/significant other Available Help at Discharge: Family;Available PRN/intermittently Type of Home: Apartment Home Access: Stairs to enter Entrance Stairs-Rails: Right Entrance Stairs-Number of Steps: down 6-7 steps   Home Layout: One level Home Equipment: Cane - single point;Rollator (4 wheels);Grab bars - tub/shower 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    Prior Function Prior Level of Function : Independent/Modified Independent;Driving                     Extremity/Trunk Assessment   Upper Extremity Assessment Upper Extremity Assessment:  Defer to OT evaluation    Lower Extremity Assessment Lower Extremity Assessment: Generalized weakness       Communication   Communication Communication: No apparent difficulties    Cognition Arousal: Alert Behavior During Therapy: WFL for tasks assessed/performed   PT - Cognitive impairments: No apparent impairments                          Following commands: Intact       Cueing Cueing Techniques: Verbal cues     General Comments      Exercises     Assessment/Plan    PT Assessment Patient needs continued PT services  PT Problem List Decreased strength;Decreased coordination;Decreased activity tolerance;Decreased balance;Decreased mobility       PT Treatment Interventions DME instruction;Therapeutic exercise;Gait training;Balance training;Stair training;Functional mobility training;Therapeutic activities;Patient/family education    PT Goals (Current goals can be found in the Care Plan section)  Acute Rehab PT Goals Patient Stated Goal: return to work PT Goal Formulation: With patient Time For Goal Achievement: 01/17/24 Potential to Achieve Goals: Good    Frequency Min 2X/week     Co-evaluation               AM-PAC PT 6 Clicks Mobility  Outcome Measure Help needed turning from your back to your side while in a flat bed without using bedrails?: None Help needed moving from lying on your back to sitting on the side of a flat bed without using bedrails?: None Help needed moving to and from a bed to a chair (including a wheelchair)?: A Little Help needed standing up from a chair using your arms (e.g., wheelchair or bedside chair)?: A Little Help needed to walk in hospital room?: A Little Help needed climbing 3-5 steps with a railing? : A Lot 6 Click Score: 19    End of Session Equipment Utilized During Treatment: Gait belt Activity Tolerance: Patient tolerated treatment well Patient left: in bed;with call bell/phone within reach Nurse Communication: Mobility status PT Visit Diagnosis: Ataxic gait (R26.0);Muscle weakness (generalized) (M62.81)    Time: 8878-8861 PT Time Calculation (min) (ACUTE ONLY): 17 min   Charges:   PT Evaluation $PT Eval Moderate Complexity: 1 Mod   PT General Charges $$ ACUTE PT VISIT: 1 Visit         Sari MATSU., PT  Office # [phone]   Kelly Benson 01/03/2024, 1:34 PM

## 2024-01-03 NOTE — Hospital Course (Signed)
 28 y.o. F with MS presented with increased ataxia despite outpatient high dose prednisone .  Neurology recommended admission for MRI brain and IV Solumedrol.

## 2024-01-03 NOTE — Plan of Care (Signed)
   Problem: Education: Goal: Knowledge of General Education information will improve Description Including pain rating scale, medication(s)/side effects and non-pharmacologic comfort measures Outcome: Progressing   Problem: Health Behavior/Discharge Planning: Goal: Ability to manage health-related needs will improve Outcome: Progressing

## 2024-01-04 NOTE — Progress Notes (Signed)
 Physical Therapy Treatment Patient Details Name: Kelly Benson MRN: [id number] DOB: 02-Feb-1996 Today's Date: 01/04/2024   History of Present Illness Pt is a 28 y.o. female admitted 8/22 with MS flareup. PMH: MS, scoliosis    PT Comments  Continuing work on functional mobility and activity tolerance;  Session focused on progressive ambulation with rollator, with good use of rollator for UE support,a dn as a way to safely sit when needed; showed her how to use rollator, including brakes and stand<>sit; noteworthy that after about 60 feet walking in hallway, pt had onset of significant fatigue, appeared almost pre-syncopal, opted to sit to the rollator and push back to her room; 2 person mod assist to help her back to bed; Did not have the opportunity to practice stairs;   Discussed with Luke, RN and Dr. Jonel;   Tough situation for DC; While getting to Outpt Neuro Physical Therapy is the optimal follow up, transportation will be an issue for Jazmin; will plan to give her an HEP in the next one or 2 sessions     01/04/24 0856  Vital Signs  Pulse Rate 96  Pulse Rate Source Monitor  BP 135/86  BP Location Left Arm  BP Method Automatic  Patient Position (if appropriate) Sitting  Oxygen Therapy  SpO2 99 %      If plan is discharge home, recommend the following: A little help with walking and/or transfers;A little help with bathing/dressing/bathroom;Help with stairs or ramp for entrance;Assist for transportation   Can travel by private vehicle        Equipment Recommendations  Rollator (4 wheels)    Recommendations for Other Services       Precautions / Restrictions Precautions Precautions: Fall Recall of Precautions/Restrictions: Intact Restrictions Weight Bearing Restrictions Per Provider Order: No     Mobility  Bed Mobility Overal bed mobility: Modified Independent             General bed mobility comments: increased time    Transfers Overall transfer  level: Needs assistance Equipment used: Rollator (4 wheels) Transfers: Sit to/from Stand Sit to Stand: Min assist, Mod assist           General transfer comment: Min assist to steady; light  Mod assist to stand from Rollator once fatigue sets in    Ambulation/Gait Ambulation/Gait assistance: Contact guard assist, Min assist Gait Distance (Feet): 65 Feet Assistive device: Rollator (4 wheels) Gait Pattern/deviations: Step-through pattern, Ataxic, Decreased stride length, Drifts right/left Gait velocity: slow, guarded (especially with onset of fatigue)     General Gait Details: initially with more coordinated steps; as we walked past the nurses' station, onset of facial twitching, and less coordinated steps with more R/L drift; sat to rollator, and we decided together that stairs not a good idea at this time; rolled back to room with pt seated on rollator seat   Stairs             Wheelchair Mobility     Tilt Bed    Modified Rankin (Stroke Patients Only)       Balance     Sitting balance-Leahy Scale: Good       Standing balance-Leahy Scale: Poor Standing balance comment: Pt needs UE support during mobility                            Communication Communication Communication: No apparent difficulties  Cognition Arousal: Alert Behavior During Therapy: [facility] for tasks assessed/performed  PT - Cognitive impairments: Safety/Judgement                       PT - Cognition Comments: Pt stating she can keep going when she seems to be pre-syncopal by observation Following commands: Intact      Cueing Cueing Techniques: Verbal cues  Exercises      General Comments General comments (skin integrity, edema, etc.): Pt's husband Will present and helpful      Pertinent Vitals/Pain Pain Assessment Pain Assessment: Faces Faces Pain Scale: Hurts little more Pain Location: Bright hallway is bothering pt Pain Descriptors / Indicators:  Discomfort Pain Intervention(s): Monitored during session    Home Living Family/patient expects to be discharged to:: Private residence Living Arrangements: Spouse/significant other                      Prior Function            PT Goals (current goals can now be found in the care plan section) Acute Rehab PT Goals Patient Stated Goal: return to work PT Goal Formulation: With patient Time For Goal Achievement: 01/17/24 Potential to Achieve Goals: Good Progress towards PT goals: Progressing toward goals    Frequency    Min 2X/week      PT Plan      Co-evaluation              AM-PAC PT 6 Clicks Mobility   Outcome Measure  Help needed turning from your back to your side while in a flat bed without using bedrails?: None Help needed moving from lying on your back to sitting on the side of a flat bed without using bedrails?: None Help needed moving to and from a bed to a chair (including a wheelchair)?: A Little Help needed standing up from a chair using your arms (e.g., wheelchair or bedside chair)?: A Little Help needed to walk in hospital room?: A Little Help needed climbing 3-5 steps with a railing? : A Lot 6 Click Score: 19    End of Session Equipment Utilized During Treatment: Gait belt Activity Tolerance: Patient tolerated treatment well;Other (comment) (though extremely fatigued after hallway amb) Patient left: in bed;with call bell/phone within reach Nurse Communication: Mobility status PT Visit Diagnosis: Ataxic gait (R26.0);Muscle weakness (generalized) (M62.81);Other abnormalities of gait and mobility (R26.89);Other symptoms and signs involving the nervous system (R29.898)     Time: 0830-0900 PT Time Calculation (min) (ACUTE ONLY): 30 min  Charges:    $Gait Training: 8-22 mins $Therapeutic Activity: 8-22 mins PT General Charges $$ ACUTE PT VISIT: 1 Visit                     Silvano Currier, PT  Acute Rehabilitation Services Office  [phone] Secure Chat welcomed    Silvano VEAR Currier 01/04/2024, 9:45 AM

## 2024-01-04 NOTE — Progress Notes (Signed)
 NEUROLOGY CONSULT FOLLOW UP NOTE   Date of service: January 04, 2024 Patient Name: Declan N Parekh MRN:  [id number] DOB:  1995-12-17  Interval Hx/subjective   Husband is at the bedside. Patient is sitting up in bed in NAD. She states she feels very good and feels her strength has improved greatly. She has been able to get up and walk around.  Had last dose of high dose steroids yesterday and tolerated well.   Vitals   Vitals:   01/03/24 1635 01/03/24 1920 01/04/24 0422 01/04/24 0741  BP: 128/74 139/78 121/74 113/66  Pulse: 90 84 82 82  Resp:  16 16   Temp: 98.2 F (36.8 C) 98.9 F (37.2 C) 98.9 F (37.2 C) 98 F (36.7 C)  TempSrc: Oral Oral Oral Oral  SpO2: 94% 98% 97% 96%     There is no height or weight on file to calculate BMI.  Physical Exam   Constitutional: Appears well-developed and well-nourished.   Psych: Affect appropriate to situation.   Eyes: No scleral injection.   HENT: No OP obstrucion.   Head: Normocephalic.   Cardiovascular: Normal rate and regular rhythm.   Respiratory: Effort normal, non-labored breathing.   GI: Soft.  No distension. There is no tenderness.   Skin: WDI.    Neurologic Examination    Mental Status -  Level of arousal and orientation to time, place, and person were intact . Language including expression, naming, repetition, comprehension was assessed and found intact . Attention span and concentration were normal . Recent and remote memory were intact . Fund of Knowledge was assessed and was intact .   Cranial Nerves II - XII - II - Visual field intact OU . III, IV, VI - Extraocular movements intact . V - Facial sensation intact bilaterally . VII - Facial movement intact bilaterally . VIII - Hearing & vestibular intact bilaterally . X - Palate elevates symmetrically . XI - Chin turning & shoulder shrug intact bilaterally . XII - Tongue protrusion intact .   Motor Strength - The patient's strength was normal in bilateral  uppers and right lower 4+/5 with  mild drift and left leg 4-/5 with minimal drift, Bulk was normal and fasciculations were absent .   Motor Tone - Muscle tone was assessed at the neck and appendages and was normal . Reflexes - The patient's reflexes were symmetrical in all extremities and she had no pathological reflexes . Sensory - Light touch, temperature/pinprick were assessed and were symmetrical .   Coordination - The patient had normal movements in the hands and feet with no ataxia or dysmetria.  Tremor was absent. Gait and Station - deferred.  Medications  Current Facility-Administered Medications:    acetaminophen  (TYLENOL ) tablet 650 mg, 650 mg, Oral, Q6H PRN **OR** acetaminophen  (TYLENOL ) suppository 650 mg, 650 mg, Rectal, Q6H PRN, Bryn Bernardino NOVAK, MD   cyanocobalamin  (VITAMIN B12) tablet 1,000 mcg, 1,000 mcg, Oral, Daily, Grunz, Ryan B, MD, 1,000 mcg at 01/04/24 9194   Diroximel Fumarate  CPDR 462 mg, 462 mg, Oral, BID, Bryn Bernardino NOVAK, MD   enoxaparin  (LOVENOX ) injection 40 mg, 40 mg, Subcutaneous, Q24H, Bryn Bernardino B, MD, 40 mg at 01/02/24 2340   folic acid  (FOLVITE ) tablet 1 mg, 1 mg, Oral, Daily, Bryn Bernardino B, MD, 1 mg at 01/04/24 0805   ondansetron  (ZOFRAN ) tablet 4 mg, 4 mg, Oral, Q6H PRN **OR** ondansetron  (ZOFRAN ) injection 4 mg, 4 mg, Intravenous, Q6H PRN, Bryn Bernardino NOVAK, MD   sertraline  (ZOLOFT ) tablet  50 mg, 50 mg, Oral, Daily, Bryn Bernardino NOVAK, MD, 50 mg at 01/04/24 0805   sodium chloride  flush (NS) 0.9 % injection 3 mL, 3 mL, Intravenous, Q12H, Bryn Bernardino NOVAK, MD, 3 mL at 01/03/24 2116  Labs and Diagnostic Imaging   CBC:  Recent Labs  Lab 01/02/24 1339  WBC 8.9  NEUTROABS 5.3  HGB 13.8  HCT 43.9  MCV 82.5  PLT 310    Basic Metabolic Panel:  Lab Results  Component Value Date   NA 142 01/02/2024   K 3.3 (L) 01/02/2024   CO2 26 01/02/2024   GLUCOSE 105 (H) 01/02/2024   BUN 16 01/02/2024   CREATININE 0.57 01/02/2024   CALCIUM 8.6 (L) 01/02/2024   GFRNONAA >60  01/02/2024   GFRAA 153 09/13/2019   Lipid Panel: No results found for: LDLCALC HgbA1c:  Lab Results  Component Value Date   HGBA1C 5.4 02/28/2019   Urine Drug Screen: No results found for: LABOPIA, COCAINSCRNUR, LABBENZ, AMPHETMU, THCU, LABBARB  Alcohol Level No results found for: ETH INR No results found for: INR APTT No results found for: APTT AED levels: No results found for: PHENYTOIN, ZONISAMIDE, LAMOTRIGINE, LEVETIRACETA  MRI Brain, C spine and T spine (Personally reviewed): 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 No evidence of active demyelination.    Assessment   Kaydra N Lamora is a 28 y.o. female  hx of MS who presents to the ED for continued dizziness and weakness. She states last week she had a MS flare and called Dr. Duncan office and she was given a 3 day course of steroids which ended on Wednesday that she states did not really help. She states in the past steroids had helped with her flare ups. She states today she was at work and was going to fill her water bottle up when she suddenly got very dizzy and weak all over. She states the dizziness is like being on a merry go round. Lately she has needed to hold onto things to walk and she has been very slow to walk   Recommendations  - completed total of 5 day course of high dose steroids  - PT/OT  - Outpatient follow up with Dr. Vear with [facility]  - Patient can be discharged home from neurology standpoint. Neurology will sign off. Please call with questions or concerns  ______________________________________________________________________   Signed, Karna DELENA Geralds, NP [facility]

## 2024-01-04 NOTE — Progress Notes (Signed)
  Progress Note   Patient: Kelly Benson FMW:[id number] DOB: 1995/09/27 DOA: 01/02/2024     1 DOS: the patient was seen and examined on 01/04/2024        Brief hospital course: 28 y.o. F with MS presented with increased ataxia despite outpatient high dose prednisone .  Neurology recommended admission for MRI brain and IV Solumedrol.     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   Completed Solumedrol, she is debilitated and unable to walk with PT today.  Cannot get HH. - PT/OT         Subjective: Initially tells me that she feels better.  But after working with physical therapy, she walked 50 feet, then collapsed into a wheelchair, could not stand again.  Because of her lack of insurance, we are not able to provide home health, wheelchair or walker.     Physical Exam: BP 135/86 (BP Location: Left Arm)   Pulse 96   Temp 98 F (36.7 C) (Oral)   Resp 16   SpO2 99%   While lying in bed, she appears to be open adult female, interactive and appropriate RRR, no murmurs, no peripheral edema Respiratory rate normal, lungs clear without rales or wheezes Oriented x 3, face symmetric, speech fluent, right upper extremity strength seems normal    Family Communication: Husband at the bedside    Disposition: Status is: Inpatient         Author: Lonni SHAUNNA Dalton, MD 01/04/2024 1:59 PM  For on call review [email or web address].

## 2024-01-04 NOTE — Progress Notes (Signed)
 Physical Therapy Treatment Patient Details Name: Kelly Benson MRN: [id number] DOB: [birth year] Today's Date: 01/04/2024   History of Present Illness Pt is a 28 y.o. female admitted 8/22 with MS flareup. PMH: MS, scoliosis    PT Comments  Continuing work on functional mobility and activity tolerance;  session focused on HEP education to bolster Kelly Benson with options for exercising/stretching at home; This PT presented a few exercises for HEP including sit, stand, and walk between 2 chairs, single limb stance; single limb stance with other leg tapping at 3, 6, and 9 oclock; chest opening stretches, modified dips;   The main difficulty Kelly Benson verbalized was feeling dizzy with ambulation, and feeling like she may fall; Took a set of orthostatic vitals, and was able to rule out BP drop as a source of dizziness; performed VOR x1 therex with some noted difficulty; Discussed and demonstrated gaze stabilization activities and accomodations; Will request a trained vestibular PT to work with her tomorrow;   For both MS and vestibular dysfunction, the best option for follow up is Outpt NeuroPT; if this is unable to work for Kelly Benson will work to get her a workable HEP; I also wonder if there is a support group that pt could perhaps connect with virtually for more support.   If plan is discharge home, recommend the following: A little help with walking and/or transfers;A little help with bathing/dressing/bathroom;Help with stairs or ramp for entrance;Assist for transportation   Can travel by private vehicle        Equipment Recommendations  Rollator (4 wheels)    Recommendations for Other Services       Precautions / Restrictions Precautions Precautions: Fall Recall of Precautions/Restrictions: Intact Restrictions Weight Bearing Restrictions Per Provider Order: No     Mobility  Bed Mobility Overal bed mobility: Modified Independent             General bed mobility comments: increased  time    Transfers Overall transfer level: Needs assistance Equipment used: Rollator (4 wheels) Transfers: Sit to/from Stand Sit to Stand: Contact guard assist           General transfer comment: CGA to steady    Ambulation/Gait                   Stairs             Wheelchair Mobility     Tilt Bed    Modified Rankin (Stroke Patients Only)       Balance     Sitting balance-Leahy Scale: Good       Standing balance-Leahy Scale: Poor                              Communication Communication Communication: No apparent difficulties  Cognition Arousal: Alert Behavior During Therapy: WFL for tasks assessed/performed   PT - Cognitive impairments: No apparent impairments                         Following commands: Intact      Cueing Cueing Techniques: Verbal cues  Exercises Other Exercises Other Exercises: This PT presented a few exercises for HEP including sit, stand, and walk between 2 chairs, single limb stance; single limb stance with other leg tapping at 3, 6, and 9 oclock; chest opening stretches, modified dips    General Comments General comments (skin integrity, edema, etc.): Pt's husband Will present and helpful  Pertinent Vitals/Pain Pain Assessment Pain Assessment: Faces Faces Pain Scale: No hurt Pain Intervention(s): Monitored during session    Home Living                          Prior Function            PT Goals (current goals can now be found in the care plan section) Acute Rehab PT Goals Patient Stated Goal: return to work PT Goal Formulation: With patient Time For Goal Achievement: 01/17/24 Potential to Achieve Goals: Good Progress towards PT goals: Progressing toward goals    Frequency    Min 2X/week      PT Plan      Co-evaluation              AM-PAC PT 6 Clicks Mobility   Outcome Measure  Help needed turning from your back to your side while in a flat  bed without using bedrails?: None Help needed moving from lying on your back to sitting on the side of a flat bed without using bedrails?: None Help needed moving to and from a bed to a chair (including a wheelchair)?: A Little Help needed standing up from a chair using your arms (e.g., wheelchair or bedside chair)?: A Little Help needed to walk in hospital room?: A Little Help needed climbing 3-5 steps with a railing? : A Lot 6 Click Score: 19    End of Session Equipment Utilized During Treatment: Gait belt Activity Tolerance: Patient tolerated treatment well;Other (comment) (can rule out orthostatics as a source of dizziness) Patient left: in bed;with call bell/phone within reach Nurse Communication: Mobility status PT Visit Diagnosis: Ataxic gait (R26.0);Muscle weakness (generalized) (M62.81);Other abnormalities of gait and mobility (R26.89);Other symptoms and signs involving the nervous system (R29.898);Dizziness and giddiness (R42)     Time: 1412-1500 PT Time Calculation (min) (ACUTE ONLY): 48 min  Charges:    $Therapeutic Activity: 8-22 mins $Self Care/Home Management: 23-37 PT General Charges $$ ACUTE PT VISIT: 1 Visit                     Silvano Currier, PT  Acute Rehabilitation Services Office [phone] Secure Chat welcomed    Silvano VEAR Currier 01/04/2024, 5:09 PM

## 2024-01-04 NOTE — Plan of Care (Signed)
  Problem: Education: Goal: Knowledge of General Education information will improve Description: Including pain rating scale, medication(s)/side effects and non-pharmacologic comfort measures Outcome: Progressing   Problem: Health Behavior/Discharge Planning: Goal: Ability to manage health-related needs will improve Outcome: Progressing   Problem: Clinical Measurements: Goal: Ability to maintain clinical measurements within normal limits will improve Outcome: Progressing Goal: Will remain free from infection Outcome: Progressing Goal: Diagnostic test results will improve Outcome: Progressing Goal: Respiratory complications will improve Outcome: Progressing Goal: Cardiovascular complication will be avoided Outcome: Progressing   Problem: Clinical Measurements: Goal: Will remain free from infection Outcome: Progressing   Problem: Clinical Measurements: Goal: Diagnostic test results will improve Outcome: Progressing   Problem: Clinical Measurements: Goal: Respiratory complications will improve Outcome: Progressing   Problem: Clinical Measurements: Goal: Cardiovascular complication will be avoided Outcome: Progressing   Problem: Activity: Goal: Risk for activity intolerance will decrease Outcome: Progressing   Problem: Nutrition: Goal: Adequate nutrition will be maintained Outcome: Progressing   Problem: Coping: Goal: Level of anxiety will decrease Outcome: Progressing   Problem: Elimination: Goal: Will not experience complications related to bowel motility Outcome: Progressing Goal: Will not experience complications related to urinary retention Outcome: Progressing   Problem: Pain Managment: Goal: General experience of comfort will improve and/or be controlled Outcome: Progressing   Problem: Skin Integrity: Goal: Risk for impaired skin integrity will decrease Outcome: Progressing

## 2024-01-04 NOTE — Plan of Care (Signed)
?  Problem: Clinical Measurements: ?Goal: Will remain free from infection ?Outcome: Progressing ?Goal: Diagnostic test results will improve ?Outcome: Progressing ?  ?Problem: Activity: ?Goal: Risk for activity intolerance will decrease ?Outcome: Progressing ?  ?Problem: Coping: ?Goal: Level of anxiety will decrease ?Outcome: Progressing ?  ?

## 2024-01-05 ENCOUNTER — Other Ambulatory Visit (HOSPITAL_COMMUNITY): Payer: Self-pay

## 2024-01-05 NOTE — Plan of Care (Signed)
 Pt doing well overnight. No issues to report. Pt hoping to go home tomorrow. States she didn't go yesterday as she was dizzy when PT working with her in hallway. Vitals stable. No prn's needed.   Problem: Education: Goal: Knowledge of General Education information will improve Description: Including pain rating scale, medication(s)/side effects and non-pharmacologic comfort measures Outcome: Progressing   Problem: Clinical Measurements: Goal: Ability to maintain clinical measurements within normal limits will improve Outcome: Progressing Goal: Will remain free from infection Outcome: Progressing Goal: Diagnostic test results will improve Outcome: Progressing Goal: Respiratory complications will improve Outcome: Progressing Goal: Cardiovascular complication will be avoided Outcome: Progressing

## 2024-01-05 NOTE — TOC CM/SW Note (Signed)
 Transition of Care [facility]) - Inpatient Brief Assessment   Patient Details  Name: Sharah N Julin MRN: [id number] Date of Birth: 06-21-1995  Transition of Care [facility]) CM/SW Contact:    Lauraine FORBES Saa, LCSWA Phone Number: 01/05/2024, 9:37 AM   Clinical Narrative:  9:37 AM Per chart review, patient resides at home with spouse. Patient does not have a PCP or insurance. CSW consulted RNCM and financial counseling for further assistance. Patient does not have SNF or HH history. Patient has DME (tub bench, BSC, rolling walker) history with [employer]. Patient has OPPT (Neuro) history with [facility]. Physical therapy  recommended patient discharge with outpatient physical therapy (neuro). TOC will continue to follow and be available to assist.  Transition of Care Asessment: Insurance and Status: Selfpay Patient has primary care physician: No Home environment has been reviewed: Private Residence Prior level of function:: Independent/Modified Independent Prior/Current Home Services: No current home services Social Drivers of Health Review: SDOH reviewed no interventions necessary Readmission risk has been reviewed: Yes (Currently Green 9%) Transition of care needs: transition of care needs identified, TOC will continue to follow

## 2024-01-05 NOTE — Progress Notes (Signed)
 Physical Therapy Treatment Patient Details Name: Kelly Benson MRN: [id number] DOB: 1996-05-07 Today's Date: 01/05/2024   History of Present Illness Pt is a 28 y.o. female admitted 8/22 with MS flareup. PMH: MS, scoliosis    PT Comments  Pt admitted with above diagnosis. Pt treated for right BPPV via Epley maneuver with decr symptoms after treatment. Went to print exercises and when PT returned MD in room therefore will come back to educate pt as to exercises.   Pt currently with functional limitations due to the deficits listed below (see PT Problem List). Pt will benefit from acute skilled PT to increase their independence and safety with mobility to allow discharge.       If plan is discharge home, recommend the following: A little help with walking and/or transfers;A little help with bathing/dressing/bathroom;Help with stairs or ramp for entrance;Assist for transportation   Can travel by private vehicle        Equipment Recommendations  None recommended by PT (Pt stated she has a rollator at home)    Recommendations for Other Services       Precautions / Restrictions Precautions Precautions: Fall Recall of Precautions/Restrictions: Intact Restrictions Weight Bearing Restrictions Per Provider Order: No     Mobility  Bed Mobility Overal bed mobility: Modified Independent             General bed mobility comments: increased time    Transfers Overall transfer level: Needs assistance   Transfers: Sit to/from Stand Sit to Stand: Contact guard assist   Step pivot transfers: Contact guard assist       General transfer comment: CGA to steady    Ambulation/Gait Ambulation/Gait assistance: Contact guard assist, Min assist Gait Distance (Feet): 20 Feet Assistive device: Rolling walker (2 wheels) Gait Pattern/deviations: Step-through pattern, Decreased stride length, Drifts right/left   Gait velocity interpretation: <1.8 ft/sec, indicate of risk for recurrent  falls   General Gait Details: Pt steady with device   Stairs             Wheelchair Mobility     Tilt Bed    Modified Rankin (Stroke Patients Only)       Balance Overall balance assessment: Needs assistance Sitting-balance support: Feet supported, No upper extremity supported Sitting balance-Leahy Scale: Good     Standing balance support: During functional activity, Bilateral upper extremity supported Standing balance-Leahy Scale: Poor Standing balance comment: Pt needs UE support during mobility                            Communication Communication Communication: No apparent difficulties  Cognition Arousal: Alert Behavior During Therapy: WFL for tasks assessed/performed   PT - Cognitive impairments: No apparent impairments                         Following commands: Intact      Cueing Cueing Techniques: Verbal cues  Exercises      General Comments General comments (skin integrity, edema, etc.): Pt positive for right BPPV and treated pt with Epley maneuver. Pt appreciative and reports dizziness improved after session.      Pertinent Vitals/Pain Pain Assessment Pain Assessment: No/denies pain    Home Living                          Prior Function            PT Goals (current  goals can now be found in the care plan section) Acute Rehab PT Goals Patient Stated Goal: return to work Progress towards PT goals: Progressing toward goals    Frequency    Min 2X/week      PT Plan      Co-evaluation              AM-PAC PT 6 Clicks Mobility   Outcome Measure  Help needed turning from your back to your side while in a flat bed without using bedrails?: None Help needed moving from lying on your back to sitting on the side of a flat bed without using bedrails?: None Help needed moving to and from a bed to a chair (including a wheelchair)?: A Little Help needed standing up from a chair using your arms  (e.g., wheelchair or bedside chair)?: A Little Help needed to walk in hospital room?: A Little Help needed climbing 3-5 steps with a railing? : A Lot 6 Click Score: 19    End of Session Equipment Utilized During Treatment: Gait belt Activity Tolerance: Patient tolerated treatment well Patient left: with call bell/phone within reach;in chair Nurse Communication: Mobility status PT Visit Diagnosis: Muscle weakness (generalized) (M62.81);Other abnormalities of gait and mobility (R26.89);Other symptoms and signs involving the nervous system (R29.898);Dizziness and giddiness (R42)     Time: 9081-9056 PT Time Calculation (min) (ACUTE ONLY): 25 min  Charges:    $Gait Training: 8-22 mins $Canalith Rep Proc: 8-22 mins PT General Charges $$ ACUTE PT VISIT: 1 Visit                     Lakiah Dhingra M,PT Acute Rehab Services [phone]    Stephane JULIANNA Bevel 01/05/2024, 11:32 AM

## 2024-01-05 NOTE — Progress Notes (Signed)
 Physical Therapy Treatment Patient Details Name: Kelly Benson MRN: [id number] DOB: 28-Jun-1995 Today's Date: 01/05/2024   History of Present Illness Pt is a 28 y.o. female admitted 8/22 with MS flareup. PMH: MS, scoliosis    PT Comments  Pt admitted with above diagnosis. Returned to educate pt regarding exercises for BPPV and pt understands.  Pt reports dizziness much better.  Pt agrees to f/u with Outpt PT for vestibular rehab.   Pt currently with functional limitations due to the deficits listed below (see PT Problem List). Pt will benefit from acute skilled PT to increase their independence and safety with mobility to allow discharge.       If plan is discharge home, recommend the following: A little help with walking and/or transfers;A little help with bathing/dressing/bathroom;Help with stairs or ramp for entrance;Assist for transportation   Can travel by private vehicle        Equipment Recommendations  None recommended by PT (Pt stated she has a rollator at home)    Recommendations for Other Services       Precautions / Restrictions Precautions Precautions: Fall Recall of Precautions/Restrictions: Intact Restrictions Weight Bearing Restrictions Per Provider Order: No     Mobility  Bed Mobility Overal bed mobility: Modified Independent             General bed mobility comments: increased time    Transfers Overall transfer level: Needs assistance   Transfers: Sit to/from Stand Sit to Stand: Contact guard assist   Step pivot transfers: Contact guard assist       General transfer comment: CGA to steady    Ambulation/Gait Ambulation/Gait assistance: Contact guard assist, Min assist Gait Distance (Feet): 20 Feet Assistive device: Rolling walker (2 wheels) Gait Pattern/deviations: Step-through pattern, Decreased stride length, Drifts right/left   Gait velocity interpretation: <1.8 ft/sec, indicate of risk for recurrent falls   General Gait Details: Pt  steady with device   Stairs             Wheelchair Mobility     Tilt Bed    Modified Rankin (Stroke Patients Only)       Balance Overall balance assessment: Needs assistance Sitting-balance support: Feet supported, No upper extremity supported Sitting balance-Leahy Scale: Good     Standing balance support: During functional activity, Bilateral upper extremity supported Standing balance-Leahy Scale: Poor Standing balance comment: Pt needs UE support during mobility                            Communication Communication Communication: No apparent difficulties  Cognition Arousal: Alert Behavior During Therapy: WFL for tasks assessed/performed   PT - Cognitive impairments: No apparent impairments                         Following commands: Intact      Cueing Cueing Techniques: Verbal cues  Exercises Other Exercises Other Exercises: Gave [employer] handout [id number] regarding BPPV and Gaze stability exercises. Other Exercises: Educated pt to perform self Epley and Wilhelmena Carrel until symptoms with these exercises resolve.  She may need to progress to Gaze stability exercises and gave handout for future if needed and suggest f/u at Outpt PT. (    General Comments General comments (skin integrity, edema, etc.): Pt positive for right BPPV and treated pt with Epley maneuver. Pt appreciative and reports dizziness improved after session.      Pertinent Vitals/Pain Pain Assessment Pain Assessment: No/denies  pain    Home Living                          Prior Function            PT Goals (current goals can now be found in the care plan section) Acute Rehab PT Goals Patient Stated Goal: return to work Progress towards PT goals: Progressing toward goals    Frequency    Min 2X/week      PT Plan      Co-evaluation              AM-PAC PT 6 Clicks Mobility   Outcome Measure  Help needed turning from your back to  your side while in a flat bed without using bedrails?: None Help needed moving from lying on your back to sitting on the side of a flat bed without using bedrails?: None Help needed moving to and from a bed to a chair (including a wheelchair)?: A Little Help needed standing up from a chair using your arms (e.g., wheelchair or bedside chair)?: A Little Help needed to walk in hospital room?: A Little Help needed climbing 3-5 steps with a railing? : A Lot 6 Click Score: 19    End of Session Equipment Utilized During Treatment: Gait belt Activity Tolerance: Patient tolerated treatment well Patient left: with call bell/phone within reach;in chair Nurse Communication: Mobility status PT Visit Diagnosis: Muscle weakness (generalized) (M62.81);Other abnormalities of gait and mobility (R26.89);Other symptoms and signs involving the nervous system (R29.898);Dizziness and giddiness (R42)     Time: 8985-8974 PT Time Calculation (min) (ACUTE ONLY): 11 min  Charges:     $Therapeutic Exercise: 8-22 mins PT General Charges $$ ACUTE PT VISIT: 1 Visit                     Kenneth Lax M,PT Acute Rehab Services [phone]    Kelly Benson 01/05/2024, 11:37 AM

## 2024-01-05 NOTE — Progress Notes (Signed)
 Occupational Therapy Treatment Patient Details Name: Kelly Benson MRN: [id number] DOB: [birth year] Today's Date: 01/05/2024   History of present illness Pt is a 28 y.o. female admitted 8/22 with MS flareup. PMH: MS, scoliosis   OT comments  Patient seated in recliner upon entry. HEP for therapy exercises performed with patient requiring min verbal cues to perform exercises correctly.  Squeeze ball provided to increase grip strength and instruction on use to also address pinch strength. Functional mobility and balance tasks performed with patient requiring CGA for safety.  Discharge recommendations continue to be appropriate. Acute OT to continue to follow to address established goals.       If plan is discharge home, recommend the following:  A little help with walking and/or transfers;A little help with bathing/dressing/bathroom;Assist for transportation;Assistance with cooking/housework;Help with stairs or ramp for entrance   Equipment Recommendations  Other (comment) (Pt to look at tub seat/bench from outside source)    Recommendations for Other Services      Precautions / Restrictions Precautions Precautions: Fall Recall of Precautions/Restrictions: Intact Restrictions Weight Bearing Restrictions Per Provider Order: No       Mobility Bed Mobility Overal bed mobility: Modified Independent             General bed mobility comments: OOB in recliner    Transfers Overall transfer level: Needs assistance Equipment used: Rolling walker (2 wheels) Transfers: Sit to/from Stand, Bed to chair/wheelchair/BSC Sit to Stand: Contact guard assist     Step pivot transfers: Contact guard assist     General transfer comment: CGA to steady     Balance Overall balance assessment: Needs assistance Sitting-balance support: Feet supported, No upper extremity supported Sitting balance-Leahy Scale: Good     Standing balance support: During functional activity, Bilateral upper  extremity supported Standing balance-Leahy Scale: Poor Standing balance comment: needs UE support                           ADL either performed or assessed with clinical judgement   ADL Overall ADL's : Needs assistance/impaired                                       General ADL Comments: focused on fine motor exercises with putty and squeeze ball    Extremity/Trunk Assessment Upper Extremity Assessment Upper Extremity Assessment: RUE deficits/detail;LUE deficits/detail (generalized weakness with grip and pinch strenghening) RUE Coordination: decreased gross motor;decreased fine motor LUE Deficits / Details: generalized weakness LUE Coordination: decreased fine motor;decreased gross motor            Diplomatic Services operational officer Communication Communication: No apparent difficulties   Cognition Arousal: Alert Behavior During Therapy: WFL for tasks assessed/performed                                 Following commands: Intact        Cueing   Cueing Techniques: Verbal cues  Exercises Exercises: Other exercises Other Exercises Other Exercises: red therapy putty exercises following HEP to increase grip and pinch strengthening Other Exercises: Squeeze ball provided to increase grip strength    Shoulder Instructions       General Comments stated dcreased dizziness following PT treatment    Pertinent  Vitals/ Pain       Pain Assessment Pain Assessment: No/denies pain  Home Living                                          Prior Functioning/Environment              Frequency  Min 2X/week        Progress Toward Goals  OT Goals(current goals can now be found in the care plan section)  Progress towards OT goals: Progressing toward goals  Acute Rehab OT Goals Patient Stated Goal: to increase her independence OT Goal Formulation: With patient Time For Goal Achievement:  01/17/24 Potential to Achieve Goals: Good ADL Goals Pt Will Perform Grooming: with modified independence;standing (2-3 tasks standing at the sink) Pt Will Perform Lower Body Bathing: with modified independence;sit to/from stand Pt Will Perform Lower Body Dressing: with modified independence;sit to/from stand Pt Will Transfer to Toilet: with modified independence;ambulating;grab bars Pt Will Perform Toileting - Clothing Manipulation and hygiene: with modified independence;sit to/from stand Pt Will Perform Tub/Shower Transfer: Tub transfer;with supervision;ambulating;tub bench;rolling walker Pt/caregiver will Perform Home Exercise Program: Increased strength;Both right and left upper extremity;With theraband;With theraputty;With written HEP provided;Independently Additional ADL Goal #1: Pt will independently verbalize at least 3 energy conservation strategies for selfcare tasks.  Plan      Co-evaluation                 AM-PAC OT 6 Clicks Daily Activity     Outcome Measure   Help from another person eating meals?: None Help from another person taking care of personal grooming?: A Little Help from another person toileting, which includes using toliet, bedpan, or urinal?: A Little Help from another person bathing (including washing, rinsing, drying)?: A Little Help from another person to put on and taking off regular upper body clothing?: A Little Help from another person to put on and taking off regular lower body clothing?: A Little 6 Click Score: 19    End of Session Equipment Utilized During Treatment: Gait belt;Rolling walker (2 wheels)  OT Visit Diagnosis: Unsteadiness on feet (R26.81);Other abnormalities of gait and mobility (R26.89);Repeated falls (R29.6);Muscle weakness (generalized) (M62.81)   Activity Tolerance Patient tolerated treatment well   Patient Left in chair;with call bell/phone within reach;with chair alarm set   Nurse Communication Mobility status         Time: 8940-8853 OT Time Calculation (min): 47 min  Charges: OT General Charges $OT Visit: 1 Visit OT Treatments $Therapeutic Activity: 8-22 mins $Therapeutic Exercise: 23-37 mins  Dick Benson, OTA Acute Rehabilitation Services  Office [phone]   Kelly Benson 01/05/2024, 1:06 PM

## 2024-01-05 NOTE — Discharge Summary (Signed)
 Physician Discharge Summary   Patient: Kelly Benson MRN: [id number] DOB: 08-09-1995  Admit date:     01/02/2024  Discharge date: 01/05/24  Discharge Physician: Lonni SHAUNNA Dalton   PCP: Patient, No Pcp Per   Recommendations at discharge:   Follow up with Neurology Dr. Vear     Discharge Diagnoses: Principal Problem:   Multiple sclerosis exacerbation [facility])   Hospital Course: 28 y.o. F with MS presented with increased ataxia despite outpatient high dose prednisone .  Neurology recommended admission for MRI brain and IV Solumedrol.  She was treated with steroids, to complete 5 days.  MRI brain was obtained and reviewed by Neurology and no new findings were noted other than progression of MS.  Evaluated by PT and equipment recommendations were made.  Discharged with Neuro follow up.        Consultants: Neurology Procedures performed: MRI brain  Disposition: Home Diet recommendation:  Discharge Diet Orders (From admission, onward)     Start     Ordered   01/05/24 0000  Diet - low sodium heart healthy        01/05/24 1135            DISCHARGE MEDICATION: Allergies as of 01/05/2024   No Known Allergies      Medication List     STOP taking these medications    predniSONE  50 MG tablet Commonly known as: DELTASONE        TAKE these medications    B-12 PO Take 1 tablet by mouth daily. Dosage unknown   folic acid  1 MG tablet Commonly known as: FOLVITE  Take 1 mg by mouth daily.   MAGNESIUM PO Take 1 tablet by mouth daily. Dosage unknown   OMEGA 3 FISH OIL PO Take 1 capsule by mouth daily. Dosage unknown   sertraline  50 MG tablet Commonly known as: Zoloft  Take 1 tablet (50 mg total) by mouth daily.   VITAMIN C PO Take 1 tablet by mouth daily. Dosage unknown   Vumerity  231 MG Cpdr Generic drug: Diroximel Fumarate  TAKE 1 CAPSULE (231MG ) BY MOUTH TWICE DAILY FOR THE FIRST 7 DAYS, THEN TAKE 2 CAPSULES (462MG ) TWICE DAILY THEREAFTER         Follow-up Information     [facility] Follow up.   Specialty: Rehabilitation Why: Outpatient Physical Therapy-office to call with visit times. If the office does not call within 3-5 business days please call the office. Contact information: [address] [place]  [address] [phone]        [facility] Charlie LABOR, MD Follow up.   Specialty: Neurology Contact information: [address] [place] [address] [phone]         [facility] Follow up.   Why: Rollator- to be delivered to the room. Contact information: [address] [place] [address] [phone]                Discharge Exam: There were no vitals filed for this visit. General: Pt is alert, awake, not in acute distress Cardiovascular: RRR, nl S1-S2, no murmurs appreciated.   No LE edema.   Respiratory: Normal respiratory rate and rhythm.  CTAB without rales or wheezes. Abdominal: Abdomen soft and non-tender.  No distension or HSM.   Neuro/Psych: Strength symmetric in upper and lower extremities.  Judgment and insight appear normal.   Condition at discharge: fair  The results of significant diagnostics from this hospitalization (including imaging, microbiology, ancillary and laboratory) are listed below for reference.  Imaging Studies: MR Brain W and Wo Contrast Result Date: 01/02/2024 CLINICAL DATA:  Neuro deficit, acute, stroke suspected; Myelopathy, acute, cervical spine; Myelopathy, acute, thoracic spine EXAM: MRI HEAD WITHOUT AND WITH CONTRAST MRI CERVICAL SPINE WITHOUT AND WITH CONTRAST MRI CERVICAL THORACIC WITHOUT AND CONTRAST CONTRAST:  8mL GADAVIST  GADOBUTROL  1 MMOL/ML IV SOLN TECHNIQUE: Multiplanar, multiecho pulse sequences of the brain and surrounding structures, and cervical and thoracic spine were obtained without and with intravenous contrast. COMPARISON:  MRI head September 02, 2015. FINDINGS: MRI HEAD FINDINGS Brain:  Significantly age advanced predominately periventricular T2/high FLAIR hyperintensities in the white matter, compatible with chronic demyelination. There also lesions within the brainstem and middle cerebellar peduncles. Many of the lesions are new in comparison to the prior, compatible with progression. No abnormal enhancement. No evidence of acute infarct, acute hemorrhage, mass lesion, midline shift or hydrocephalus. Vascular: Normal flow voids. Skull and upper cervical spine: Normal marrow signal. Sinuses/Orbits: Negative. MRI CERVICAL AND THORACIC SPINE FINDINGS Alignment: No substantial sagittal subluxation. Vertebrae: No fracture, evidence of discitis, or bone lesion. Cord: Redemonstrated T2 hyperintense short-segment cord hyperintensity at C2 with new lesions identified at C4-C5 and C5-C6. Subtle chronic hyperintensity in the upper thoracic cord without convincing cord signal abnormality in the remainder of the thoracic cord. Motion limited without definite enhancement. Posterior Fossa, vertebral arteries, paraspinal tissues: Negative. Disc levels: No significant canal or foraminal stenosis. IMPRESSION: In comparison to 2017, progressive intracranial chronic demyelination both intracranially and in the cervical cord as detailed above. No evidence of active demyelination. Electronically Signed   By: Gilmore GORMAN Molt M.D.   On: 01/02/2024 21:06   MR Cervical Spine W and Wo Contrast Result Date: 01/02/2024 CLINICAL DATA:  Neuro deficit, acute, stroke suspected; Myelopathy, acute, cervical spine; Myelopathy, acute, thoracic spine EXAM: MRI HEAD WITHOUT AND WITH CONTRAST MRI CERVICAL SPINE WITHOUT AND WITH CONTRAST MRI CERVICAL THORACIC WITHOUT AND CONTRAST CONTRAST:  8mL GADAVIST  GADOBUTROL  1 MMOL/ML IV SOLN TECHNIQUE: Multiplanar, multiecho pulse sequences of the brain and surrounding structures, and cervical and thoracic spine were obtained without and with intravenous contrast. COMPARISON:  MRI head September 02, 2015. FINDINGS: MRI HEAD FINDINGS Brain: Significantly age advanced predominately periventricular T2/high FLAIR hyperintensities in the white matter, compatible with chronic demyelination. There also lesions within the brainstem and middle cerebellar peduncles. Many of the lesions are new in comparison to the prior, compatible with progression. No abnormal enhancement. No evidence of acute infarct, acute hemorrhage, mass lesion, midline shift or hydrocephalus. Vascular: Normal flow voids. Skull and upper cervical spine: Normal marrow signal. Sinuses/Orbits: Negative. MRI CERVICAL AND THORACIC SPINE FINDINGS Alignment: No substantial sagittal subluxation. Vertebrae: No fracture, evidence of discitis, or bone lesion. Cord: Redemonstrated T2 hyperintense short-segment cord hyperintensity at C2 with new lesions identified at C4-C5 and C5-C6. Subtle chronic hyperintensity in the upper thoracic cord without convincing cord signal abnormality in the remainder of the thoracic cord. Motion limited without definite enhancement. Posterior Fossa, vertebral arteries, paraspinal tissues: Negative. Disc levels: No significant canal or foraminal stenosis. IMPRESSION: In comparison to 2017, progressive intracranial chronic demyelination both intracranially and in the cervical cord as detailed above. No evidence of active demyelination. Electronically Signed   By: Gilmore GORMAN Molt M.D.   On: 01/02/2024 21:06   MR THORACIC SPINE W WO CONTRAST Result Date: 01/02/2024 CLINICAL DATA:  Neuro deficit, acute, stroke suspected; Myelopathy, acute, cervical spine; Myelopathy, acute, thoracic spine EXAM: MRI HEAD WITHOUT AND WITH CONTRAST MRI CERVICAL SPINE WITHOUT AND WITH CONTRAST MRI CERVICAL THORACIC WITHOUT  AND CONTRAST CONTRAST:  8mL GADAVIST  GADOBUTROL  1 MMOL/ML IV SOLN TECHNIQUE: Multiplanar, multiecho pulse sequences of the brain and surrounding structures, and cervical and thoracic spine were obtained without and with  intravenous contrast. COMPARISON:  MRI head September 02, 2015. FINDINGS: MRI HEAD FINDINGS Brain: Significantly age advanced predominately periventricular T2/high FLAIR hyperintensities in the white matter, compatible with chronic demyelination. There also lesions within the brainstem and middle cerebellar peduncles. Many of the lesions are new in comparison to the prior, compatible with progression. No abnormal enhancement. No evidence of acute infarct, acute hemorrhage, mass lesion, midline shift or hydrocephalus. Vascular: Normal flow voids. Skull and upper cervical spine: Normal marrow signal. Sinuses/Orbits: Negative. MRI CERVICAL AND THORACIC SPINE FINDINGS Alignment: No substantial sagittal subluxation. Vertebrae: No fracture, evidence of discitis, or bone lesion. Cord: Redemonstrated T2 hyperintense short-segment cord hyperintensity at C2 with new lesions identified at C4-C5 and C5-C6. Subtle chronic hyperintensity in the upper thoracic cord without convincing cord signal abnormality in the remainder of the thoracic cord. Motion limited without definite enhancement. Posterior Fossa, vertebral arteries, paraspinal tissues: Negative. Disc levels: No significant canal or foraminal stenosis. IMPRESSION: In comparison to 2017, progressive intracranial chronic demyelination both intracranially and in the cervical cord as detailed above. No evidence of active demyelination. Electronically Signed   By: Gilmore GORMAN Molt M.D.   On: 01/02/2024 21:06    Microbiology: Results for orders placed or performed during the hospital encounter of 02/24/19  SARS CORONAVIRUS 2 (TAT 6-24 HRS) Nasopharyngeal Nasopharyngeal Swab     Status: None   Collection Time: 02/25/19  8:35 AM   Specimen: Nasopharyngeal Swab  Result Value Ref Range Status   SARS Coronavirus 2 NEGATIVE NEGATIVE Final    Comment: (NOTE) SARS-CoV-2 target nucleic acids are NOT DETECTED. The SARS-CoV-2 RNA is generally detectable in upper and  lower respiratory specimens during the acute phase of infection. Negative results do not preclude SARS-CoV-2 infection, do not rule out co-infections with other pathogens, and should not be used as the sole basis for treatment or other patient management decisions. Negative results must be combined with clinical observations, patient history, and epidemiological information. The expected result is Negative. Fact Sheet for Patients: HairSlick.no Fact Sheet for Healthcare Providers: quierodirigir.com This test is not yet approved or cleared by the [place]  FDA and  has been authorized for detection and/or diagnosis of SARS-CoV-2 by FDA under an Emergency Use Authorization (EUA). This EUA will remain  in effect (meaning this test can be used) for the duration of the COVID-19 declaration under Section 56 4(b)(1) of the Act, 21 U.S.C. section 360bbb-3(b)(1), unless the authorization is terminated or revoked sooner. Performed at [facility], [address], [place], [place] [address]     Labs: CBC: Recent Labs  Lab 01/02/24 1339  WBC 8.9  NEUTROABS 5.3  HGB 13.8  HCT 43.9  MCV 82.5  PLT 310   Basic Metabolic Panel: Recent Labs  Lab 01/02/24 1339  NA 142  K 3.3*  CL 106  CO2 26  GLUCOSE 105*  BUN 16  CREATININE 0.57  CALCIUM 8.6*  MG 1.9   Liver Function Tests: No results for input(s): AST, ALT, ALKPHOS, BILITOT, PROT, ALBUMIN in the last 168 hours. CBG: Recent Labs  Lab 01/02/24 1444  GLUCAP 108*    Discharge time spent: less than 30 minutes.  Signed: Lonni SHAUNNA Dalton, MD [facility] 01/05/2024

## 2024-01-05 NOTE — TOC CM/SW Note (Addendum)
 Transition of Care [facility]) - Inpatient Brief Assessment   Patient Details  Name: Kelly Benson MRN: [id number] Date of Birth: October 21, 1995  [facility]) CM/SW Contact:    Sudie Erminio Deems, RN Phone Number: 01/05/2024, 10:16 AM   Clinical Narrative: Case Manager spoke with patient and [facility] and she see's a provider at [facility]. Outpatient Physical Therapy referral submitted to [address] Office to call the patient with an appointment. Patient states she is working; however has no insurance at this time. Patient is appropriate for MATCH-letter to follow. Patient states she has transportation to appointments. Patient will go home via cab in the discharge lunge via cab if she cannot find family to transport. No further needs identified at this time.    1233 01-05-24 LOG submitted to [employer] for [employer]. Approved by leader Erminio Herring. DME to be delivered to the room. No further needs identified.    MATCH MEDICATION ASSISTANCE CARD Pharmacies please call [phone] for claim processing assistance.  Rx BIN: [id number] Rx Group: [id number] Rx PCN: [id number] Relationship Code: 1 Person Code: [id number]  Patient ID (MRN): [id number]    Patient Name: Kelly Benson   Patient DOB: May 29, 1995   Discharge Date: 01-05-24  Expiration Date: 01-13-24 (must be filled within 7 days of discharge)     Transition of Care Asessment: Insurance and Status: Selfpay Patient has primary care physician: Yes ([facility].) Home environment has been reviewed: Private Residence Prior level of function:: Independent/Modified Independent Prior/Current Home Services: No current home services Social Drivers of Health Review: SDOH reviewed no interventions necessary Readmission risk has been reviewed: Yes Transition of care needs: no transition of care needs at this time

## 2024-01-08 ENCOUNTER — Telehealth: Payer: Self-pay | Admitting: Neurology

## 2024-01-08 ENCOUNTER — Encounter: Payer: Self-pay | Admitting: Neurology

## 2024-01-08 NOTE — Telephone Encounter (Signed)
 Dr. Vear- ok to provide?

## 2024-01-08 NOTE — Telephone Encounter (Signed)
 Pt called to  request Letter from MD to return back to WORK .  Pt states she was Relase from ER and was inform the Neurologist will have to send release Letter .  Pt would like to be message back thorough Mychart with if MD can send that letter . Pt will not be able to make any Phone call today  after 1:00

## 2024-01-28 MED ORDER — VUMERITY 231 MG PO CPDR: 462.0000 mg | DELAYED_RELEASE_CAPSULE | Freq: Two times a day (BID) | ORAL | 1 refills | Status: AC

## 2024-01-28 NOTE — Addendum Note (Signed)
 Addended by: MINGO NEPTUNE A on: 01/28/2024 06:13 AM   Modules accepted: Orders

## 2024-02-02 ENCOUNTER — Telehealth: Payer: Self-pay | Admitting: Neurology

## 2024-02-02 NOTE — Telephone Encounter (Signed)
 Pt called to request to speak to Nurse about different treatment . Pt sates she has been in and out [facility];  and can barely walk the patient is concern and would like to discuss different treatment with MD.

## 2024-02-02 NOTE — Telephone Encounter (Signed)
 Called and spoke w/ pt. Relayed Dr. Obie message. She will proceed to ED for further evaluation/treatment.

## 2024-02-02 NOTE — Telephone Encounter (Signed)
 Dr. Buck-  You are WID this afternoon. Is there anything else you would recommend in the meantime?   Called pt at [phone].  MS DMT: Vumerity . Added Turmeric 1000mg  daily. Helped for a few days but sx worsened she felt. Last ED visit: [facility] 01/02/24-01/05/24.  Currently, legs giving out, very weak. Has had several near falls. Numbness in toes/tingling constant.  Denies any illness/infection.   On Vumerity , denies any missed doses. Having light sensitivity. No current blurry/double vision. Had MRI's 12/2023 in hospital. Told no active lesions but showed new lesions compared to 2020.  Having hard time getting documents for financial assistance (tax documents) for Kesimpta turned in.  Husband works during the day and unable to help much. She is going to send documents to [facility] (medical records here at [facility]) for us  to turn in for her. She would like to move forward with getting on Kesimpta.   She has not turned in [facility] financial assistance paperwork that we provided her 12/24/23. Expressed importance of completing/turning in at her earliest convenience.

## 2024-02-02 NOTE — Telephone Encounter (Signed)
 If she feels acutely worse with regards to strength and having had falls or near falls, I would recommend she proceed to the emergency room.

## 2024-02-08 ENCOUNTER — Ambulatory Visit (HOSPITAL_COMMUNITY)
Admission: EM | Admit: 2024-02-08 | Discharge: 2024-02-08 | Disposition: A | Discharge status: Home / Self Care | Attending: Nurse Practitioner | Admitting: Nurse Practitioner

## 2024-02-08 DIAGNOSIS — F32A Depression, unspecified: Secondary | ICD-10-CM | POA: Insufficient documentation

## 2024-02-08 DIAGNOSIS — Z630 Problems in relationship with spouse or partner: Secondary | ICD-10-CM | POA: Insufficient documentation

## 2024-02-08 NOTE — Discharge Instructions (Signed)

## 2024-02-08 NOTE — Progress Notes (Signed)
   02/08/24 1438  [facility] Triage Screening (Walk-ins at [facility] only)  How Did You Hear About Us ? Self  What Is the Reason for Your Visit/Call Today? Stucki presents to [facility] unaccompanied. Pt states that she is looking for a therapist at this time. Pt mentions she is going through a lot and would like someone to talk to. Pt states that she is feeling depressed and anxiety. Pt states she is taking Setraline for roughly a month. Pt denies substance use, Si, HI and AVH. Pt is calm, cooperative throughout triage.  How Long Has This Been Causing You Problems? <Week  Have You Recently Had Any Thoughts About Hurting Yourself? No  Are You Planning to Commit Suicide/Harm Yourself At This time? No  Have you Recently Had Thoughts About Hurting Someone Sherral? No  Are You Planning To Harm Someone At This Time? No  Physical Abuse Denies  Verbal Abuse Denies  Sexual Abuse Denies  Exploitation of patient/patient's resources Denies  Self-Neglect Denies  Possible abuse reported to: Other (Comment)  Are you currently experiencing any auditory, visual or other hallucinations? No  Have You Used Any Alcohol or Drugs in the Past 24 Hours? No  Do you have any current medical co-morbidities that require immediate attention? No  What Do You Feel Would Help You the Most Today? Treatment for Depression or other mood problem  If access to [facility] was not available, would you have sought care in the Emergency Department? No  Determination of Need Routine (7 days)  Options For Referral Outpatient Therapy

## 2024-02-08 NOTE — ED Provider Notes (Signed)
 Behavioral Health Urgent Care Medical Screening Exam  Patient Name: Kelly Benson MRN: [id number] Date of Evaluation: 02/08/24 Chief Complaint:  needing a therapy session Diagnosis:  Final diagnoses:  Marital problems   History of Present illness: Kelly Benson is a 28 y.o. female with no prior mental health diagnosis per her reports. She shares that she is here today looking for a therapy session for herself due to marital problems with her husband.  During encounter, pt presents with flat affect and depressed mood, attention to personal hygiene and grooming is fair, eye contact is good, speech is clear & coherent. Thought contents are organized and logical, and pt currently denies SI/HI/AVH or paranoia. There is no evidence of delusional thoughts. There are no overt signs of psychosis.   Patient denies suicide attempts in the past, denies self injurious behaviors, denies any immediate concerns related to her safety and states that she does not  take any psychotropic medications. She does not go into details regarding the nature of her marital problems.  Recommendations: We talked about the fact that therapy sessions are not provided at the Urgent care. We talked about the need to present back to this location tomorrow morning by 06:45 AM, but to take the elevators to the second floor tomorrow morning, pick up a clipboard, complete it and sit and wait to establish care. Patient verbalized understanding, stated that he will be back in the morning. States that she is not interested in medication management, not seeing any one for med management, but as per chart review, is on Zoloft .  Suicide Risk Assessment: Minimal: No identifiable suicidal ideation. No plans and no intent.  Flowsheet Row ED from 02/08/2024 in [facility] ED to Hosp-Admission (Discharged) from 01/02/2024 in [facility] GENERAL MED/SURG UNIT ED from 07/05/2023 in [facility]  C-SSRS RISK CATEGORY No Risk No Risk Low Risk    Psychiatric Specialty Exam  Presentation  General Appearance:Neat  Eye Contact:Fair  Speech:Clear and Coherent  Speech Volume:Normal  Handedness:Right   Mood and Affect  Mood: Depressed  Affect: Appropriate   Thought Process  Thought Processes: Coherent  Descriptions of Associations:Intact  Orientation:Full (Time, Place and Person)  Thought Content:Logical    Hallucinations:None  Ideas of Reference:None  Suicidal Thoughts:No Without Intent; Without Plan; Without Means to Carry Out  Homicidal Thoughts:No   Sensorium  Memory: Immediate Fair  Judgment: Fair  Insight: Fair   Art therapist  Concentration: Fair  Attention Span: Fair  Recall: [employer] of Knowledge: Fair  Language: Fair   Psychomotor Activity  Psychomotor Activity: Normal   Assets  Assets: Manufacturing systems engineer; Social Support   Sleep  Sleep: Good  Number of hours:  5   Physical Exam: Physical Exam Vitals and nursing note reviewed.  Musculoskeletal:        General: Normal range of motion.     Cervical back: Normal range of motion.  Neurological:     General: No focal deficit present.     Mental Status: She is oriented to person, place, and time.  Psychiatric:        Behavior: Behavior normal.        Thought Content: Thought content normal.        Judgment: Judgment normal.    Review of Systems  Psychiatric/Behavioral:  Positive for depression. Negative for hallucinations, memory loss, substance abuse and suicidal ideas. The patient is not nervous/anxious and does not have insomnia.   All  other systems reviewed and are negative.  Blood pressure (!) 142/92, pulse 79, temperature 98.6 F (37 C), temperature source Oral, resp. rate 20, SpO2 99%. There is no height or weight on file to calculate BMI.  Musculoskeletal: Strength & Muscle Tone: within normal limits Gait & Station:  normal Patient leans: N/A   [facility] MSE Discharge Disposition for Follow up and Recommendations: Based on my evaluation the patient does not appear to have an emergency medical condition and can be discharged with resources and follow up care in outpatient services for Medication Management and Individual Therapy  Follow up with [facility] Residents Only  Walk-in hours for open access (medication management and therapy) are Monday - Friday 8 am to 11 am. Appointments are limited, so please arrive at 06:45 am. Upon arrival, please complete the form on the clipboard located at the front desk. If there are no clipboards available, all appointments have been filled for that day.  [facility] [address] [place]  [id number] [phone]  Donia Snell, NP 02/08/2024, 3:53 PM

## 2024-02-08 NOTE — ED Notes (Signed)
 Patient discharged by provider.

## 2024-04-28 ENCOUNTER — Telehealth: Payer: Self-pay | Admitting: Neurology

## 2024-04-28 NOTE — Telephone Encounter (Signed)
 Pt called stating that she is trying to start [employer] and the IRS is requesting documentation. Pt is needing to discuss with Nurse. Please advise.

## 2024-04-29 NOTE — Telephone Encounter (Signed)
 Called and it was almost like someone picked up but didn't say anything. 1st attempt by hf 04/29/24

## 2024-04-29 NOTE — Telephone Encounter (Signed)
 Pt called and I was able to speak to her.  She has been trying to get her kesimpta p/w , financials as well for PAP to us , but she and her husband are not tech savvy, so is asking for our help.  I told her that are happy to help as much as we can.  If she has completed the paperwork on her end (gotten the financials needed) and we have done our providers part we can fax to them.  We are not here tomorrow after 1200, but next week mon, tues wed till 1200 are our hours. I told her that she can call and speak the PAP team and they are happy to assist her, just relay you are trying to get PAP for kesimpta and want to make sure she has everything they will need as not to make multiple trips. She was appreciative.

## 2024-07-28 ENCOUNTER — Ambulatory Visit: Payer: Self-pay | Admitting: Neurology
